# Patient Record
Sex: Female | Born: 1960
Health system: Southern US, Community
[De-identification: ages and names within clinical notes are randomized; demographics above are authoritative.]

## PROBLEM LIST (undated history)

## (undated) DIAGNOSIS — J9383 Other pneumothorax: Secondary | ICD-10-CM

## (undated) DIAGNOSIS — I10 Essential (primary) hypertension: Secondary | ICD-10-CM

## (undated) DIAGNOSIS — N390 Urinary tract infection, site not specified: Secondary | ICD-10-CM

## (undated) DIAGNOSIS — K219 Gastro-esophageal reflux disease without esophagitis: Secondary | ICD-10-CM

## (undated) DIAGNOSIS — M1990 Unspecified osteoarthritis, unspecified site: Secondary | ICD-10-CM

## (undated) DIAGNOSIS — F419 Anxiety disorder, unspecified: Secondary | ICD-10-CM

## (undated) HISTORY — PX: ABDOMINAL HYSTERECTOMY: SHX81

## (undated) HISTORY — PX: TUBAL LIGATION: SHX77

## (undated) HISTORY — DX: Essential (primary) hypertension: I10

## (undated) HISTORY — DX: Urinary tract infection, site not specified: N39.0

## (undated) HISTORY — DX: Anxiety disorder, unspecified: F41.9

## (undated) HISTORY — PX: BREAST SURGERY: SHX581

## (undated) HISTORY — PX: REDUCTION MAMMAPLASTY: SUR839

## (undated) HISTORY — DX: Other pneumothorax: J93.83

---

## 1995-06-06 HISTORY — PX: LUNG SURGERY: SHX703

## 2013-04-21 ENCOUNTER — Ambulatory Visit (INDEPENDENT_AMBULATORY_CARE_PROVIDER_SITE_OTHER): Payer: Self-pay | Admitting: Internal Medicine

## 2013-04-21 ENCOUNTER — Encounter: Payer: Self-pay | Admitting: Internal Medicine

## 2013-04-21 VITALS — BP 140/90 | HR 80 | Temp 97.9°F | Resp 16 | Ht 66.0 in | Wt 180.0 lb

## 2013-04-21 DIAGNOSIS — J9383 Other pneumothorax: Secondary | ICD-10-CM | POA: Insufficient documentation

## 2013-04-21 DIAGNOSIS — I10 Essential (primary) hypertension: Secondary | ICD-10-CM | POA: Insufficient documentation

## 2013-04-21 MED ORDER — TRIAMTERENE-HCTZ 37.5-25 MG PO CAPS: 1.0000 | ORAL_CAPSULE | Freq: Every day | ORAL | Status: DC

## 2013-04-21 MED ORDER — NORTRIPTYLINE HCL 10 MG PO CAPS: 10.0000 mg | ORAL_CAPSULE | Freq: Every day | ORAL | Status: DC

## 2013-04-21 MED ORDER — VITAMIN D 1000 UNITS PO TABS: 1000.0000 [IU] | ORAL_TABLET | Freq: Every day | ORAL | Status: AC

## 2013-04-21 NOTE — Assessment & Plan Note (Signed)
Chronic  Change Maxzide to 1 po qd

## 2013-04-21 NOTE — Progress Notes (Signed)
  Subjective:     HPI   The patient needs to address  chronic hypertension that has been well controlled with medicines Had a colonoscopy in 1999  Review of Systems  Constitutional: Negative for fever, chills, diaphoresis, activity change, appetite change, fatigue and unexpected weight change.  HENT: Negative for congestion, dental problem, ear pain, hearing loss, mouth sores, postnasal drip, sinus pressure, sneezing, sore throat and voice change.   Eyes: Negative for pain and visual disturbance.  Respiratory: Positive for cough. Negative for chest tightness, wheezing and stridor.   Cardiovascular: Negative for chest pain, palpitations and leg swelling.  Gastrointestinal: Negative for nausea, vomiting, abdominal pain, blood in stool, abdominal distention and rectal pain.  Genitourinary: Negative for dysuria, hematuria, decreased urine volume, vaginal bleeding, vaginal discharge, difficulty urinating, vaginal pain and menstrual problem.  Musculoskeletal: Negative for back pain, gait problem, joint swelling and neck pain.  Skin: Negative for color change, rash and wound.  Neurological: Negative for dizziness, tremors, syncope, speech difficulty and light-headedness.  Hematological: Negative for adenopathy.  Psychiatric/Behavioral: Positive for sleep disturbance. Negative for suicidal ideas, hallucinations, behavioral problems, confusion, dysphoric mood and decreased concentration. The patient is not nervous/anxious and is not hyperactive.        Objective:   Physical Exam  Constitutional: She appears well-developed. No distress.  HENT:  Head: Normocephalic.  Right Ear: External ear normal.  Left Ear: External ear normal.  Nose: Nose normal.  Mouth/Throat: Oropharynx is clear and moist.  Eyes: Conjunctivae are normal. Pupils are equal, round, and reactive to light. Right eye exhibits no discharge. Left eye exhibits no discharge.  Neck: Normal range of motion. Neck supple. No JVD  present. No tracheal deviation present. No thyromegaly present.  Cardiovascular: Normal rate, regular rhythm and normal heart sounds.   Pulmonary/Chest: No stridor. No respiratory distress. She has no wheezes.  Abdominal: Soft. Bowel sounds are normal. She exhibits no distension and no mass. There is no tenderness. There is no rebound and no guarding.  Musculoskeletal: She exhibits no edema and no tenderness.  Lymphadenopathy:    She has no cervical adenopathy.  Neurological: She displays normal reflexes. No cranial nerve deficit. She exhibits normal muscle tone. Coordination normal.  Skin: No rash noted. No erythema.  Psychiatric: She has a normal mood and affect. Her behavior is normal. Judgment and thought content normal.          Assessment & Plan:   Well exam/labs next year when Timira obtains health isurance

## 2013-04-21 NOTE — Assessment & Plan Note (Signed)
Juleen needs to stop smoking

## 2013-04-21 NOTE — Progress Notes (Signed)
Pre visit review using our clinic review tool, if applicable. No additional management support is needed unless otherwise documented below in the visit note. 

## 2013-08-18 ENCOUNTER — Ambulatory Visit: Payer: Self-pay | Admitting: Internal Medicine

## 2013-08-18 DIAGNOSIS — Z0289 Encounter for other administrative examinations: Secondary | ICD-10-CM

## 2013-10-23 ENCOUNTER — Other Ambulatory Visit: Payer: Self-pay | Admitting: Internal Medicine

## 2013-12-25 ENCOUNTER — Ambulatory Visit (INDEPENDENT_AMBULATORY_CARE_PROVIDER_SITE_OTHER): Payer: BC Managed Care – PPO | Admitting: Family Medicine

## 2013-12-25 ENCOUNTER — Encounter: Payer: Self-pay | Admitting: Family Medicine

## 2013-12-25 VITALS — BP 112/68 | HR 69 | Temp 98.9°F | Ht 66.0 in | Wt 178.0 lb

## 2013-12-25 DIAGNOSIS — J0100 Acute maxillary sinusitis, unspecified: Secondary | ICD-10-CM

## 2013-12-25 MED ORDER — FLUCONAZOLE 150 MG PO TABS: 150.0000 mg | ORAL_TABLET | Freq: Once | ORAL | Status: DC

## 2013-12-25 MED ORDER — AZITHROMYCIN 250 MG PO TABS: ORAL_TABLET | ORAL | Status: DC

## 2013-12-25 NOTE — Progress Notes (Signed)
   Subjective:    Patient ID: Tamara Charles, female    DOB: Nov 11, 1960, 53 y.o.   MRN: [id number]  HPI Here for 10 days of sinus pressure, PND, ST, and coughing up yellow sputum.    Review of Systems  Constitutional: Negative.   HENT: Positive for congestion, postnasal drip and sinus pressure.   Eyes: Negative.   Respiratory: Positive for cough.        Objective:   Physical Exam  Constitutional: She appears well-developed and well-nourished.  HENT:  Right Ear: External ear normal.  Left Ear: External ear normal.  Nose: Nose normal.  Mouth/Throat: Oropharynx is clear and moist.  Eyes: Conjunctivae are normal.  Pulmonary/Chest: Effort normal and breath sounds normal.  Lymphadenopathy:    She has no cervical adenopathy.          Assessment & Plan:  Add Mucinex

## 2013-12-25 NOTE — Progress Notes (Signed)
Pre visit review using our clinic review tool, if applicable. No additional management support is needed unless otherwise documented below in the visit note. 

## 2014-02-23 ENCOUNTER — Ambulatory Visit (INDEPENDENT_AMBULATORY_CARE_PROVIDER_SITE_OTHER)
Admission: RE | Admit: 2014-02-23 | Discharge: 2014-02-23 | Disposition: A | Discharge status: Home / Self Care | Payer: BC Managed Care – PPO | Source: Ambulatory Visit | Attending: Internal Medicine | Admitting: Internal Medicine

## 2014-02-23 ENCOUNTER — Ambulatory Visit (INDEPENDENT_AMBULATORY_CARE_PROVIDER_SITE_OTHER): Payer: BC Managed Care – PPO | Admitting: Internal Medicine

## 2014-02-23 ENCOUNTER — Encounter: Payer: Self-pay | Admitting: Internal Medicine

## 2014-02-23 VITALS — BP 130/78 | HR 77 | Temp 97.8°F | Ht 67.0 in | Wt 177.0 lb

## 2014-02-23 DIAGNOSIS — R011 Cardiac murmur, unspecified: Secondary | ICD-10-CM

## 2014-02-23 DIAGNOSIS — Z0000 Encounter for general adult medical examination without abnormal findings: Secondary | ICD-10-CM | POA: Insufficient documentation

## 2014-02-23 DIAGNOSIS — Z1239 Encounter for other screening for malignant neoplasm of breast: Secondary | ICD-10-CM

## 2014-02-23 DIAGNOSIS — J439 Emphysema, unspecified: Secondary | ICD-10-CM

## 2014-02-23 DIAGNOSIS — F17200 Nicotine dependence, unspecified, uncomplicated: Secondary | ICD-10-CM | POA: Insufficient documentation

## 2014-02-23 DIAGNOSIS — I10 Essential (primary) hypertension: Secondary | ICD-10-CM

## 2014-02-23 DIAGNOSIS — Z8249 Family history of ischemic heart disease and other diseases of the circulatory system: Secondary | ICD-10-CM

## 2014-02-23 MED ORDER — ASPIRIN EC 81 MG PO TBEC: 81.0000 mg | DELAYED_RELEASE_TABLET | Freq: Every day | ORAL | Status: DC

## 2014-02-23 MED ORDER — TRIAMTERENE-HCTZ 37.5-25 MG PO CAPS: 1.0000 | ORAL_CAPSULE | Freq: Every day | ORAL | Status: DC

## 2014-02-23 MED ORDER — NORTRIPTYLINE HCL 10 MG PO CAPS: ORAL_CAPSULE | ORAL | Status: DC

## 2014-02-23 NOTE — Progress Notes (Signed)
Pre visit review using our clinic review tool, if applicable. No additional management support is needed unless otherwise documented below in the visit note. 

## 2014-02-23 NOTE — Progress Notes (Signed)
   Subjective:    HPI    The patient is here for a wellness exam. The patient has been doing well overall without major physical or psychological issues going on lately.  .Review of Systems  Constitutional: Negative for chills, diaphoresis, activity change, appetite change, fatigue and unexpected weight change.  HENT: Negative for congestion, mouth sores and sinus pressure.   Eyes: Negative for visual disturbance.  Respiratory: Negative for cough, chest tightness, shortness of breath and wheezing.   Cardiovascular: Negative for chest pain and palpitations.  Gastrointestinal: Negative for nausea, vomiting, abdominal pain and diarrhea.  Genitourinary: Negative for dysuria, frequency, difficulty urinating and vaginal pain.  Musculoskeletal: Negative for back pain, gait problem and myalgias.  Skin: Negative for pallor and rash.  Neurological: Negative for dizziness, tremors, weakness, numbness and headaches.  Psychiatric/Behavioral: Negative for suicidal ideas, confusion, sleep disturbance and dysphoric mood.       Objective:   Physical Exam  Constitutional: She appears well-developed. No distress.  HENT:  Head: Normocephalic.  Right Ear: External ear normal.  Left Ear: External ear normal.  Nose: Nose normal.  Mouth/Throat: Oropharynx is clear and moist.  Eyes: Conjunctivae are normal. Pupils are equal, round, and reactive to light. Right eye exhibits no discharge. Left eye exhibits no discharge.  Neck: Normal range of motion. Neck supple. No JVD present. No tracheal deviation present. No thyromegaly present.  Cardiovascular: Normal rate, regular rhythm and normal heart sounds.   Pulmonary/Chest: No stridor. No respiratory distress. She has no wheezes.  Abdominal: Soft. Bowel sounds are normal. She exhibits no distension and no mass. There is no tenderness. There is no rebound and no guarding.  Musculoskeletal: She exhibits no edema and no tenderness.  Lymphadenopathy:    She  has no cervical adenopathy.  Neurological: She displays normal reflexes. No cranial nerve deficit. She exhibits normal muscle tone. Coordination normal.  Skin: No rash noted. No erythema.  Psychiatric: She has a normal mood and affect. Her behavior is normal. Judgment and thought content normal.   1/6 heart murmur    Assessment & Plan:

## 2014-02-23 NOTE — Assessment & Plan Note (Signed)
Discussed Chantix info

## 2014-02-23 NOTE — Assessment & Plan Note (Signed)
Continue with current prescription therapy as reflected on the Med list.  

## 2014-02-23 NOTE — Assessment & Plan Note (Signed)
Brother died of MI Smoking discussed ASA 81 mg Labs

## 2014-02-23 NOTE — Assessment & Plan Note (Addendum)
We discussed age appropriate health related issues, including available/recomended screening tests and vaccinations. We discussed a need for adhering to healthy diet and exercise. Labs/EKG were reviewed/ordered. All questions were answered. EKG Colonoscopy vs Cologuard discussed Mammogram Pt declined flu shot

## 2014-02-23 NOTE — Assessment & Plan Note (Addendum)
Remote H/o surgery CXR

## 2014-03-02 ENCOUNTER — Ambulatory Visit (HOSPITAL_COMMUNITY): Payer: BC Managed Care – PPO | Attending: Internal Medicine | Admitting: Radiology

## 2014-03-02 DIAGNOSIS — I10 Essential (primary) hypertension: Secondary | ICD-10-CM | POA: Diagnosis not present

## 2014-03-02 DIAGNOSIS — Z8249 Family history of ischemic heart disease and other diseases of the circulatory system: Secondary | ICD-10-CM

## 2014-03-02 DIAGNOSIS — R011 Cardiac murmur, unspecified: Secondary | ICD-10-CM | POA: Diagnosis not present

## 2014-03-02 DIAGNOSIS — F17200 Nicotine dependence, unspecified, uncomplicated: Secondary | ICD-10-CM | POA: Diagnosis not present

## 2014-03-02 NOTE — Progress Notes (Signed)
Echocardiogram performed.  

## 2014-03-03 ENCOUNTER — Other Ambulatory Visit: Payer: Self-pay | Admitting: Internal Medicine

## 2014-03-03 ENCOUNTER — Ambulatory Visit: Admission: RE | Admit: 2014-03-03 | Payer: BC Managed Care – PPO | Source: Ambulatory Visit

## 2014-03-03 ENCOUNTER — Ambulatory Visit
Admission: RE | Admit: 2014-03-03 | Discharge: 2014-03-03 | Disposition: A | Discharge status: Home / Self Care | Payer: BC Managed Care – PPO | Source: Ambulatory Visit | Attending: Internal Medicine | Admitting: Internal Medicine

## 2014-03-03 DIAGNOSIS — Z9889 Other specified postprocedural states: Secondary | ICD-10-CM

## 2014-03-03 DIAGNOSIS — Z1231 Encounter for screening mammogram for malignant neoplasm of breast: Secondary | ICD-10-CM

## 2014-03-05 ENCOUNTER — Other Ambulatory Visit: Payer: Self-pay | Admitting: Internal Medicine

## 2014-03-05 DIAGNOSIS — R928 Other abnormal and inconclusive findings on diagnostic imaging of breast: Secondary | ICD-10-CM

## 2014-03-09 ENCOUNTER — Telehealth: Payer: Self-pay | Admitting: Internal Medicine

## 2014-03-09 ENCOUNTER — Ambulatory Visit (INDEPENDENT_AMBULATORY_CARE_PROVIDER_SITE_OTHER): Payer: BC Managed Care – PPO | Admitting: Internal Medicine

## 2014-03-09 ENCOUNTER — Ambulatory Visit: Payer: BC Managed Care – PPO

## 2014-03-09 ENCOUNTER — Encounter: Payer: Self-pay | Admitting: Internal Medicine

## 2014-03-09 VITALS — BP 118/82 | HR 66 | Temp 98.2°F | Resp 14 | Wt 177.0 lb

## 2014-03-09 DIAGNOSIS — B029 Zoster without complications: Secondary | ICD-10-CM

## 2014-03-09 MED ORDER — FAMCICLOVIR 500 MG PO TABS: 500.0000 mg | ORAL_TABLET | Freq: Three times a day (TID) | ORAL | Status: DC

## 2014-03-09 MED ORDER — FAMCICLOVIR 500 MG PO TABS: 1000.0000 mg | ORAL_TABLET | Freq: Three times a day (TID) | ORAL | Status: DC

## 2014-03-09 NOTE — Progress Notes (Signed)
Pre visit review using our clinic review tool, if applicable. No additional management support is needed unless otherwise documented below in the visit note. 

## 2014-03-09 NOTE — Telephone Encounter (Signed)
[facility] called and said that pt is coming in at 3:15 today and needs referral signed by 12 today in order to do the exam for pt.   Best number is [phone]

## 2014-03-09 NOTE — Telephone Encounter (Signed)
Faxed order back to breast center...Tamara Charles

## 2014-03-09 NOTE — Telephone Encounter (Signed)
Done. Thx.

## 2014-03-09 NOTE — Progress Notes (Signed)
   Subjective:    Patient ID: Tamara Charles, female    DOB: 31-May-1961, 53 y.o.   MRN: [id number]  HPI   Symptoms began 03/07/14 while [place]. She noted a single vesicle the left forearm. Subsequently additional vesicles appeared on  the upper arm & the left lateral chest in the area of the breast.  She's also noted some itching in the left eye without associated discharge or vision change.  She does have a history of severe chickenpox as a child. She has not had not had the shingles vaccine.    Review of Systems   She specifically denies fever, chills, or sweats.  There's been no purulent drainage from the eye or blurred vision, double vision, loss of vision.      Objective:   Physical Exam   Pertinent positive findings include: She has several small vesicles over the left ventral arm There are also several small papules over the left lateral breast area.  Pupils equally round reactive to light. Extraocular motion is intact.  She has no lymphadenopathy about the neck , axilla, or epitrochlear areas Regular rhythm without murmurs or gallops Chest is clear with no increased work of breathing.        Assessment & Plan:  #1 thoracic herpes zoster #2 allergic conjunctivitis See orders and after visit summary  The pathophysiology of herpes zoster infection was discussed. Restrictions were also delineated. Medications prescribed and indications discussed.

## 2014-03-09 NOTE — Patient Instructions (Signed)
Please avoid direct exposure to individuals who are immunosuppressed such as cancer patient on chemotherapy or HIV patients.  After having herpes zoster or shingles;  a natural immunity against recurrent shingles should be present for approximately 24 months. After that time shingles immunization would be indicated. I

## 2014-03-09 NOTE — Telephone Encounter (Signed)
Dr. Alain Marion you have to go in and sign the order for ultrasound to be done...Tamara Charles

## 2014-03-09 NOTE — Telephone Encounter (Signed)
Ok ref [employer]

## 2014-03-09 NOTE — Telephone Encounter (Signed)
[place] or a US biopsy? Thx

## 2014-03-09 NOTE — Telephone Encounter (Signed)
Called the breast center to clarify order that md need to sign. Spoke with Noell she stated they fax over md a order to have diagnostic mammogram & ultrasound. Will check with stacey to check fax's from last week...Johny Chess

## 2014-03-10 ENCOUNTER — Ambulatory Visit
Admission: RE | Admit: 2014-03-10 | Discharge: 2014-03-10 | Disposition: A | Discharge status: Home / Self Care | Payer: BC Managed Care – PPO | Source: Ambulatory Visit | Attending: Internal Medicine | Admitting: Internal Medicine

## 2014-03-10 ENCOUNTER — Telehealth: Payer: Self-pay | Admitting: Internal Medicine

## 2014-03-10 DIAGNOSIS — R928 Other abnormal and inconclusive findings on diagnostic imaging of breast: Secondary | ICD-10-CM

## 2014-03-10 NOTE — Telephone Encounter (Signed)
emmi mailed  °

## 2014-04-21 ENCOUNTER — Other Ambulatory Visit: Payer: Self-pay | Admitting: Internal Medicine

## 2014-08-25 ENCOUNTER — Encounter: Payer: Self-pay | Admitting: Internal Medicine

## 2014-08-25 ENCOUNTER — Other Ambulatory Visit (INDEPENDENT_AMBULATORY_CARE_PROVIDER_SITE_OTHER): Payer: BLUE CROSS/BLUE SHIELD

## 2014-08-25 ENCOUNTER — Ambulatory Visit (INDEPENDENT_AMBULATORY_CARE_PROVIDER_SITE_OTHER): Payer: BLUE CROSS/BLUE SHIELD | Admitting: Internal Medicine

## 2014-08-25 VITALS — BP 150/98 | HR 79 | Wt 180.0 lb

## 2014-08-25 DIAGNOSIS — I10 Essential (primary) hypertension: Secondary | ICD-10-CM

## 2014-08-25 DIAGNOSIS — R635 Abnormal weight gain: Secondary | ICD-10-CM | POA: Diagnosis not present

## 2014-08-25 DIAGNOSIS — N951 Menopausal and female climacteric states: Secondary | ICD-10-CM | POA: Diagnosis not present

## 2014-08-25 LAB — CBC WITH DIFFERENTIAL/PLATELET
BASOS PCT: 0.4 % (ref 0.0–3.0)
Basophils Absolute: 0 10*3/uL (ref 0.0–0.1)
EOS PCT: 2.6 % (ref 0.0–5.0)
Eosinophils Absolute: 0.2 10*3/uL (ref 0.0–0.7)
HCT: 40.8 % (ref 36.0–46.0)
Hemoglobin: 14.1 g/dL (ref 12.0–15.0)
Lymphocytes Relative: 29.8 % (ref 12.0–46.0)
Lymphs Abs: 2 10*3/uL (ref 0.7–4.0)
MCHC: 34.6 g/dL (ref 30.0–36.0)
MCV: 93.3 fl (ref 78.0–100.0)
Monocytes Absolute: 0.5 10*3/uL (ref 0.1–1.0)
Monocytes Relative: 7.2 % (ref 3.0–12.0)
Neutro Abs: 3.9 10*3/uL (ref 1.4–7.7)
Neutrophils Relative %: 60 % (ref 43.0–77.0)
PLATELETS: 357 10*3/uL (ref 150.0–400.0)
RBC: 4.38 Mil/uL (ref 3.87–5.11)
RDW: 12.6 % (ref 11.5–15.5)
WBC: 6.6 10*3/uL (ref 4.0–10.5)

## 2014-08-25 LAB — BASIC METABOLIC PANEL
BUN: 17 mg/dL (ref 6–23)
CALCIUM: 9.8 mg/dL (ref 8.4–10.5)
CO2: 32 mEq/L (ref 19–32)
Chloride: 100 mEq/L (ref 96–112)
Creatinine, Ser: 0.74 mg/dL (ref 0.40–1.20)
GFR: 87.53 mL/min (ref >60.00)
Glucose, Bld: 103 mg/dL — ABNORMAL HIGH (ref 70–99)
Potassium: 3.7 mEq/L (ref 3.5–5.1)
SODIUM: 136 meq/L (ref 135–145)

## 2014-08-25 LAB — URINALYSIS, ROUTINE W REFLEX MICROSCOPIC
Bilirubin Urine: NEGATIVE
HGB URINE DIPSTICK: NEGATIVE
Ketones, ur: NEGATIVE
LEUKOCYTES UA: NEGATIVE
NITRITE: NEGATIVE
PH: 6 (ref 5.0–8.0)
RBC / HPF: NONE SEEN (ref 0–?)
SPECIFIC GRAVITY, URINE: 1.025 (ref 1.000–1.030)
TOTAL PROTEIN, URINE-UPE24: NEGATIVE
URINE GLUCOSE: NEGATIVE
Urobilinogen, UA: 0.2 (ref 0.0–1.0)

## 2014-08-25 LAB — FOLLICLE STIMULATING HORMONE: FSH: 60.5 m[IU]/mL

## 2014-08-25 LAB — HEPATIC FUNCTION PANEL
ALBUMIN: 4.5 g/dL (ref 3.5–5.2)
ALT: 13 U/L (ref 0–35)
AST: 15 U/L (ref 0–37)
Alkaline Phosphatase: 51 U/L (ref 39–117)
BILIRUBIN TOTAL: 0.4 mg/dL (ref 0.2–1.2)
Bilirubin, Direct: 0 mg/dL (ref 0.0–0.3)
TOTAL PROTEIN: 7.1 g/dL (ref 6.0–8.3)

## 2014-08-25 LAB — LIPID PANEL
CHOLESTEROL: 234 mg/dL — AB (ref 0–200)
HDL: 47.2 mg/dL (ref >39.00)
NONHDL: 186.8
Total CHOL/HDL Ratio: 5
Triglycerides: 333 mg/dL — ABNORMAL HIGH (ref 0.0–149.0)
VLDL: 66.6 mg/dL — ABNORMAL HIGH (ref 0.0–40.0)

## 2014-08-25 LAB — TSH: TSH: 0.79 u[IU]/mL (ref 0.35–4.50)

## 2014-08-25 LAB — LDL CHOLESTEROL, DIRECT: Direct LDL: 146 mg/dL

## 2014-08-25 LAB — VITAMIN B12: Vitamin B-12: 717 pg/mL (ref 211–911)

## 2014-08-25 MED ORDER — VENLAFAXINE HCL ER 37.5 MG PO CP24: 37.5000 mg | ORAL_CAPSULE | Freq: Every day | ORAL | Status: DC

## 2014-08-25 NOTE — Assessment & Plan Note (Signed)
Chronic  On Dyazide Monitor BP

## 2014-08-25 NOTE — Progress Notes (Signed)
   Subjective:    Patient ID: Tamara Charles, female    DOB: September 30, 1960, 54 y.o.   MRN: [id number]  HPI  C/o bad hot flashes x 1 year - worse C/o wt gain  LMP 2010 - partial hysterectomy  BP Readings from Last 3 Encounters:  08/25/14 150/98  03/09/14 118/82  02/23/14 130/78   Wt Readings from Last 3 Encounters:  08/25/14 180 lb (81.647 kg)  03/09/14 177 lb (80.287 kg)  02/23/14 177 lb (80.287 kg)      Review of Systems  Constitutional: Positive for diaphoresis. Negative for chills, activity change, appetite change, fatigue and unexpected weight change.  HENT: Negative for congestion, mouth sores and sinus pressure.   Eyes: Negative for visual disturbance.  Respiratory: Negative for cough and chest tightness.   Cardiovascular: Negative for chest pain, palpitations and leg swelling.  Gastrointestinal: Negative for nausea, abdominal pain and blood in stool.  Genitourinary: Negative for frequency, difficulty urinating and vaginal pain.  Musculoskeletal: Negative for back pain and gait problem.  Skin: Negative for pallor and rash.  Neurological: Negative for dizziness, tremors, weakness, numbness and headaches.  Psychiatric/Behavioral: Negative for confusion and sleep disturbance. The patient is not nervous/anxious.        Objective:   Physical Exam  Constitutional: She appears well-developed. No distress.  HENT:  Head: Normocephalic.  Right Ear: External ear normal.  Left Ear: External ear normal.  Nose: Nose normal.  Mouth/Throat: Oropharynx is clear and moist.  Eyes: Conjunctivae are normal. Pupils are equal, round, and reactive to light. Right eye exhibits no discharge. Left eye exhibits no discharge.  Neck: Normal range of motion. Neck supple. No JVD present. No tracheal deviation present. No thyromegaly present.  Cardiovascular: Normal rate, regular rhythm and normal heart sounds.   Pulmonary/Chest: No stridor. No respiratory distress. She has no wheezes.    Abdominal: Soft. Bowel sounds are normal. She exhibits no distension and no mass. There is no tenderness. There is no rebound and no guarding.  Musculoskeletal: She exhibits no edema or tenderness.  Lymphadenopathy:    She has no cervical adenopathy.  Neurological: She displays normal reflexes. No cranial nerve deficit. She exhibits normal muscle tone. Coordination normal.  Skin: No rash noted. No erythema.  Psychiatric: She has a normal mood and affect. Her behavior is normal. Judgment and thought content normal.   Lab Results  Component Value Date   WBC 6.6 08/25/2014   HGB 14.1 08/25/2014   HCT 40.8 08/25/2014   PLT 357.0 08/25/2014   GLUCOSE 103* 08/25/2014   CHOL 234* 08/25/2014   TRIG 333.0* 08/25/2014   HDL 47.20 08/25/2014   LDLDIRECT 146.0 08/25/2014   ALT 13 08/25/2014   AST 15 08/25/2014   NA 136 08/25/2014   K 3.7 08/25/2014   CL 100 08/25/2014   CREATININE 0.74 08/25/2014   BUN 17 08/25/2014   CO2 32 08/25/2014   TSH 0.79 08/25/2014          Assessment & Plan:

## 2014-08-25 NOTE — Assessment & Plan Note (Signed)
Discussed diet Labs 

## 2014-08-25 NOTE — Assessment & Plan Note (Addendum)
Options discussed - HRT vs Effexor vs Clonidine Will start Effexor

## 2014-08-25 NOTE — Progress Notes (Deleted)
   Subjective:    HPI  C/o wt gain C/o hot flashes x 12 mo  LMP 2010 - partial hysterectomy   BP Readings from Last 3 Encounters:  08/25/14 150/98  03/09/14 118/82  02/23/14 130/78   Wt Readings from Last 3 Encounters:  08/25/14 180 lb (81.647 kg)  03/09/14 177 lb (80.287 kg)  02/23/14 177 lb (80.287 kg)      .Review of Systems  Constitutional: Negative for chills, diaphoresis, activity change, appetite change, fatigue and unexpected weight change.  HENT: Negative for congestion, mouth sores and sinus pressure.   Eyes: Negative for visual disturbance.  Respiratory: Negative for cough, chest tightness, shortness of breath and wheezing.   Cardiovascular: Negative for chest pain and palpitations.  Gastrointestinal: Negative for nausea, vomiting, abdominal pain and diarrhea.  Genitourinary: Negative for dysuria, frequency, difficulty urinating and vaginal pain.  Musculoskeletal: Negative for myalgias, back pain and gait problem.  Skin: Negative for pallor and rash.  Neurological: Negative for dizziness, tremors, weakness, numbness and headaches.  Psychiatric/Behavioral: Negative for suicidal ideas, confusion, sleep disturbance and dysphoric mood.       Objective:   Physical Exam  Constitutional: She appears well-developed. No distress.  HENT:  Head: Normocephalic.  Right Ear: External ear normal.  Left Ear: External ear normal.  Nose: Nose normal.  Mouth/Throat: Oropharynx is clear and moist.  Eyes: Conjunctivae are normal. Pupils are equal, round, and reactive to light. Right eye exhibits no discharge. Left eye exhibits no discharge.  Neck: Normal range of motion. Neck supple. No JVD present. No tracheal deviation present. No thyromegaly present.  Cardiovascular: Normal rate, regular rhythm and normal heart sounds.   Pulmonary/Chest: No stridor. No respiratory distress. She has no wheezes.  Abdominal: Soft. Bowel sounds are normal. She exhibits no distension and no  mass. There is no tenderness. There is no rebound and no guarding.  Musculoskeletal: She exhibits no edema or tenderness.  Lymphadenopathy:    She has no cervical adenopathy.  Neurological: She displays normal reflexes. No cranial nerve deficit. She exhibits normal muscle tone. Coordination normal.  Skin: No rash noted. No erythema.  Psychiatric: She has a normal mood and affect. Her behavior is normal. Judgment and thought content normal.    Subjective:   No results found for: WBC, HGB, HCT, PLT, GLUCOSE, CHOL, TRIG, HDL, LDLDIRECT, LDLCALC, ALT, AST, NA, K, CL, CREATININE, BUN, CO2, TSH, PSA, INR, GLUF, HGBA1C, MICROALBUR      Objective:     1/6 heart murmur    Assessment & Plan:       Assessment & Plan:

## 2014-10-05 ENCOUNTER — Ambulatory Visit: Payer: BLUE CROSS/BLUE SHIELD | Admitting: Internal Medicine

## 2014-10-18 ENCOUNTER — Other Ambulatory Visit: Payer: Self-pay | Admitting: Internal Medicine

## 2015-01-22 ENCOUNTER — Telehealth: Payer: Self-pay | Admitting: *Deleted

## 2015-01-22 MED ORDER — NORTRIPTYLINE HCL 10 MG PO CAPS: 10.0000 mg | ORAL_CAPSULE | Freq: Every day | ORAL | Status: DC

## 2015-01-22 MED ORDER — VENLAFAXINE HCL ER 37.5 MG PO CP24
37.5000 mg | ORAL_CAPSULE | Freq: Every day | ORAL | Status: DC
Start: 2015-01-22 — End: 2015-02-22

## 2015-01-22 NOTE — Telephone Encounter (Signed)
Rx sent. See meds.  

## 2015-01-22 NOTE — Telephone Encounter (Signed)
[place] with 2 ref Thx

## 2015-01-22 NOTE — Telephone Encounter (Signed)
Rf req for Nortriptyline 10 mg 1 po qhs. 90 day supply. Ok to Rf?

## 2015-02-11 ENCOUNTER — Other Ambulatory Visit: Payer: Self-pay | Admitting: Internal Medicine

## 2015-02-22 ENCOUNTER — Ambulatory Visit (INDEPENDENT_AMBULATORY_CARE_PROVIDER_SITE_OTHER): Payer: BLUE CROSS/BLUE SHIELD | Admitting: Internal Medicine

## 2015-02-22 ENCOUNTER — Encounter: Payer: Self-pay | Admitting: Internal Medicine

## 2015-02-22 VITALS — BP 110/70 | HR 79 | Wt 172.0 lb

## 2015-02-22 DIAGNOSIS — N951 Menopausal and female climacteric states: Secondary | ICD-10-CM

## 2015-02-22 DIAGNOSIS — J449 Chronic obstructive pulmonary disease, unspecified: Secondary | ICD-10-CM

## 2015-02-22 DIAGNOSIS — I10 Essential (primary) hypertension: Secondary | ICD-10-CM

## 2015-02-22 DIAGNOSIS — J439 Emphysema, unspecified: Secondary | ICD-10-CM

## 2015-02-22 DIAGNOSIS — Z720 Tobacco use: Secondary | ICD-10-CM | POA: Diagnosis not present

## 2015-02-22 DIAGNOSIS — F17200 Nicotine dependence, unspecified, uncomplicated: Secondary | ICD-10-CM

## 2015-02-22 MED ORDER — VENLAFAXINE HCL ER 75 MG PO CP24: 75.0000 mg | ORAL_CAPSULE | Freq: Every day | ORAL | Status: DC

## 2015-02-22 MED ORDER — NORTRIPTYLINE HCL 10 MG PO CAPS: 10.0000 mg | ORAL_CAPSULE | Freq: Every day | ORAL | Status: DC

## 2015-02-22 NOTE — Assessment & Plan Note (Signed)
Needs to stop smoking.

## 2015-02-22 NOTE — Progress Notes (Signed)
Pre visit review using our clinic review tool, if applicable. No additional management support is needed unless otherwise documented below in the visit note. 

## 2015-02-22 NOTE — Progress Notes (Signed)
Subjective:  Patient ID: Tamara Charles, female    DOB: 1960/10/06  Age: 54 y.o. MRN: [id number]  CC: No chief complaint on file.   HPI Tamara Charles presents for hot flashes, insomnia, HTN f/u.  Outpatient Prescriptions Prior to Visit  Medication Sig Dispense Refill  . aspirin EC 81 MG tablet Take 1 tablet (81 mg total) by mouth daily. 100 tablet 3  . famciclovir (FAMVIR) 500 MG tablet Take 1 tablet (500 mg total) by mouth 3 (three) times daily. 21 tablet 0  . naproxen sodium (ANAPROX) 220 MG tablet Take 220 mg by mouth 2 (two) times daily with a meal.    . triamterene-hydrochlorothiazide (DYAZIDE) 37.5-25 MG per capsule TAKE ONE CAPSULE BY MOUTH EVERY DAY 90 capsule 0  . nortriptyline (PAMELOR) 10 MG capsule Take 1 capsule (10 mg total) by mouth at bedtime. 90 capsule 2  . venlafaxine XR (EFFEXOR XR) 37.5 MG 24 hr capsule Take 1 capsule (37.5 mg total) by mouth daily with breakfast. 90 capsule 2   No facility-administered medications prior to visit.    ROS Review of Systems  Constitutional: Negative for chills, activity change, appetite change, fatigue and unexpected weight change.  HENT: Negative for congestion, mouth sores and sinus pressure.   Eyes: Negative for visual disturbance.  Respiratory: Negative for cough and chest tightness.   Gastrointestinal: Negative for nausea and abdominal pain.  Genitourinary: Negative for frequency, difficulty urinating and vaginal pain.  Musculoskeletal: Negative for back pain and gait problem.  Skin: Negative for pallor and rash.  Neurological: Negative for dizziness, tremors, weakness, numbness and headaches.  Psychiatric/Behavioral: Negative for suicidal ideas, confusion and sleep disturbance.    Objective:  BP 110/70 mmHg  Pulse 79  Wt 172 lb (78.019 kg)  SpO2 94%  LMP 06/05/2008  BP Readings from Last 3 Encounters:  02/22/15 110/70  08/25/14 150/98  03/09/14 118/82    Wt Readings from Last 3 Encounters:  02/22/15 172  lb (78.019 kg)  08/25/14 180 lb (81.647 kg)  03/09/14 177 lb (80.287 kg)    Physical Exam  Constitutional: She appears well-developed. No distress.  HENT:  Head: Normocephalic.  Right Ear: External ear normal.  Left Ear: External ear normal.  Nose: Nose normal.  Mouth/Throat: Oropharynx is clear and moist.  Eyes: Conjunctivae are normal. Pupils are equal, round, and reactive to light. Right eye exhibits no discharge. Left eye exhibits no discharge.  Neck: Normal range of motion. Neck supple. No JVD present. No tracheal deviation present. No thyromegaly present.  Cardiovascular: Normal rate, regular rhythm and normal heart sounds.   Pulmonary/Chest: No stridor. No respiratory distress. She has no wheezes.  Abdominal: Soft. Bowel sounds are normal. She exhibits no distension and no mass. There is no tenderness. There is no rebound and no guarding.  Musculoskeletal: She exhibits no edema or tenderness.  Lymphadenopathy:    She has no cervical adenopathy.  Neurological: She displays normal reflexes. No cranial nerve deficit. She exhibits normal muscle tone. Coordination normal.  Skin: No rash noted. No erythema.  Psychiatric: She has a normal mood and affect. Her behavior is normal. Judgment and thought content normal.    Lab Results  Component Value Date   WBC 6.6 08/25/2014   HGB 14.1 08/25/2014   HCT 40.8 08/25/2014   PLT 357.0 08/25/2014   GLUCOSE 103* 08/25/2014   CHOL 234* 08/25/2014   TRIG 333.0* 08/25/2014   HDL 47.20 08/25/2014   LDLDIRECT 146.0 08/25/2014   ALT 13 08/25/2014  AST 15 08/25/2014   NA 136 08/25/2014   K 3.7 08/25/2014   CL 100 08/25/2014   CREATININE 0.74 08/25/2014   BUN 17 08/25/2014   CO2 32 08/25/2014   TSH 0.79 08/25/2014    Mm Digital Diagnostic Unilat R  03/10/2014   CLINICAL DATA:  Screening recall for possible right breast mass.  EXAM: DIGITAL DIAGNOSTIC  RIGHT MAMMOGRAM  ULTRASOUND RIGHT BREAST  COMPARISON:  Screening mammogram dated  03/03/2014  ACR Breast Density Category c: The breast tissue is heterogeneously dense, which may obscure small masses.  FINDINGS: Spot compression CC and MLO views were performed over the upper-outer right breast demonstrating a persistent asymmetry/possible mass measuring approximately 8 mm.  Physical examination of the upper-outer right breast does not reveal any palpable masses. The patient does have faint scar seen in her right breast from a prior reduction at age 81.  Targeted ultrasound of the right breast was performed demonstrating an oval hypoechoic circumscribed mass in the right breast at 8 o'clock 5 cm from the nipple measuring 0.6 x 0.3 x 0.6 cm. This may correspond with mammography findings. The entire outer right breast was scanned with no suspicious findings seen.  IMPRESSION: Probably benign right breast mass.  RECOMMENDATION: Diagnostic mammography of the right breast in 6 months with possible right breast ultrasound to demonstrate stability of the probably benign right breast mass.  I have discussed the findings and recommendations with the patient. Results were also provided in writing at the conclusion of the visit. If applicable, a reminder letter will be sent to the patient regarding the next appointment.  BI-RADS CATEGORY  3: Probably benign.   Electronically Signed   By: Everlean Alstrom M.D.   On: 03/10/2014 14:29   US Breast Ltd Uni Right Inc Axilla  03/10/2014   CLINICAL DATA:  Screening recall for possible right breast mass.  EXAM: DIGITAL DIAGNOSTIC  RIGHT MAMMOGRAM  ULTRASOUND RIGHT BREAST  COMPARISON:  Screening mammogram dated 03/03/2014  ACR Breast Density Category c: The breast tissue is heterogeneously dense, which may obscure small masses.  FINDINGS: Spot compression CC and MLO views were performed over the upper-outer right breast demonstrating a persistent asymmetry/possible mass measuring approximately 8 mm.  Physical examination of the upper-outer right breast does not  reveal any palpable masses. The patient does have faint scar seen in her right breast from a prior reduction at age 31.  Targeted ultrasound of the right breast was performed demonstrating an oval hypoechoic circumscribed mass in the right breast at 8 o'clock 5 cm from the nipple measuring 0.6 x 0.3 x 0.6 cm. This may correspond with mammography findings. The entire outer right breast was scanned with no suspicious findings seen.  IMPRESSION: Probably benign right breast mass.  RECOMMENDATION: Diagnostic mammography of the right breast in 6 months with possible right breast ultrasound to demonstrate stability of the probably benign right breast mass.  I have discussed the findings and recommendations with the patient. Results were also provided in writing at the conclusion of the visit. If applicable, a reminder letter will be sent to the patient regarding the next appointment.  BI-RADS CATEGORY  3: Probably benign.   Electronically Signed   By: Everlean Alstrom M.D.   On: 03/10/2014 14:29    Assessment & Plan:   Diagnoses and all orders for this visit:  Essential hypertension  Bleb, lung  Tobacco use disorder  Hot flash, menopausal  Other orders -     nortriptyline (PAMELOR) 10 MG  capsule; Take 1 capsule (10 mg total) by mouth at bedtime. -     Cancel: venlafaxine XR (EFFEXOR XR) 37.5 MG 24 hr capsule; Take 1 capsule (37.5 mg total) by mouth daily with breakfast. -     venlafaxine XR (EFFEXOR XR) 75 MG 24 hr capsule; Take 1 capsule (75 mg total) by mouth daily with breakfast.  I have discontinued Ms. Sobalvarro's venlafaxine XR. I am also having her start on venlafaxine XR. Additionally, I am having her maintain her naproxen sodium, aspirin EC, famciclovir, triamterene-hydrochlorothiazide, and nortriptyline.  Meds ordered this encounter  Medications  . nortriptyline (PAMELOR) 10 MG capsule    Sig: Take 1 capsule (10 mg total) by mouth at bedtime.    Dispense:  90 capsule    Refill:  2  .  venlafaxine XR (EFFEXOR XR) 75 MG 24 hr capsule    Sig: Take 1 capsule (75 mg total) by mouth daily with breakfast.    Dispense:  90 capsule    Refill:  2     Follow-up: Return in about 6 months (around 08/22/2015) for Wellness Exam.  Walker Kehr, MD

## 2015-02-22 NOTE — Assessment & Plan Note (Signed)
Increase Effexor dose

## 2015-02-22 NOTE — Assessment & Plan Note (Signed)
On Dyazide

## 2015-04-20 ENCOUNTER — Ambulatory Visit (INDEPENDENT_AMBULATORY_CARE_PROVIDER_SITE_OTHER): Payer: BLUE CROSS/BLUE SHIELD | Admitting: Family

## 2015-04-20 ENCOUNTER — Encounter: Payer: Self-pay | Admitting: Family

## 2015-04-20 VITALS — BP 158/100 | HR 79 | Temp 97.7°F | Resp 18 | Ht 67.0 in | Wt 178.0 lb

## 2015-04-20 DIAGNOSIS — B009 Herpesviral infection, unspecified: Secondary | ICD-10-CM

## 2015-04-20 MED ORDER — VALACYCLOVIR HCL 500 MG PO TABS: 500.0000 mg | ORAL_TABLET | Freq: Every day | ORAL | Status: DC

## 2015-04-20 NOTE — Assessment & Plan Note (Signed)
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 Discussed and educated regard modes of transmission and that there is no specific cure. Would like to pursue suppression therapy. Start daily valcyclovir. Follow up in 1 month for hepatic function test. Follow up if symptoms are no longer controlled with medication.

## 2015-04-20 NOTE — Progress Notes (Signed)
   Subjective:    Patient ID: Tamara Charles, female    DOB: 1960-09-22, 54 y.o.   MRN: [id number]  Chief Complaint  Patient presents with  . Personal matter    did not want to talk to me about it would rather talk to the Provider    HPI:  Tamara Charles is a 54 y.o. female who  has a past medical history of Spontaneous pneumothorax and Hypertension. and presents today for an office follow up.  1.) Herpes - Previously diagnosed with genital herpes and has not had any breakouts since her initial diagnosis. She has most recently had an outbreak. Modifying factors include a previous prescription for acyclovir which helped to resolve her outbreak. Presents today with questions regarding transmission, treatment and suppression of outbreaks.  No Known Allergies   Current Outpatient Prescriptions on File Prior to Visit  Medication Sig Dispense Refill  . aspirin EC 81 MG tablet Take 1 tablet (81 mg total) by mouth daily. 100 tablet 3  . naproxen sodium (ANAPROX) 220 MG tablet Take 220 mg by mouth 2 (two) times daily with a meal.    . nortriptyline (PAMELOR) 10 MG capsule Take 1 capsule (10 mg total) by mouth at bedtime. 90 capsule 2  . triamterene-hydrochlorothiazide (DYAZIDE) 37.5-25 MG per capsule TAKE ONE CAPSULE BY MOUTH EVERY DAY 90 capsule 0  . venlafaxine XR (EFFEXOR XR) 75 MG 24 hr capsule Take 1 capsule (75 mg total) by mouth daily with breakfast. 90 capsule 2   No current facility-administered medications on file prior to visit.    Review of Systems  Constitutional: Negative for fever and chills.  Gastrointestinal: Negative for abdominal pain.  Genitourinary: Negative for dysuria, urgency, frequency, hematuria, vaginal bleeding, vaginal discharge, genital sores, vaginal pain, pelvic pain and dyspareunia.  Skin: Negative for rash.      Objective:    BP 158/100 mmHg  Pulse 79  Temp(Src) 97.7 F (36.5 C) (Oral)  Resp 18  Ht 5\' 7"  (1.702 m)  Wt 178 lb (80.74 kg)  BMI  27.87 kg/m2  SpO2 96%  LMP 06/05/2008 Nursing note and vital signs reviewed.  Physical Exam  Constitutional: She is oriented to person, place, and time. She appears well-developed and well-nourished. No distress.  Cardiovascular: Normal rate, regular rhythm, normal heart sounds and intact distal pulses.   Pulmonary/Chest: Effort normal and breath sounds normal.  Neurological: She is alert and oriented to person, place, and time.  Skin: Skin is warm and dry.  Psychiatric: She has a normal mood and affect. Her behavior is normal. Judgment and thought content normal.       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 Discussed and educated regard modes of transmission and that there is no specific cure. Would like to pursue suppression therapy. Start daily valcyclovir. Follow up in 1 month for hepatic function test. Follow up if symptoms are no longer controlled with medication.       Relevant Medications   valACYclovir (VALTREX) 500 MG tablet

## 2015-04-20 NOTE — Patient Instructions (Signed)
Thank you for choosing [facility].  Summary/Instructions:  Your prescription(s) have been submitted to your pharmacy or been printed and provided for you. Please take as directed and contact our office if you believe you are having problem(s) with the medication(s) or have any questions.  If your symptoms worsen or fail to improve, please contact our office for further instruction, or in case of emergency go directly to the emergency room at the closest medical facility.     

## 2015-04-20 NOTE — Progress Notes (Signed)
Pre visit review using our clinic review tool, if applicable. No additional management support is needed unless otherwise documented below in the visit note. 

## 2015-05-12 ENCOUNTER — Other Ambulatory Visit: Payer: Self-pay | Admitting: Internal Medicine

## 2015-11-19 ENCOUNTER — Other Ambulatory Visit: Payer: Self-pay | Admitting: Internal Medicine

## 2016-02-02 ENCOUNTER — Other Ambulatory Visit: Payer: Self-pay | Admitting: Family

## 2016-02-02 DIAGNOSIS — B009 Herpesviral infection, unspecified: Secondary | ICD-10-CM

## 2016-04-18 ENCOUNTER — Other Ambulatory Visit: Payer: Self-pay | Admitting: *Deleted

## 2016-04-18 DIAGNOSIS — B009 Herpesviral infection, unspecified: Secondary | ICD-10-CM

## 2016-04-18 MED ORDER — VALACYCLOVIR HCL 500 MG PO TABS: 500.0000 mg | ORAL_TABLET | Freq: Every day | ORAL | 0 refills | Status: DC

## 2016-05-06 ENCOUNTER — Other Ambulatory Visit: Payer: Self-pay | Admitting: Internal Medicine

## 2016-05-14 ENCOUNTER — Other Ambulatory Visit: Payer: Self-pay | Admitting: Internal Medicine

## 2016-06-13 ENCOUNTER — Other Ambulatory Visit: Payer: Self-pay | Admitting: Internal Medicine

## 2016-06-13 NOTE — Telephone Encounter (Signed)
Ok to Rf? OV is scheduled 06/26/16.

## 2016-06-26 ENCOUNTER — Encounter: Payer: Self-pay | Admitting: Internal Medicine

## 2016-06-26 ENCOUNTER — Ambulatory Visit (INDEPENDENT_AMBULATORY_CARE_PROVIDER_SITE_OTHER): Payer: BLUE CROSS/BLUE SHIELD | Admitting: Internal Medicine

## 2016-06-26 VITALS — BP 140/78 | HR 91 | Ht 67.0 in | Wt 160.0 lb

## 2016-06-26 DIAGNOSIS — R232 Flushing: Secondary | ICD-10-CM | POA: Diagnosis not present

## 2016-06-26 DIAGNOSIS — Z8249 Family history of ischemic heart disease and other diseases of the circulatory system: Secondary | ICD-10-CM

## 2016-06-26 DIAGNOSIS — B009 Herpesviral infection, unspecified: Secondary | ICD-10-CM

## 2016-06-26 DIAGNOSIS — Z0000 Encounter for general adult medical examination without abnormal findings: Secondary | ICD-10-CM

## 2016-06-26 MED ORDER — NORTRIPTYLINE HCL 10 MG PO CAPS: 10.0000 mg | ORAL_CAPSULE | Freq: Every day | ORAL | 2 refills | Status: DC

## 2016-06-26 MED ORDER — VENLAFAXINE HCL ER 150 MG PO CP24: 150.0000 mg | ORAL_CAPSULE | Freq: Every day | ORAL | 1 refills | Status: DC

## 2016-06-26 MED ORDER — VALACYCLOVIR HCL 500 MG PO TABS: 500.0000 mg | ORAL_TABLET | Freq: Two times a day (BID) | ORAL | 3 refills | Status: DC

## 2016-06-26 NOTE — Progress Notes (Signed)
Pre visit review using our clinic review tool, if applicable. No additional management support is needed unless otherwise documented below in the visit note. 

## 2016-06-26 NOTE — Assessment & Plan Note (Signed)
Off Adderall now 

## 2016-06-26 NOTE — Progress Notes (Signed)
Subjective:  Patient ID: Tamara Charles, female    DOB: [birth year]  Age: 56 y.o. MRN: [id number]  CC: Annual Exam   HPI Makenlee Ascheman presents for a well exam. Off adderall since 6/18. C/o hot flashes  Outpatient Medications Prior to Visit  Medication Sig Dispense Refill  . aspirin EC 81 MG tablet Take 1 tablet (81 mg total) by mouth daily. 100 tablet 3  . naproxen sodium (ANAPROX) 220 MG tablet Take 220 mg by mouth 2 (two) times daily with a meal.    . nortriptyline (PAMELOR) 10 MG capsule TAKE 1 CAPSULE (10 MG TOTAL) BY MOUTH AT BEDTIME. 90 capsule 1  . triamterene-hydrochlorothiazide (MAXZIDE-25) 37.5-25 MG tablet TAKE 1 TABLET BY MOUTH EVERY DAY 90 tablet 0  . valACYclovir (VALTREX) 500 MG tablet Take 1 tablet (500 mg total) by mouth daily. Overdue for yearly physical muat see MD for future refills 30 tablet 0  . venlafaxine XR (EFFEXOR-XR) 75 MG 24 hr capsule TAKE 1 CAPSULE (75 MG TOTAL) BY MOUTH DAILY WITH BREAKFAST. 90 capsule 1  . triamterene-hydrochlorothiazide (DYAZIDE) 37.5-25 MG capsule TAKE ONE CAPSULE BY MOUTH EVERY DAY (APPT DUE) 90 capsule 3   No facility-administered medications prior to visit.     ROS Review of Systems  Constitutional: Negative for activity change, appetite change, chills, fatigue and unexpected weight change.  HENT: Negative for congestion, mouth sores, sinus pressure and voice change.   Eyes: Negative for visual disturbance.  Respiratory: Negative for cough and chest tightness.   Gastrointestinal: Negative for abdominal pain and nausea.  Genitourinary: Negative for difficulty urinating, frequency and vaginal pain.  Musculoskeletal: Negative for back pain and gait problem.  Skin: Negative for pallor and rash.  Neurological: Negative for dizziness, tremors, weakness, numbness and headaches.  Psychiatric/Behavioral: Negative for confusion, dysphoric mood, sleep disturbance and suicidal ideas. The patient is not nervous/anxious.      Objective:  BP 140/78   Pulse 91   Ht 5\' 7"  (1.702 m)   Wt 160 lb (72.6 kg)   LMP 06/05/2008   SpO2 95%   BMI 25.06 kg/m   BP Readings from Last 3 Encounters:  06/26/16 140/78  04/20/15 (!) 158/100  02/22/15 110/70    Wt Readings from Last 3 Encounters:  06/26/16 160 lb (72.6 kg)  04/20/15 178 lb (80.7 kg)  02/22/15 172 lb (78 kg)    Physical Exam  Constitutional: She appears well-developed. No distress.  HENT:  Head: Normocephalic.  Right Ear: External ear normal.  Left Ear: External ear normal.  Nose: Nose normal.  Mouth/Throat: Oropharynx is clear and moist.  Eyes: Conjunctivae are normal. Pupils are equal, round, and reactive to light. Right eye exhibits no discharge. Left eye exhibits no discharge.  Neck: Normal range of motion. Neck supple. No JVD present. No tracheal deviation present. No thyromegaly present.  Cardiovascular: Normal rate, regular rhythm and normal heart sounds.   Pulmonary/Chest: No stridor. No respiratory distress. She has no wheezes.  Abdominal: Soft. Bowel sounds are normal. She exhibits no distension and no mass. There is no tenderness. There is no rebound and no guarding.  Musculoskeletal: She exhibits no edema or tenderness.  Lymphadenopathy:    She has no cervical adenopathy.  Neurological: She displays normal reflexes. No cranial nerve deficit. She exhibits normal muscle tone. Coordination normal.  Skin: No rash noted. No erythema.  Psychiatric: She has a normal mood and affect. Her behavior is normal. Judgment and thought content normal.    Lab Results  Component  Value Date   WBC 6.6 08/25/2014   HGB 14.1 08/25/2014   HCT 40.8 08/25/2014   PLT 357.0 08/25/2014   GLUCOSE 103 (H) 08/25/2014   CHOL 234 (H) 08/25/2014   TRIG 333.0 (H) 08/25/2014   HDL 47.20 08/25/2014   LDLDIRECT 146.0 08/25/2014   ALT 13 08/25/2014   AST 15 08/25/2014   NA 136 08/25/2014   K 3.7 08/25/2014   CL 100 08/25/2014   CREATININE 0.74 08/25/2014    BUN 17 08/25/2014   CO2 32 08/25/2014   TSH 0.79 08/25/2014    Mm Digital Diagnostic Unilat R  Result Date: 03/10/2014 CLINICAL DATA:  Screening recall for possible right breast mass. EXAM: DIGITAL DIAGNOSTIC  RIGHT MAMMOGRAM ULTRASOUND RIGHT BREAST COMPARISON:  Screening mammogram dated 03/03/2014 ACR Breast Density Category c: The breast tissue is heterogeneously dense, which may obscure small masses. FINDINGS: Spot compression CC and MLO views were performed over the upper-outer right breast demonstrating a persistent asymmetry/possible mass measuring approximately 8 mm. Physical examination of the upper-outer right breast does not reveal any palpable masses. The patient does have faint scar seen in her right breast from a prior reduction at age 56. Targeted ultrasound of the right breast was performed demonstrating an oval hypoechoic circumscribed mass in the right breast at 8 o'clock 5 cm from the nipple measuring 0.6 x 0.3 x 0.6 cm. This may correspond with mammography findings. The entire outer right breast was scanned with no suspicious findings seen. IMPRESSION: Probably benign right breast mass. RECOMMENDATION: Diagnostic mammography of the right breast in 6 months with possible right breast ultrasound to demonstrate stability of the probably benign right breast mass. I have discussed the findings and recommendations with the patient. Results were also provided in writing at the conclusion of the visit. If applicable, a reminder letter will be sent to the patient regarding the next appointment. BI-RADS CATEGORY  3: Probably benign. Electronically Signed   By: Everlean Alstrom M.D.   On: 03/10/2014 14:29   US Breast Ltd Uni Right Inc Axilla  Result Date: 03/10/2014 CLINICAL DATA:  Screening recall for possible right breast mass. EXAM: DIGITAL DIAGNOSTIC  RIGHT MAMMOGRAM ULTRASOUND RIGHT BREAST COMPARISON:  Screening mammogram dated 03/03/2014 ACR Breast Density Category c: The breast tissue is  heterogeneously dense, which may obscure small masses. FINDINGS: Spot compression CC and MLO views were performed over the upper-outer right breast demonstrating a persistent asymmetry/possible mass measuring approximately 8 mm. Physical examination of the upper-outer right breast does not reveal any palpable masses. The patient does have faint scar seen in her right breast from a prior reduction at age 78. Targeted ultrasound of the right breast was performed demonstrating an oval hypoechoic circumscribed mass in the right breast at 8 o'clock 5 cm from the nipple measuring 0.6 x 0.3 x 0.6 cm. This may correspond with mammography findings. The entire outer right breast was scanned with no suspicious findings seen. IMPRESSION: Probably benign right breast mass. RECOMMENDATION: Diagnostic mammography of the right breast in 6 months with possible right breast ultrasound to demonstrate stability of the probably benign right breast mass. I have discussed the findings and recommendations with the patient. Results were also provided in writing at the conclusion of the visit. If applicable, a reminder letter will be sent to the patient regarding the next appointment. BI-RADS CATEGORY  3: Probably benign. Electronically Signed   By: Everlean Alstrom M.D.   On: 03/10/2014 14:29    Assessment & Plan:   Jackee was  seen today for annual exam.  Diagnoses and all orders for this visit:  Herpes simplex -     valACYclovir (VALTREX) 500 MG tablet; Take 1 tablet (500 mg total) by mouth daily.  Other orders -     nortriptyline (PAMELOR) 10 MG capsule; Take 1 capsule (10 mg total) by mouth at bedtime.   I am having Ms. Perrier maintain her naproxen sodium, aspirin EC, venlafaxine XR, valACYclovir, triamterene-hydrochlorothiazide, and nortriptyline.  No orders of the defined types were placed in this encounter.    Follow-up: No Follow-up on file.  Walker Kehr, MD

## 2016-06-26 NOTE — Assessment & Plan Note (Signed)
Declined HRT Increase Effexor to 150 mg/d

## 2016-06-27 ENCOUNTER — Other Ambulatory Visit (INDEPENDENT_AMBULATORY_CARE_PROVIDER_SITE_OTHER): Payer: BLUE CROSS/BLUE SHIELD

## 2016-06-27 DIAGNOSIS — Z0000 Encounter for general adult medical examination without abnormal findings: Secondary | ICD-10-CM

## 2016-06-27 LAB — URINALYSIS, ROUTINE W REFLEX MICROSCOPIC
BILIRUBIN URINE: NEGATIVE
Ketones, ur: NEGATIVE
NITRITE: NEGATIVE
Specific Gravity, Urine: 1.015 (ref 1.000–1.030)
Total Protein, Urine: NEGATIVE
Urine Glucose: NEGATIVE
Urobilinogen, UA: 0.2 (ref 0.0–1.0)
pH: 7.5 (ref 5.0–8.0)

## 2016-06-27 LAB — CBC WITH DIFFERENTIAL/PLATELET
Basophils Absolute: 0 10*3/uL (ref 0.0–0.1)
Basophils Relative: 0.5 % (ref 0.0–3.0)
EOS PCT: 0.1 % (ref 0.0–5.0)
Eosinophils Absolute: 0 10*3/uL (ref 0.0–0.7)
HCT: 42.6 % (ref 36.0–46.0)
Hemoglobin: 14.8 g/dL (ref 12.0–15.0)
LYMPHS ABS: 1.8 10*3/uL (ref 0.7–4.0)
Lymphocytes Relative: 35.4 % (ref 12.0–46.0)
MCHC: 34.8 g/dL (ref 30.0–36.0)
MCV: 95.6 fl (ref 78.0–100.0)
MONOS PCT: 8.1 % (ref 3.0–12.0)
Monocytes Absolute: 0.4 10*3/uL (ref 0.1–1.0)
NEUTROS ABS: 2.8 10*3/uL (ref 1.4–7.7)
NEUTROS PCT: 55.9 % (ref 43.0–77.0)
Platelets: 300 10*3/uL (ref 150.0–400.0)
RBC: 4.46 Mil/uL (ref 3.87–5.11)
RDW: 12.4 % (ref 11.5–15.5)
WBC: 5 10*3/uL (ref 4.0–10.5)

## 2016-06-27 LAB — HEPATIC FUNCTION PANEL
ALBUMIN: 4.6 g/dL (ref 3.5–5.2)
ALT: 17 U/L (ref 0–35)
AST: 20 U/L (ref 0–37)
Alkaline Phosphatase: 48 U/L (ref 39–117)
BILIRUBIN DIRECT: 0.1 mg/dL (ref 0.0–0.3)
TOTAL PROTEIN: 7.1 g/dL (ref 6.0–8.3)
Total Bilirubin: 0.6 mg/dL (ref 0.2–1.2)

## 2016-06-27 LAB — LIPID PANEL
CHOL/HDL RATIO: 4
Cholesterol: 229 mg/dL — ABNORMAL HIGH (ref 0–200)
HDL: 57.3 mg/dL (ref >39.00)
LDL Cholesterol: 144 mg/dL — ABNORMAL HIGH (ref 0–99)
NONHDL: 172.17
Triglycerides: 143 mg/dL (ref 0.0–149.0)
VLDL: 28.6 mg/dL (ref 0.0–40.0)

## 2016-06-27 LAB — BASIC METABOLIC PANEL
BUN: 16 mg/dL (ref 6–23)
CHLORIDE: 103 meq/L (ref 96–112)
CO2: 28 meq/L (ref 19–32)
Calcium: 9.6 mg/dL (ref 8.4–10.5)
Creatinine, Ser: 0.68 mg/dL (ref 0.40–1.20)
GFR: 95.83 mL/min (ref >60.00)
GLUCOSE: 95 mg/dL (ref 70–99)
POTASSIUM: 4.3 meq/L (ref 3.5–5.1)
SODIUM: 137 meq/L (ref 135–145)

## 2016-06-27 LAB — HEPATITIS C ANTIBODY: HCV Ab: NEGATIVE

## 2016-06-27 LAB — TSH: TSH: 0.5 u[IU]/mL (ref 0.35–4.50)

## 2016-06-29 ENCOUNTER — Other Ambulatory Visit: Payer: Self-pay | Admitting: Internal Medicine

## 2016-06-29 MED ORDER — CIPROFLOXACIN HCL 250 MG PO TABS: 250.0000 mg | ORAL_TABLET | Freq: Two times a day (BID) | ORAL | 0 refills | Status: DC

## 2016-07-10 ENCOUNTER — Telehealth: Payer: Self-pay | Admitting: Internal Medicine

## 2016-07-10 NOTE — Telephone Encounter (Signed)
Ok w/me Thx 

## 2016-07-10 NOTE — Telephone Encounter (Signed)
Pt request to transfer from Dr. Camila Li to Dr. Sharlet Salina. Please advise.

## 2016-07-11 NOTE — Telephone Encounter (Signed)
Called patient and informed her that Dr Sharlet Salina isnt excepting new patients at this time. Thanks.

## 2016-07-11 NOTE — Telephone Encounter (Signed)
Please help call pt.  

## 2016-07-11 NOTE — Telephone Encounter (Signed)
Unfortunately I am not taking new patients right now.  

## 2016-08-06 ENCOUNTER — Other Ambulatory Visit: Payer: Self-pay | Admitting: Internal Medicine

## 2016-08-11 ENCOUNTER — Telehealth: Payer: Self-pay

## 2016-08-11 DIAGNOSIS — Z1239 Encounter for other screening for malignant neoplasm of breast: Secondary | ICD-10-CM

## 2016-08-11 DIAGNOSIS — Z9889 Other specified postprocedural states: Secondary | ICD-10-CM

## 2016-08-11 DIAGNOSIS — R928 Other abnormal and inconclusive findings on diagnostic imaging of breast: Secondary | ICD-10-CM

## 2016-08-11 NOTE — Telephone Encounter (Signed)
-----   Message from Octavio Manns sent at 08/10/2016  3:54 PM EST ----- [phone] ----- Message ----- From: Aviva Signs, CMA Sent: 08/10/2016   3:11 PM To: Octavio Manns  What exactly do I need to order?   Diagnostic bilateral or .... ----- Message ----- From: Octavio Manns Sent: 08/10/2016   8:27 AM To: Aviva Signs, CMA  Can you please put these orders in? Thank you ----- Message ----- From: Cassandria Anger, MD Sent: 08/09/2016  10:34 PM To: Octavio Manns  Ok diagnostic mammo to order Thx ----- Message ----- From: Octavio Manns Sent: 08/09/2016   9:49 AM To: Cassandria Anger, MD  Sorry about that. Since she had a right breast mass back in 2015 they are needing a diagnostic mammo and right [place] instead of regular mammo. IMG [id number] and IMG [id number] thanks ----- Message ----- From: Cassandria Anger, MD Sent: 08/08/2016  11:40 PM To: Scharlene Corn, I do not understand: what do I need o do? Thx ----- Message ----- From: Octavio Manns Sent: 07/27/2016  12:13 PM To: Cassandria Anger, MD  Per [facility]: "pt needs bil diagnostic mammo and rt u/s per report from 03/2014 (rt breast mass)" Please advise

## 2016-08-18 ENCOUNTER — Other Ambulatory Visit: Payer: Self-pay | Admitting: Internal Medicine

## 2016-08-18 DIAGNOSIS — N6310 Unspecified lump in the right breast, unspecified quadrant: Secondary | ICD-10-CM

## 2016-08-24 ENCOUNTER — Other Ambulatory Visit: Payer: BLUE CROSS/BLUE SHIELD

## 2016-09-04 ENCOUNTER — Other Ambulatory Visit: Payer: Self-pay | Admitting: Internal Medicine

## 2016-09-04 ENCOUNTER — Ambulatory Visit
Admission: RE | Admit: 2016-09-04 | Discharge: 2016-09-04 | Disposition: A | Discharge status: Home / Self Care | Payer: BLUE CROSS/BLUE SHIELD | Source: Ambulatory Visit | Attending: Internal Medicine | Admitting: Internal Medicine

## 2016-09-04 ENCOUNTER — Ambulatory Visit: Payer: BLUE CROSS/BLUE SHIELD

## 2016-09-04 ENCOUNTER — Other Ambulatory Visit: Payer: Self-pay

## 2016-09-04 DIAGNOSIS — R928 Other abnormal and inconclusive findings on diagnostic imaging of breast: Secondary | ICD-10-CM

## 2016-09-04 DIAGNOSIS — N6310 Unspecified lump in the right breast, unspecified quadrant: Secondary | ICD-10-CM

## 2016-09-04 DIAGNOSIS — N6320 Unspecified lump in the left breast, unspecified quadrant: Secondary | ICD-10-CM

## 2016-12-30 ENCOUNTER — Other Ambulatory Visit: Payer: Self-pay | Admitting: Internal Medicine

## 2017-01-16 ENCOUNTER — Other Ambulatory Visit: Payer: Self-pay

## 2017-01-16 ENCOUNTER — Other Ambulatory Visit: Payer: Self-pay | Admitting: Internal Medicine

## 2017-01-16 DIAGNOSIS — N63 Unspecified lump in unspecified breast: Secondary | ICD-10-CM

## 2017-03-12 ENCOUNTER — Other Ambulatory Visit: Payer: BLUE CROSS/BLUE SHIELD

## 2017-03-26 ENCOUNTER — Ambulatory Visit
Admission: RE | Admit: 2017-03-26 | Discharge: 2017-03-26 | Disposition: A | Discharge status: Home / Self Care | Payer: BLUE CROSS/BLUE SHIELD | Source: Ambulatory Visit | Attending: Internal Medicine | Admitting: Internal Medicine

## 2017-03-26 ENCOUNTER — Ambulatory Visit: Payer: BLUE CROSS/BLUE SHIELD

## 2017-03-26 DIAGNOSIS — N63 Unspecified lump in unspecified breast: Secondary | ICD-10-CM

## 2017-03-31 ENCOUNTER — Other Ambulatory Visit: Payer: Self-pay | Admitting: Internal Medicine

## 2017-04-28 ENCOUNTER — Other Ambulatory Visit: Payer: Self-pay | Admitting: Internal Medicine

## 2017-07-04 ENCOUNTER — Other Ambulatory Visit: Payer: Self-pay

## 2017-07-04 MED ORDER — VENLAFAXINE HCL ER 150 MG PO CP24: ORAL_CAPSULE | ORAL | 0 refills | Status: DC

## 2017-09-14 ENCOUNTER — Other Ambulatory Visit: Payer: Self-pay | Admitting: Internal Medicine

## 2017-10-09 ENCOUNTER — Ambulatory Visit (INDEPENDENT_AMBULATORY_CARE_PROVIDER_SITE_OTHER): Payer: Self-pay | Admitting: Family Medicine

## 2017-10-09 ENCOUNTER — Encounter: Payer: Self-pay | Admitting: Family Medicine

## 2017-10-09 VITALS — BP 138/80 | HR 79 | Temp 98.2°F | Wt 168.5 lb

## 2017-10-09 DIAGNOSIS — R448 Other symptoms and signs involving general sensations and perceptions: Secondary | ICD-10-CM

## 2017-10-09 DIAGNOSIS — R21 Rash and other nonspecific skin eruption: Secondary | ICD-10-CM

## 2017-10-09 DIAGNOSIS — R6889 Other general symptoms and signs: Secondary | ICD-10-CM

## 2017-10-09 MED ORDER — TRIAMCINOLONE ACETONIDE 0.1 % EX CREA: 1.0000 "application " | TOPICAL_CREAM | Freq: Two times a day (BID) | CUTANEOUS | 0 refills | Status: DC

## 2017-10-09 NOTE — Progress Notes (Signed)
  Subjective:     Patient ID: Tamara Charles, female   DOB: 12-14-1960, 57 y.o.   MRN: [id number]  HPI Patient seen as a work in with the following concerns  She's had some scattered rash on her right wrist as well as lower legs. This is scaly and slightly pruritic. She tried over-the-counter hydrocortisone without much improvement. Present for several weeks/months. She recalled a cat scratch to her right wrist recently but some of her leg lesions preceded that. She has pending follow-up with dermatology -already scheduled. Denies any involvement of the trunk.  Patient also states that she's had somewhat of a "pulling" sensation around her right periorbital region. She states she has had sinus infections in the past and she does not have those types of symptoms. She has not had any bloody nasal discharge. No purulent secretions. No headache. No visual difficulties. No nystagmus. No extraocular movement difficulties. Denies any real facial pain. She states she frequently wakes up in the morning and noticed some puffiness around her right lower lid region. No eye discharge.  Past Medical History:  Diagnosis Date  . Hypertension   . Spontaneous pneumothorax    at 57 yo   Past Surgical History:  Procedure Laterality Date  . ABDOMINAL HYSTERECTOMY     partial  . BREAST SURGERY Bilateral    breast reduction  . LUNG SURGERY Bilateral 1997  . REDUCTION MAMMAPLASTY      reports that she has been smoking cigarettes.  She has been smoking about 0.50 packs per day. She has never used smokeless tobacco. She reports that she drinks alcohol. She reports that she does not use drugs. family history includes Breast cancer in her maternal aunt; Heart disease in her brother and mother; Hypertension in her mother. No Known Allergies   Review of Systems  Constitutional: Negative for chills and fever.  HENT: Negative for hearing loss and sinus pain.   Eyes: Negative for visual disturbance.  Respiratory:  Negative for shortness of breath.   Skin: Positive for rash.  Neurological: Negative for headaches.       Objective:   Physical Exam  Constitutional: She appears well-developed and well-nourished.  HENT:  Right Ear: External ear normal.  Left Ear: External ear normal.  Eyes: Pupils are equal, round, and reactive to light. EOM are normal.  Neck: No thyromegaly present.  Cardiovascular: Normal rate and regular rhythm.  Pulmonary/Chest: Effort normal and breath sounds normal. She has no wheezes. She has no rales.  Lymphadenopathy:    She has no cervical adenopathy.  Skin: Rash noted.  Patient has macular to slightly raised somewhat violaceous colored skin rash which is slightly scaly with some patches scattered on her lower extremities as well as right wrist. No vesicles. No pustules. Nontender.       Assessment:     #1 scaly somewhat violaceous appearing skin rash lower extremities and right wrist-question lichen planus  #2 patient describes pressure-like sensation around her right eye. She is not have any typical sinusitis symptoms. Nonfocal exam. Denies headache. Nonfocal exam neurologically.  Etiology unclear.    Plan:     -Patient has dermatology appointment pending already. She was asking for topical to try on her rash and we agreed to triamcinolone 0.1% cream twice daily. May need biopsy for further clarification -Follow-up immediately for any progressive headache, visual difficulties, weakness  Eulas Post MD [facility]

## 2017-10-09 NOTE — Patient Instructions (Signed)
Follow up for any visual changes- blurred vision or any double vision  Also watch out for any progressive headache, focal weakness, confusion or other neurologic concerns.

## 2017-10-12 ENCOUNTER — Other Ambulatory Visit: Payer: Self-pay

## 2017-10-12 ENCOUNTER — Emergency Department (HOSPITAL_BASED_OUTPATIENT_CLINIC_OR_DEPARTMENT_OTHER): Payer: Self-pay

## 2017-10-12 ENCOUNTER — Emergency Department (HOSPITAL_BASED_OUTPATIENT_CLINIC_OR_DEPARTMENT_OTHER)
Admission: EM | Admit: 2017-10-12 | Discharge: 2017-10-12 | Disposition: A | Discharge status: Home / Self Care | Payer: Self-pay | Attending: Physician Assistant | Admitting: Physician Assistant

## 2017-10-12 ENCOUNTER — Encounter (HOSPITAL_BASED_OUTPATIENT_CLINIC_OR_DEPARTMENT_OTHER): Payer: Self-pay | Admitting: Emergency Medicine

## 2017-10-12 DIAGNOSIS — I10 Essential (primary) hypertension: Secondary | ICD-10-CM | POA: Insufficient documentation

## 2017-10-12 DIAGNOSIS — R0789 Other chest pain: Secondary | ICD-10-CM | POA: Insufficient documentation

## 2017-10-12 DIAGNOSIS — R079 Chest pain, unspecified: Secondary | ICD-10-CM

## 2017-10-12 DIAGNOSIS — Z79899 Other long term (current) drug therapy: Secondary | ICD-10-CM | POA: Insufficient documentation

## 2017-10-12 DIAGNOSIS — Z7982 Long term (current) use of aspirin: Secondary | ICD-10-CM | POA: Insufficient documentation

## 2017-10-12 DIAGNOSIS — F419 Anxiety disorder, unspecified: Secondary | ICD-10-CM | POA: Insufficient documentation

## 2017-10-12 DIAGNOSIS — F17210 Nicotine dependence, cigarettes, uncomplicated: Secondary | ICD-10-CM | POA: Insufficient documentation

## 2017-10-12 LAB — CBC
HEMATOCRIT: 41.7 % (ref 36.0–46.0)
HEMOGLOBIN: 15 g/dL (ref 12.0–15.0)
MCH: 34.2 pg — AB (ref 26.0–34.0)
MCHC: 36 g/dL (ref 30.0–36.0)
MCV: 95 fL (ref 78.0–100.0)
PLATELETS: 295 10*3/uL (ref 150–400)
RBC: 4.39 MIL/uL (ref 3.87–5.11)
RDW: 12.3 % (ref 11.5–15.5)
WBC: 5.8 10*3/uL (ref 4.0–10.5)

## 2017-10-12 LAB — BASIC METABOLIC PANEL
ANION GAP: 10 (ref 5–15)
BUN: 15 mg/dL (ref 6–20)
CHLORIDE: 101 mmol/L (ref 101–111)
CO2: 25 mmol/L (ref 22–32)
CREATININE: 0.65 mg/dL (ref 0.44–1.00)
Calcium: 9.7 mg/dL (ref 8.9–10.3)
GFR calc Af Amer: >60 mL/min (ref >60)
GFR calc non Af Amer: >60 mL/min (ref >60)
Glucose, Bld: 109 mg/dL — ABNORMAL HIGH (ref 65–99)
POTASSIUM: 3.6 mmol/L (ref 3.5–5.1)
SODIUM: 136 mmol/L (ref 135–145)

## 2017-10-12 LAB — TROPONIN I
Troponin I: <0.03 ng/mL (ref <0.03)
Troponin I: <0.03 ng/mL (ref <0.03)

## 2017-10-12 NOTE — ED Notes (Signed)
ED Provider at bedside. 

## 2017-10-12 NOTE — ED Notes (Signed)
Patient transported to X-ray 

## 2017-10-12 NOTE — ED Notes (Signed)
Upon pts husband leaving patient admits she is having anxiety related to possibly leaving her husband.  Requests that staff keeps this confidential as husband has a temper and she does not want to argue with him.  Reports she is safe at home but that she wants to leave because she is tired of fighting with him constantly.  States she believes she is having a panic attack. MD made aware.

## 2017-10-12 NOTE — Discharge Instructions (Addendum)
We are unsure what caused her chest pain today.  We are glad you are feeling better.  It is very possible that is your heart and you may need to follow-up with cardiologist.  We want you to return promptly to your primary care physician and get further work-up.  If you have any increasing chest pain, shortness of breath, or other concerns please report return immediately to the emergency department.  As we recommend all people smoke we recommend that you quit smoking.

## 2017-10-12 NOTE — ED Triage Notes (Signed)
Patient reports chest pain with nausea and dizziness which has been going on for a week.  Reports worsening today.  Reports pain to left chest.

## 2017-10-12 NOTE — ED Provider Notes (Signed)
[facility] EMERGENCY DEPARTMENT Provider Note   CSN: [id number] Arrival date & time: 10/12/17  1044     History   Chief Complaint Chief Complaint  Patient presents with  . Chest Pain    HPI Tamara Charles is a 57 y.o. female.  HPI   Patient is a 57 year old female presenting with chest pain earlier today.  She reports that for last week she had occasional twinges of chest pain in her left chest wall.  Patient has history of hypertension, and is a current smoker.  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  Patient does denies any increased stress.  Pain radiates a little bit to the left shoulder.  Not to the jaw.  Not associated with shortness of breath or exertion.  Patient has noted no long travel, calf swelling, or shortness of breath.  Past Medical History:  Diagnosis Date  . Hypertension   . Spontaneous pneumothorax    at 57 yo    Patient Active Problem List   Diagnosis Date Noted  . Hot flash not due to menopause 06/26/2016  . Herpes simplex 04/20/2015  . Weight gain 08/25/2014  . Well adult exam 02/23/2014  . Bleb, lung ([place]) 02/23/2014  . Family history of early CAD 02/23/2014  . Tobacco use disorder 02/23/2014  . Hypertension 04/21/2013  . Spontaneous pneumothorax 04/21/2013    Past Surgical History:  Procedure Laterality Date  . ABDOMINAL HYSTERECTOMY     partial  . BREAST SURGERY Bilateral    breast reduction  . LUNG SURGERY Bilateral 1997  . REDUCTION MAMMAPLASTY       OB History   None      Home Medications    Prior to Admission medications   Medication Sig Start Date End Date Taking? Authorizing Provider  aspirin EC 81 MG tablet Take 1 tablet (81 mg total) by mouth daily. 02/23/14   Plotnikov, Evie Lacks, MD  naproxen sodium (ANAPROX) 220 MG tablet Take 220 mg by mouth 2 (two) times daily with a  meal.    [provider]  nortriptyline (PAMELOR) 10 MG capsule Take 1 capsule (10 mg total) by mouth at bedtime. 06/26/16   Plotnikov, Evie Lacks, MD  triamcinolone cream (KENALOG) 0.1 % Apply 1 application topically 2 (two) times daily. 10/09/17   Burchette, Alinda Sierras, MD  triamterene-hydrochlorothiazide (MAXZIDE-25) 37.5-25 MG tablet TAKE 1 TABLET BY MOUTH EVERY DAY 04/29/17   Plotnikov, Evie Lacks, MD  valACYclovir (VALTREX) 500 MG tablet Take 1 tablet (500 mg total) by mouth 2 (two) times daily. 06/26/16   Plotnikov, Evie Lacks, MD  venlafaxine XR (EFFEXOR-XR) 150 MG 24 hr capsule TAKE 1 CAPSULE BY MOUTH DAILY WITH BREAKFAST. **NEED OV FOR FURTHER REFILLS** Patient not taking: Reported on 10/09/2017 07/04/17   Plotnikov, Evie Lacks, MD    Family History Family History  Problem Relation Age of Onset  . Heart disease Mother   . Hypertension Mother   . Heart disease Brother   . Breast cancer Maternal Aunt     Social History Social History   Tobacco Use  . Smoking status: Current Every Day Smoker    Packs/day: 0.50    Types: Cigarettes  . Smokeless tobacco: Never Used  Substance Use Topics  . Alcohol use: Yes    Comment: occ  . Drug use: Yes  Frequency: 7.0 times per week    Types: Marijuana     Allergies   Patient has no known allergies.   Review of Systems Review of Systems  Constitutional: Negative for activity change, fatigue and fever.  Respiratory: Positive for chest tightness. Negative for shortness of breath.   Cardiovascular: Positive for chest pain.  Gastrointestinal: Negative for abdominal pain.  All other systems reviewed and are negative.    Physical Exam Updated Vital Signs BP (!) 157/73   Pulse 60   Temp 98 F (36.7 C)   Resp 15   LMP 06/05/2008   SpO2 100%   Physical Exam  Constitutional: She is oriented to person, place, and time. She appears well-developed and well-nourished.  HENT:  Head: Normocephalic and atraumatic.  Eyes: Right eye  exhibits no discharge.  Cardiovascular: Normal rate, regular rhythm, normal heart sounds and normal pulses.  No murmur heard. Pulmonary/Chest: Effort normal and breath sounds normal. She has no wheezes. She has no rales.  Abdominal: Soft. She exhibits no distension. There is no tenderness.  Neurological: She is oriented to person, place, and time.  Skin: Skin is warm and dry. She is not diaphoretic.  Psychiatric: She has a normal mood and affect.  Nursing note and vitals reviewed.    ED Treatments / Results  Labs (all labs ordered are listed, but only abnormal results are displayed) Labs Reviewed  BASIC METABOLIC PANEL - Abnormal; Notable for the following components:      Result Value   Glucose, Bld 109 (*)    All other components within normal limits  CBC - Abnormal; Notable for the following components:   MCH 34.2 (*)    All other components within normal limits  TROPONIN I    EKG EKG Interpretation  Date/Time:  Friday Oct 12 2017 10:53:57 EDT Ventricular Rate:  66 PR Interval:    QRS Duration: 106 QT Interval:  447 QTC Calculation: 469 R Axis:   82 Text Interpretation:  Sinus rhythm Low voltage, precordial leads Borderline repolarization abnormality Normal sinus rhythm Confirmed by Thomasene Lot, Bishop ([id number]) on 10/12/2017 11:01:24 AM   Radiology No results found.  Procedures Procedures (including critical care time)  Medications Ordered in ED Medications - No data to display   Initial Impression / Assessment and Plan / ED Course  I have reviewed the triage vital signs and the nursing notes.  Pertinent labs & imaging results that were available during my care of the patient were reviewed by me and considered in my medical decision making (see chart for details).     Patient is a 57 year old female presenting with chest pain earlier today.  She reports that for last week she had occasional twinges of chest pain in her left chest wall.  Patient has history of  hypertension, and is a current smoker.  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  Patient does denies any increased stress.  Pain radiates a little bit to the left shoulder.  Not to the jaw.  Not associated with shortness of breath or exertion.  Patient has noted no long travel, calf swelling, or shortness of breath.  11:30 AM Heart score of 3.  Hypertension smoking and age.  Will get delta troponin.  Delta troponin is negative.  Patient does not have any risk factors PE  Will discharge home.  She  has normal vital signs and appears well.   Final Clinical Impressions(s) / ED Diagnoses   Final diagnoses:  None    ED Discharge Orders    None       Seon Gaertner, Fredia Sorrow, MD 10/12/17 1517

## 2017-10-23 ENCOUNTER — Ambulatory Visit (INDEPENDENT_AMBULATORY_CARE_PROVIDER_SITE_OTHER): Payer: Self-pay | Admitting: Internal Medicine

## 2017-10-23 ENCOUNTER — Encounter: Payer: Self-pay | Admitting: Internal Medicine

## 2017-10-23 DIAGNOSIS — R21 Rash and other nonspecific skin eruption: Secondary | ICD-10-CM

## 2017-10-23 DIAGNOSIS — Z0000 Encounter for general adult medical examination without abnormal findings: Secondary | ICD-10-CM

## 2017-10-23 MED ORDER — AZITHROMYCIN 250 MG PO TABS
ORAL_TABLET | ORAL | 0 refills | Status: DC
Start: 2017-10-23 — End: 2018-01-28

## 2017-10-23 MED ORDER — FLUCONAZOLE 150 MG PO TABS: 150.0000 mg | ORAL_TABLET | Freq: Once | ORAL | 1 refills | Status: AC

## 2017-10-23 NOTE — Patient Instructions (Signed)
Health Maintenance for Postmenopausal Women Menopause is a normal process in which your reproductive ability comes to an end. This process happens gradually over a span of months to years, usually between the ages of 22 and 9. Menopause is complete when you have missed 12 consecutive menstrual periods. It is important to talk with your health care provider about some of the most common conditions that affect postmenopausal women, such as heart disease, cancer, and bone loss (osteoporosis). Adopting a healthy lifestyle and getting preventive care can help to promote your health and wellness. Those actions can also lower your chances of developing some of these common conditions. What should I know about menopause? During menopause, you may experience a number of symptoms, such as:  Moderate-to-severe hot flashes.  Night sweats.  Decrease in sex drive.  Mood swings.  Headaches.  Tiredness.  Irritability.  Memory problems.  Insomnia.  Choosing to treat or not to treat menopausal changes is an individual decision that you make with your health care provider. What should I know about hormone replacement therapy and supplements? Hormone therapy products are effective for treating symptoms that are associated with menopause, such as hot flashes and night sweats. Hormone replacement carries certain risks, especially as you become older. If you are thinking about using estrogen or estrogen with progestin treatments, discuss the benefits and risks with your health care provider. What should I know about heart disease and stroke? Heart disease, heart attack, and stroke become more likely as you age. This may be due, in part, to the hormonal changes that your body experiences during menopause. These can affect how your body processes dietary fats, triglycerides, and cholesterol. Heart attack and stroke are both medical emergencies. There are many things that you can do to help prevent heart disease  and stroke:  Have your blood pressure checked at least every 1-2 years. High blood pressure causes heart disease and increases the risk of stroke.  If you are 53-22 years old, ask your health care provider if you should take aspirin to prevent a heart attack or a stroke.  Do not use any tobacco products, including cigarettes, chewing tobacco, or electronic cigarettes. If you need help quitting, ask your health care provider.  It is important to eat a healthy diet and maintain a healthy weight. ? Be sure to include plenty of vegetables, fruits, low-fat dairy products, and lean protein. ? Avoid eating foods that are high in solid fats, added sugars, or salt (sodium).  Get regular exercise. This is one of the most important things that you can do for your health. ? Try to exercise for at least 150 minutes each week. The type of exercise that you do should increase your heart rate and make you sweat. This is known as moderate-intensity exercise. ? Try to do strengthening exercises at least twice each week. Do these in addition to the moderate-intensity exercise.  Know your numbers.Ask your health care provider to check your cholesterol and your blood glucose. Continue to have your blood tested as directed by your health care provider.  What should I know about cancer screening? There are several types of cancer. Take the following steps to reduce your risk and to catch any cancer development as early as possible. Breast Cancer  Practice breast self-awareness. ? This means understanding how your breasts normally appear and feel. ? It also means doing regular breast self-exams. Let your health care provider know about any changes, no matter how small.  If you are 40  or older, have a clinician do a breast exam (clinical breast exam or CBE) every year. Depending on your age, family history, and medical history, it may be recommended that you also have a yearly breast X-ray (mammogram).  If you  have a family history of breast cancer, talk with your health care provider about genetic screening.  If you are at high risk for breast cancer, talk with your health care provider about having an MRI and a mammogram every year.  Breast cancer (BRCA) gene test is recommended for women who have family members with BRCA-related cancers. Results of the assessment will determine the need for genetic counseling and BRCA1 and for BRCA2 testing. BRCA-related cancers include these types: ? Breast. This occurs in males or females. ? Ovarian. ? Tubal. This may also be called fallopian tube cancer. ? Cancer of the abdominal or pelvic lining (peritoneal cancer). ? Prostate. ? Pancreatic.  Cervical, Uterine, and Ovarian Cancer Your health care provider may recommend that you be screened regularly for cancer of the pelvic organs. These include your ovaries, uterus, and vagina. This screening involves a pelvic exam, which includes checking for microscopic changes to the surface of your cervix (Pap test).  For women ages 21-65, health care providers may recommend a pelvic exam and a Pap test every three years. For women ages 79-65, they may recommend the Pap test and pelvic exam, combined with testing for human papilloma virus (HPV), every five years. Some types of HPV increase your risk of cervical cancer. Testing for HPV may also be done on women of any age who have unclear Pap test results.  Other health care providers may not recommend any screening for nonpregnant women who are considered low risk for pelvic cancer and have no symptoms. Ask your health care provider if a screening pelvic exam is right for you.  If you have had past treatment for cervical cancer or a condition that could lead to cancer, you need Pap tests and screening for cancer for at least 20 years after your treatment. If Pap tests have been discontinued for you, your risk factors (such as having a new sexual partner) need to be  reassessed to determine if you should start having screenings again. Some women have medical problems that increase the chance of getting cervical cancer. In these cases, your health care provider may recommend that you have screening and Pap tests more often.  If you have a family history of uterine cancer or ovarian cancer, talk with your health care provider about genetic screening.  If you have vaginal bleeding after reaching menopause, tell your health care provider.  There are currently no reliable tests available to screen for ovarian cancer.  Lung Cancer Lung cancer screening is recommended for adults 69-62 years old who are at high risk for lung cancer because of a history of smoking. A yearly low-dose CT scan of the lungs is recommended if you:  Currently smoke.  Have a history of at least 30 pack-years of smoking and you currently smoke or have quit within the past 15 years. A pack-year is smoking an average of one pack of cigarettes per day for one year.  Yearly screening should:  Continue until it has been 15 years since you quit.  Stop if you develop a health problem that would prevent you from having lung cancer treatment.  Colorectal Cancer  This type of cancer can be detected and can often be prevented.  Routine colorectal cancer screening usually begins at  age 11 and continues through age 64.  If you have risk factors for colon cancer, your health care provider may recommend that you be screened at an earlier age.  If you have a family history of colorectal cancer, talk with your health care provider about genetic screening.  Your health care provider may also recommend using home test kits to check for hidden blood in your stool.  A small camera at the end of a tube can be used to examine your colon directly (sigmoidoscopy or colonoscopy). This is done to check for the earliest forms of colorectal cancer.  Direct examination of the colon should be repeated every  5-10 years until age 68. However, if early forms of precancerous polyps or small growths are found or if you have a family history or genetic risk for colorectal cancer, you may need to be screened more often.  Skin Cancer  Check your skin from head to toe regularly.  Monitor any moles. Be sure to tell your health care provider: ? About any new moles or changes in moles, especially if there is a change in a mole's shape or color. ? If you have a mole that is larger than the size of a pencil eraser.  If any of your family members has a history of skin cancer, especially at a young age, talk with your health care provider about genetic screening.  Always use sunscreen. Apply sunscreen liberally and repeatedly throughout the day.  Whenever you are outside, protect yourself by wearing long sleeves, pants, a wide-brimmed hat, and sunglasses.  What should I know about osteoporosis? Osteoporosis is a condition in which bone destruction happens more quickly than new bone creation. After menopause, you may be at an increased risk for osteoporosis. To help prevent osteoporosis or the bone fractures that can happen because of osteoporosis, the following is recommended:  If you are 43-31 years old, get at least 1,000 mg of calcium and at least 600 mg of vitamin D per day.  If you are older than age 6 but younger than age 74, get at least 1,200 mg of calcium and at least 600 mg of vitamin D per day.  If you are older than age 74, get at least 1,200 mg of calcium and at least 800 mg of vitamin D per day.  Smoking and excessive alcohol intake increase the risk of osteoporosis. Eat foods that are rich in calcium and vitamin D, and do weight-bearing exercises several times each week as directed by your health care provider. What should I know about how menopause affects my mental health? Depression may occur at any age, but it is more common as you become older. Common symptoms of depression  include:  Low or sad mood.  Changes in sleep patterns.  Changes in appetite or eating patterns.  Feeling an overall lack of motivation or enjoyment of activities that you previously enjoyed.  Frequent crying spells.  Talk with your health care provider if you think that you are experiencing depression. What should I know about immunizations? It is important that you get and maintain your immunizations. These include:  Tetanus, diphtheria, and pertussis (Tdap) booster vaccine.  Influenza every year before the flu season begins.  Pneumonia vaccine.  Shingles vaccine.  Your health care provider may also recommend other immunizations. This information is not intended to replace advice given to you by your health care provider. Make sure you discuss any questions you have with your health care provider. Document Released: 07/14/2005  Document Revised: 12/10/2015 Document Reviewed: 02/23/2015 Elsevier Interactive Patient Education  [employer].

## 2017-10-23 NOTE — Assessment & Plan Note (Signed)
Pt wants some labs done

## 2017-10-23 NOTE — Progress Notes (Signed)
Subjective:  Patient ID: Tamara Charles, female    DOB: 11/02/1960  Age: 57 y.o. MRN: [id number]  CC: No chief complaint on file.   HPI Tamara Charles presents for cat scratches on the R wrist in Dec 2019 (a new kitten) followed by more rash on B wrists and B legs - more on L lowe leg  Outpatient Medications Prior to Visit  Medication Sig Dispense Refill  . aspirin EC 81 MG tablet Take 1 tablet (81 mg total) by mouth daily. 100 tablet 3  . naproxen sodium (ANAPROX) 220 MG tablet Take 220 mg by mouth 2 (two) times daily with a meal.    . nortriptyline (PAMELOR) 10 MG capsule Take 1 capsule (10 mg total) by mouth at bedtime. 90 capsule 2  . triamcinolone cream (KENALOG) 0.1 % Apply 1 application topically 2 (two) times daily. 30 g 0  . triamterene-hydrochlorothiazide (MAXZIDE-25) 37.5-25 MG tablet TAKE 1 TABLET BY MOUTH EVERY DAY 90 tablet 3  . valACYclovir (VALTREX) 500 MG tablet Take 1 tablet (500 mg total) by mouth 2 (two) times daily. 20 tablet 3  . venlafaxine XR (EFFEXOR-XR) 150 MG 24 hr capsule TAKE 1 CAPSULE BY MOUTH DAILY WITH BREAKFAST. **NEED OV FOR FURTHER REFILLS** 90 capsule 0   No facility-administered medications prior to visit.     ROS Review of Systems  Constitutional: Positive for fatigue. Negative for activity change, appetite change, chills and unexpected weight change.  HENT: Negative for congestion, mouth sores and sinus pressure.   Eyes: Negative for visual disturbance.  Respiratory: Negative for cough and chest tightness.   Gastrointestinal: Negative for abdominal pain and nausea.  Genitourinary: Negative for difficulty urinating, frequency and vaginal pain.  Musculoskeletal: Negative for back pain and gait problem.  Skin: Positive for color change and rash. Negative for pallor.  Neurological: Negative for dizziness, tremors, weakness, numbness and headaches.  Psychiatric/Behavioral: Negative for confusion and sleep disturbance. The patient is  nervous/anxious.     Objective:  BP 130/82 (BP Location: Left Arm, Patient Position: Sitting, Cuff Size: Normal)   Pulse 69   Temp 98.5 F (36.9 C) (Oral)   Ht 5\' 7"  (1.702 m)   Wt 170 lb (77.1 kg)   LMP 06/05/2008   SpO2 98%   BMI 26.63 kg/m   BP Readings from Last 3 Encounters:  10/23/17 130/82  10/12/17 133/86  10/09/17 138/80    Wt Readings from Last 3 Encounters:  10/23/17 170 lb (77.1 kg)  10/09/17 168 lb 8 oz (76.4 kg)  06/26/16 160 lb (72.6 kg)    Physical Exam  Constitutional: She appears well-developed. No distress.  HENT:  Head: Normocephalic.  Right Ear: External ear normal.  Left Ear: External ear normal.  Nose: Nose normal.  Mouth/Throat: Oropharynx is clear and moist.  Eyes: Pupils are equal, round, and reactive to light. Conjunctivae are normal. Right eye exhibits no discharge. Left eye exhibits no discharge.  Neck: Normal range of motion. Neck supple. No JVD present. No tracheal deviation present. No thyromegaly present.  Cardiovascular: Normal rate, regular rhythm and normal heart sounds.  Pulmonary/Chest: No stridor. No respiratory distress. She has no wheezes.  Abdominal: Soft. Bowel sounds are normal. She exhibits no distension and no mass. There is no tenderness. There is no rebound and no guarding.  Musculoskeletal: She exhibits no edema or tenderness.  Lymphadenopathy:    She has no cervical adenopathy.  Neurological: She displays normal reflexes. No cranial nerve deficit. She exhibits normal muscle tone. Coordination normal.  Skin: Rash noted. No erythema.  Psychiatric: Her behavior is normal. Judgment and thought content normal.   hyperpigmented and purple papular rash on wrists and B LEs Lab Results  Component Value Date   WBC 5.8 10/12/2017   HGB 15.0 10/12/2017   HCT 41.7 10/12/2017   PLT 295 10/12/2017   GLUCOSE 109 (H) 10/12/2017   CHOL 229 (H) 06/27/2016   TRIG 143.0 06/27/2016   HDL 57.30 06/27/2016   LDLDIRECT 146.0  08/25/2014   LDLCALC 144 (H) 06/27/2016   ALT 17 06/27/2016   AST 20 06/27/2016   NA 136 10/12/2017   K 3.6 10/12/2017   CL 101 10/12/2017   CREATININE 0.65 10/12/2017   BUN 15 10/12/2017   CO2 25 10/12/2017   TSH 0.50 06/27/2016    Dg Chest 2 View  Result Date: 10/12/2017 CLINICAL DATA:  57 year old female with a history of chest pain and dizziness EXAM: CHEST - 2 VIEW COMPARISON:  02/23/2014 FINDINGS: Cardiomediastinal silhouette unchanged in size and contour. No evidence of central vascular congestion. No pneumothorax. Vague opacity in the lower right lung, more pronounced than the comparison. Unchanged appearance of pleuroparenchymal thickening at the apices. No pleural effusion. No displaced fracture. IMPRESSION: Vague linear opacity in the lower right lung, more pronounced than the comparison. This may reflect infection or simply atelectasis/scarring. Electronically Signed   By: Corrie Mckusick D.O.   On: 10/12/2017 11:38    Assessment & Plan:   There are no diagnoses linked to this encounter. I am having Tamara Charles maintain her naproxen sodium, aspirin EC, valACYclovir, nortriptyline, triamterene-hydrochlorothiazide, venlafaxine XR, and triamcinolone cream.  No orders of the defined types were placed in this encounter.    Follow-up: No follow-ups on file.  Walker Kehr, MD

## 2017-10-23 NOTE — Assessment & Plan Note (Signed)
?  cat scratch disease  Derm appt pending Zpac Triamc Diflucan po prn Pt declined tests

## 2018-01-20 ENCOUNTER — Emergency Department (HOSPITAL_BASED_OUTPATIENT_CLINIC_OR_DEPARTMENT_OTHER)
Admission: EM | Admit: 2018-01-20 | Discharge: 2018-01-20 | Disposition: A | Discharge status: Home / Self Care | Payer: Self-pay | Attending: Emergency Medicine | Admitting: Emergency Medicine

## 2018-01-20 ENCOUNTER — Emergency Department (HOSPITAL_BASED_OUTPATIENT_CLINIC_OR_DEPARTMENT_OTHER): Payer: Self-pay

## 2018-01-20 ENCOUNTER — Encounter (HOSPITAL_BASED_OUTPATIENT_CLINIC_OR_DEPARTMENT_OTHER): Payer: Self-pay | Admitting: Emergency Medicine

## 2018-01-20 ENCOUNTER — Other Ambulatory Visit: Payer: Self-pay

## 2018-01-20 DIAGNOSIS — F17210 Nicotine dependence, cigarettes, uncomplicated: Secondary | ICD-10-CM | POA: Insufficient documentation

## 2018-01-20 DIAGNOSIS — Z7982 Long term (current) use of aspirin: Secondary | ICD-10-CM | POA: Insufficient documentation

## 2018-01-20 DIAGNOSIS — K5792 Diverticulitis of intestine, part unspecified, without perforation or abscess without bleeding: Secondary | ICD-10-CM | POA: Insufficient documentation

## 2018-01-20 DIAGNOSIS — I10 Essential (primary) hypertension: Secondary | ICD-10-CM | POA: Insufficient documentation

## 2018-01-20 DIAGNOSIS — Z79899 Other long term (current) drug therapy: Secondary | ICD-10-CM | POA: Insufficient documentation

## 2018-01-20 LAB — COMPREHENSIVE METABOLIC PANEL
ALK PHOS: 52 U/L (ref 38–126)
ALT: 16 U/L (ref 0–44)
AST: 20 U/L (ref 15–41)
Albumin: 4.2 g/dL (ref 3.5–5.0)
Anion gap: 8 (ref 5–15)
BUN: 14 mg/dL (ref 6–20)
CO2: 27 mmol/L (ref 22–32)
CREATININE: 0.63 mg/dL (ref 0.44–1.00)
Calcium: 9.4 mg/dL (ref 8.9–10.3)
Chloride: 104 mmol/L (ref 98–111)
GFR calc non Af Amer: >60 mL/min (ref >60)
Glucose, Bld: 109 mg/dL — ABNORMAL HIGH (ref 70–99)
Potassium: 3.5 mmol/L (ref 3.5–5.1)
Sodium: 139 mmol/L (ref 135–145)
Total Bilirubin: 0.7 mg/dL (ref 0.3–1.2)
Total Protein: 7 g/dL (ref 6.5–8.1)

## 2018-01-20 LAB — URINALYSIS, ROUTINE W REFLEX MICROSCOPIC
Bilirubin Urine: NEGATIVE
Glucose, UA: NEGATIVE mg/dL
Hgb urine dipstick: NEGATIVE
Ketones, ur: NEGATIVE mg/dL
LEUKOCYTES UA: NEGATIVE
Nitrite: NEGATIVE
PROTEIN: NEGATIVE mg/dL
SPECIFIC GRAVITY, URINE: 1.01 (ref 1.005–1.030)
pH: 8 (ref 5.0–8.0)

## 2018-01-20 LAB — CBC WITH DIFFERENTIAL/PLATELET
Basophils Absolute: 0 10*3/uL (ref 0.0–0.1)
Basophils Relative: 0 %
Eosinophils Absolute: 0.1 10*3/uL (ref 0.0–0.7)
Eosinophils Relative: 2 %
HCT: 41.7 % (ref 36.0–46.0)
Hemoglobin: 14.5 g/dL (ref 12.0–15.0)
LYMPHS ABS: 1.6 10*3/uL (ref 0.7–4.0)
LYMPHS PCT: 19 %
MCH: 33.6 pg (ref 26.0–34.0)
MCHC: 34.8 g/dL (ref 30.0–36.0)
MCV: 96.5 fL (ref 78.0–100.0)
Monocytes Absolute: 0.6 10*3/uL (ref 0.1–1.0)
Monocytes Relative: 7 %
NEUTROS ABS: 6.2 10*3/uL (ref 1.7–7.7)
Neutrophils Relative %: 72 %
Platelets: 285 10*3/uL (ref 150–400)
RBC: 4.32 MIL/uL (ref 3.87–5.11)
RDW: 12.5 % (ref 11.5–15.5)
WBC: 8.5 10*3/uL (ref 4.0–10.5)

## 2018-01-20 LAB — LIPASE, BLOOD: Lipase: 42 U/L (ref 11–51)

## 2018-01-20 MED ORDER — MORPHINE SULFATE (PF) 2 MG/ML IV SOLN
2.0000 mg | Freq: Once | INTRAVENOUS | Status: DC
  Filled 2018-01-20: qty 1

## 2018-01-20 MED ORDER — HYDROCODONE-ACETAMINOPHEN 5-325 MG PO TABS: 1.0000 | ORAL_TABLET | Freq: Four times a day (QID) | ORAL | 0 refills | Status: DC | PRN

## 2018-01-20 MED ORDER — ONDANSETRON HCL 4 MG/2ML IJ SOLN
4.0000 mg | Freq: Once | INTRAMUSCULAR | Status: AC
  Administered 2018-01-20: 4 mg via INTRAVENOUS
  Filled 2018-01-20: qty 2

## 2018-01-20 MED ORDER — METRONIDAZOLE IN NACL 5-0.79 MG/ML-% IV SOLN
500.0000 mg | Freq: Once | INTRAVENOUS | Status: AC
  Administered 2018-01-20: 500 mg via INTRAVENOUS
  Filled 2018-01-20: qty 100

## 2018-01-20 MED ORDER — CIPROFLOXACIN IN D5W 400 MG/200ML IV SOLN
400.0000 mg | Freq: Once | INTRAVENOUS | Status: AC
  Administered 2018-01-20: 400 mg via INTRAVENOUS
  Filled 2018-01-20: qty 200

## 2018-01-20 MED ORDER — IOPAMIDOL (ISOVUE-300) INJECTION 61%
100.0000 mL | Freq: Once | INTRAVENOUS | Status: AC | PRN
  Administered 2018-01-20: 100 mL via INTRAVENOUS

## 2018-01-20 MED ORDER — METRONIDAZOLE 500 MG PO TABS: 500.0000 mg | ORAL_TABLET | Freq: Two times a day (BID) | ORAL | 0 refills | Status: DC

## 2018-01-20 MED ORDER — SODIUM CHLORIDE 0.9 % IV BOLUS
500.0000 mL | Freq: Once | INTRAVENOUS | Status: AC
  Administered 2018-01-20: 500 mL via INTRAVENOUS

## 2018-01-20 MED ORDER — ONDANSETRON HCL 4 MG PO TABS: 4.0000 mg | ORAL_TABLET | Freq: Four times a day (QID) | ORAL | 0 refills | Status: DC | PRN

## 2018-01-20 MED ORDER — CIPROFLOXACIN HCL 500 MG PO TABS: 500.0000 mg | ORAL_TABLET | Freq: Two times a day (BID) | ORAL | 0 refills | Status: DC

## 2018-01-20 MED ORDER — FLUCONAZOLE 150 MG PO TABS: 150.0000 mg | ORAL_TABLET | Freq: Once | ORAL | 0 refills | Status: AC

## 2018-01-20 NOTE — ED Provider Notes (Signed)
[facility] Provider Note   CSN: [id number] Arrival date & time: 01/20/18  1204     History   Chief Complaint Chief Complaint  Patient presents with  . Abdominal Pain    HPI Tamara Charles is a 57 y.o. female.  HPI Patient presents with gradual onset left-sided abdominal pain starting yesterday.  Gradually worsened throughout the night.  States she ate breakfast this morning it became nauseated.  Denies diarrhea or constipation.  No fever or chills.  Has had some urinary frequency and hesitancy without dysuria or hematuria.  No fever or chills.  The pain is worse with movement.  No new rashes. Past Medical History:  Diagnosis Date  . Hypertension   . Spontaneous pneumothorax    at 57 yo    Patient Active Problem List   Diagnosis Date Noted  . Rash and nonspecific skin eruption 10/23/2017  . Hot flash not due to menopause 06/26/2016  . Herpes simplex 04/20/2015  . Weight gain 08/25/2014  . Well adult exam 02/23/2014  . Bleb, lung ([place]) 02/23/2014  . Family history of early CAD 02/23/2014  . Tobacco use disorder 02/23/2014  . Hypertension 04/21/2013  . Spontaneous pneumothorax 04/21/2013    Past Surgical History:  Procedure Laterality Date  . ABDOMINAL HYSTERECTOMY     partial  . BREAST SURGERY Bilateral    breast reduction  . LUNG SURGERY Bilateral 1997  . REDUCTION MAMMAPLASTY       OB History   None      Home Medications    Prior to Admission medications   Medication Sig Start Date End Date Taking? Authorizing Provider  aspirin EC 81 MG tablet Take 1 tablet (81 mg total) by mouth daily. 02/23/14   Plotnikov, Evie Lacks, MD  azithromycin (ZITHROMAX Z-PAK) 250 MG tablet As directed 10/23/17   Plotnikov, Evie Lacks, MD  ciprofloxacin (CIPRO) 500 MG tablet Take 1 tablet (500 mg total) by mouth 2 (two) times daily. One po bid x 7 days 01/20/18   Julianne Rice, MD  HYDROcodone-acetaminophen [facility]) 5-325 MG tablet Take 1  tablet by mouth every 6 (six) hours as needed for severe pain. 01/20/18   Julianne Rice, MD  metroNIDAZOLE (FLAGYL) 500 MG tablet Take 1 tablet (500 mg total) by mouth 2 (two) times daily. One po bid x 7 days 01/20/18   Julianne Rice, MD  naproxen sodium (ANAPROX) 220 MG tablet Take 220 mg by mouth 2 (two) times daily with a meal.    [provider]  nortriptyline (PAMELOR) 10 MG capsule Take 1 capsule (10 mg total) by mouth at bedtime. 06/26/16   Plotnikov, Evie Lacks, MD  ondansetron (ZOFRAN) 4 MG tablet Take 1 tablet (4 mg total) by mouth every 6 (six) hours as needed for nausea or vomiting. 01/20/18   Julianne Rice, MD  triamcinolone cream (KENALOG) 0.1 % Apply 1 application topically 2 (two) times daily. 10/09/17   Burchette, Alinda Sierras, MD  triamterene-hydrochlorothiazide (MAXZIDE-25) 37.5-25 MG tablet TAKE 1 TABLET BY MOUTH EVERY DAY 04/29/17   Plotnikov, Evie Lacks, MD  valACYclovir (VALTREX) 500 MG tablet Take 1 tablet (500 mg total) by mouth 2 (two) times daily. 06/26/16   Plotnikov, Evie Lacks, MD  venlafaxine XR (EFFEXOR-XR) 150 MG 24 hr capsule TAKE 1 CAPSULE BY MOUTH DAILY WITH BREAKFAST. **NEED OV FOR FURTHER REFILLS** 07/04/17   Plotnikov, Evie Lacks, MD    Family History Family History  Problem Relation Age of Onset  . Heart disease Mother   .  Hypertension Mother   . Heart disease Brother   . Breast cancer Maternal Aunt     Social History Social History   Tobacco Use  . Smoking status: Current Every Day Smoker    Packs/day: 0.50    Types: Cigarettes  . Smokeless tobacco: Never Used  Substance Use Topics  . Alcohol use: Yes    Comment: occ  . Drug use: Yes    Frequency: 7.0 times per week    Types: Marijuana     Allergies   Patient has no known allergies.   Review of Systems Review of Systems  Constitutional: Negative for chills and fever.  Respiratory: Negative for cough and shortness of breath.   Cardiovascular: Negative for chest pain.    Gastrointestinal: Positive for abdominal pain and nausea. Negative for constipation, diarrhea and vomiting.  Genitourinary: Positive for difficulty urinating, frequency and urgency. Negative for dysuria and flank pain.  Musculoskeletal: Negative for back pain, myalgias, neck pain and neck stiffness.  Skin: Negative for rash and wound.  Neurological: Negative for dizziness, weakness, numbness and headaches.  All other systems reviewed and are negative.    Physical Exam Updated Vital Signs BP 116/68 (BP Location: Left Arm)   Pulse 60   Temp 98 F (36.7 C) (Oral)   Resp 18   Ht 5\' 4"  (1.626 m)   Wt 77.1 kg   LMP 06/05/2008   SpO2 98%   BMI 29.18 kg/m   Physical Exam  Constitutional: She is oriented to person, place, and time. She appears well-developed and well-nourished. No distress.  HENT:  Head: Normocephalic and atraumatic.  Mouth/Throat: Oropharynx is clear and moist.  Eyes: Pupils are equal, round, and reactive to light. EOM are normal.  Neck: Normal range of motion. Neck supple.  Cardiovascular: Normal rate and regular rhythm.  Pulmonary/Chest: Effort normal and breath sounds normal. No stridor. No respiratory distress. She has no wheezes. She has no rales. She exhibits no tenderness.  Abdominal: Soft. Bowel sounds are normal. There is tenderness. There is no rebound and no guarding.  Epigastric, left upper quadrant and left lower quadrant tenderness to palpation.  No rebound or guarding.  Musculoskeletal: Normal range of motion. She exhibits no edema or tenderness.  No CVA tenderness bilaterally.  Neurological: She is alert and oriented to person, place, and time.  Moves all extremities without focal deficit.  Sensation fully intact.  Skin: Skin is warm and dry. No rash noted. She is not diaphoretic. No erythema.  Psychiatric: She has a normal mood and affect. Her behavior is normal.  Nursing note and vitals reviewed.    ED Treatments / Results  Labs (all labs  ordered are listed, but only abnormal results are displayed) Labs Reviewed  URINALYSIS, ROUTINE W REFLEX MICROSCOPIC - Abnormal; Notable for the following components:      Result Value   APPearance CLOUDY (*)    All other components within normal limits  COMPREHENSIVE METABOLIC PANEL - Abnormal; Notable for the following components:   Glucose, Bld 109 (*)    All other components within normal limits  CBC WITH DIFFERENTIAL/PLATELET  LIPASE, BLOOD    EKG None  Radiology No results found.  Procedures Procedures (including critical care time)  Medications Ordered in ED Medications  sodium chloride 0.9 % bolus 500 mL (0 mLs Intravenous Stopped 01/20/18 1405)  ondansetron (ZOFRAN) injection 4 mg (4 mg Intravenous Given 01/20/18 1252)  iopamidol (ISOVUE-300) 61 % injection 100 mL (100 mLs Intravenous Contrast Given 01/20/18 1343)  ciprofloxacin (CIPRO) IVPB 400 mg (0 mg Intravenous Stopped 01/20/18 1703)  metroNIDAZOLE (FLAGYL) IVPB 500 mg (0 mg Intravenous Stopped 01/20/18 1541)  sodium chloride 0.9 % bolus 500 mL (0 mLs Intravenous Stopped 01/20/18 1443)     Initial Impression / Assessment and Plan / ED Course  I have reviewed the triage vital signs and the nursing notes.  Pertinent labs & imaging results that were available during my care of the patient were reviewed by me and considered in my medical decision making (see chart for details).    Patient is feeling better after medication.  CT with evidence of diverticulitis.  No perforation or abscess.  Will give IV antibiotics and discharge with oral antibiotic to follow-up with her primary physician.  Return precautions given.   Final Clinical Impressions(s) / ED Diagnoses   Final diagnoses:  Diverticulitis    ED Discharge Orders         Ordered    ciprofloxacin (CIPRO) 500 MG tablet  2 times daily     01/20/18 1451    metroNIDAZOLE (FLAGYL) 500 MG tablet  2 times daily     01/20/18 1451    HYDROcodone-acetaminophen  (NORCO) 5-325 MG tablet  Every 6 hours PRN     01/20/18 1451    ondansetron (ZOFRAN) 4 MG tablet  Every 6 hours PRN     01/20/18 1451    fluconazole (DIFLUCAN) 150 MG tablet   Once     01/20/18 1606           Julianne Rice, MD 01/22/18 [phone]

## 2018-01-20 NOTE — ED Notes (Signed)
Patient transported to CT 

## 2018-01-20 NOTE — ED Triage Notes (Signed)
L side abd pain since yesterday with nausea.

## 2018-01-20 NOTE — ED Notes (Signed)
Pt understood dc material. NAD noted. Script given at dc  

## 2018-01-28 ENCOUNTER — Other Ambulatory Visit (INDEPENDENT_AMBULATORY_CARE_PROVIDER_SITE_OTHER): Payer: Self-pay

## 2018-01-28 ENCOUNTER — Ambulatory Visit (INDEPENDENT_AMBULATORY_CARE_PROVIDER_SITE_OTHER): Payer: Self-pay | Admitting: Internal Medicine

## 2018-01-28 ENCOUNTER — Encounter: Payer: Self-pay | Admitting: Internal Medicine

## 2018-01-28 VITALS — BP 126/78 | HR 86 | Temp 97.6°F | Ht 64.0 in | Wt 167.0 lb

## 2018-01-28 DIAGNOSIS — Z0000 Encounter for general adult medical examination without abnormal findings: Secondary | ICD-10-CM

## 2018-01-28 DIAGNOSIS — K5792 Diverticulitis of intestine, part unspecified, without perforation or abscess without bleeding: Secondary | ICD-10-CM | POA: Insufficient documentation

## 2018-01-28 DIAGNOSIS — R197 Diarrhea, unspecified: Secondary | ICD-10-CM | POA: Insufficient documentation

## 2018-01-28 DIAGNOSIS — I10 Essential (primary) hypertension: Secondary | ICD-10-CM

## 2018-01-28 LAB — HEPATIC FUNCTION PANEL
ALT: 20 U/L (ref 0–35)
AST: 18 U/L (ref 0–37)
Albumin: 4.4 g/dL (ref 3.5–5.2)
Alkaline Phosphatase: 47 U/L (ref 39–117)
BILIRUBIN DIRECT: 0.1 mg/dL (ref 0.0–0.3)
BILIRUBIN TOTAL: 0.4 mg/dL (ref 0.2–1.2)
Total Protein: 7.1 g/dL (ref 6.0–8.3)

## 2018-01-28 LAB — LIPID PANEL
CHOL/HDL RATIO: 4
Cholesterol: 191 mg/dL (ref 0–200)
HDL: 43.7 mg/dL (ref >39.00)
LDL CALC: 125 mg/dL — AB (ref 0–99)
NonHDL: 147.03
Triglycerides: 111 mg/dL (ref 0.0–149.0)
VLDL: 22.2 mg/dL (ref 0.0–40.0)

## 2018-01-28 LAB — URINALYSIS
Bilirubin Urine: NEGATIVE
Hgb urine dipstick: NEGATIVE
Ketones, ur: NEGATIVE
Leukocytes, UA: NEGATIVE
Nitrite: NEGATIVE
PH: 7 (ref 5.0–8.0)
SPECIFIC GRAVITY, URINE: 1.01 (ref 1.000–1.030)
TOTAL PROTEIN, URINE-UPE24: NEGATIVE
URINE GLUCOSE: NEGATIVE
Urobilinogen, UA: 0.2 (ref 0.0–1.0)

## 2018-01-28 LAB — CBC WITH DIFFERENTIAL/PLATELET
BASOS ABS: 0.1 10*3/uL (ref 0.0–0.1)
Basophils Relative: 1.2 % (ref 0.0–3.0)
Eosinophils Absolute: 0.2 10*3/uL (ref 0.0–0.7)
Eosinophils Relative: 3.5 % (ref 0.0–5.0)
HCT: 42.4 % (ref 36.0–46.0)
Hemoglobin: 14.9 g/dL (ref 12.0–15.0)
LYMPHS ABS: 1.4 10*3/uL (ref 0.7–4.0)
Lymphocytes Relative: 29.5 % (ref 12.0–46.0)
MCHC: 35.2 g/dL (ref 30.0–36.0)
MCV: 94.4 fl (ref 78.0–100.0)
MONO ABS: 0.5 10*3/uL (ref 0.1–1.0)
Monocytes Relative: 9.9 % (ref 3.0–12.0)
NEUTROS PCT: 55.9 % (ref 43.0–77.0)
Neutro Abs: 2.6 10*3/uL (ref 1.4–7.7)
PLATELETS: 291 10*3/uL (ref 150.0–400.0)
RBC: 4.49 Mil/uL (ref 3.87–5.11)
RDW: 13 % (ref 11.5–15.5)
WBC: 4.6 10*3/uL (ref 4.0–10.5)

## 2018-01-28 LAB — BASIC METABOLIC PANEL
BUN: 13 mg/dL (ref 6–23)
CHLORIDE: 100 meq/L (ref 96–112)
CO2: 29 meq/L (ref 19–32)
Calcium: 10.2 mg/dL (ref 8.4–10.5)
Creatinine, Ser: 0.81 mg/dL (ref 0.40–1.20)
GFR: 77.85 mL/min (ref >60.00)
Glucose, Bld: 106 mg/dL — ABNORMAL HIGH (ref 70–99)
Potassium: 3.7 mEq/L (ref 3.5–5.1)
SODIUM: 138 meq/L (ref 135–145)

## 2018-01-28 LAB — TSH: TSH: 1.13 u[IU]/mL (ref 0.35–4.50)

## 2018-01-28 MED ORDER — DIPHENOXYLATE-ATROPINE 2.5-0.025 MG PO TABS: 1.0000 | ORAL_TABLET | Freq: Four times a day (QID) | ORAL | 0 refills | Status: DC | PRN

## 2018-01-28 MED ORDER — SACCHAROMYCES BOULARDII 250 MG PO CAPS: 250.0000 mg | ORAL_CAPSULE | Freq: Two times a day (BID) | ORAL | 0 refills | Status: DC

## 2018-01-28 NOTE — Assessment & Plan Note (Signed)
Meds renewed  Hold BP meds for 2 days

## 2018-01-28 NOTE — Assessment & Plan Note (Signed)
Post diverticulitis  - Lomotil C diff if reoccured

## 2018-01-28 NOTE — Patient Instructions (Addendum)
Hold BP meds for 2 days Chicken breast, rice, chicken soup, yogurt, toast and jelly until well   Diverticulosis  Diverticulosis is a condition that develops when small pouches (diverticula) form in the wall of the large intestine (colon). The colon is where water is absorbed and stool is formed. The pouches form when the inside layer of the colon pushes through weak spots in the outer layers of the colon. You may have a few pouches or many of them. What are the causes? The cause of this condition is not known. What increases the risk? The following factors may make you more likely to develop this condition:  Being older than age 40. Your risk for this condition increases with age. Diverticulosis is rare among people younger than age 66. By age 62, many people have it.  Eating a low-fiber diet.  Having frequent constipation.  Being overweight.  Not getting enough exercise.  Smoking.  Taking over-the-counter pain medicines, like aspirin and ibuprofen.  Having a family history of diverticulosis.  What are the signs or symptoms? In most people, there are no symptoms of this condition. If you do have symptoms, they may include:  Bloating.  Cramps in the abdomen.  Constipation or diarrhea.  Pain in the lower left side of the abdomen.  How is this diagnosed? This condition is most often diagnosed during an exam for other colon problems. Because diverticulosis usually has no symptoms, it often cannot be diagnosed independently. This condition may be diagnosed by:  Using a flexible scope to examine the colon (colonoscopy).  Taking an X-ray of the colon after dye has been put into the colon (barium enema).  Doing a CT scan.  How is this treated? You may not need treatment for this condition if you have never developed an infection related to diverticulosis. If you have had an infection before, treatment may include:  Eating a high-fiber diet. This may include eating more  fruits, vegetables, and grains.  Taking a fiber supplement.  Taking a live bacteria supplement (probiotic).  Taking medicine to relax your colon.  Taking antibiotic medicines.  Follow these instructions at home:  Drink 6-8 glasses of water or more each day to prevent constipation.  Try not to strain when you have a bowel movement.  If you have had an infection before: ? Eat more fiber as directed by your health care provider or your diet and nutrition specialist (dietitian). ? Take a fiber supplement or probiotic, if your health care provider approves.  Take over-the-counter and prescription medicines only as told by your health care provider.  If you were prescribed an antibiotic, take it as told by your health care provider. Do not stop taking the antibiotic even if you start to feel better.  Keep all follow-up visits as told by your health care provider. This is important. Contact a health care provider if:  You have pain in your abdomen.  You have bloating.  You have cramps.  You have not had a bowel movement in 3 days. Get help right away if:  Your pain gets worse.  Your bloating becomes very bad.  You have a fever or chills, and your symptoms suddenly get worse.  You vomit.  You have bowel movements that are bloody or black.  You have bleeding from your rectum. Summary  Diverticulosis is a condition that develops when small pouches (diverticula) form in the wall of the large intestine (colon).  You may have a few pouches or many of them.  This condition is most often diagnosed during an exam for other colon problems.  If you have had an infection related to diverticulosis, treatment may include increasing the fiber in your diet, taking supplements, or taking medicines. This information is not intended to replace advice given to you by your health care provider. Make sure you discuss any questions you have with your health care provider. Document  Released: 02/17/2004 Document Revised: 04/10/2016 Document Reviewed: 04/10/2016 Elsevier Interactive Patient Education  2017 [employer].

## 2018-01-28 NOTE — Progress Notes (Signed)
Subjective:  Patient ID: Tamara Charles, female    DOB: 31-Jul-1960  Age: 57 y.o. MRN: [id number]  CC: No chief complaint on file.   HPI Tamara Charles presents for post ER visit for diverticulitis Finished abx C/o diarrhea x3 d - night time is worse, gas  Outpatient Medications Prior to Visit  Medication Sig Dispense Refill  . aspirin EC 81 MG tablet Take 1 tablet (81 mg total) by mouth daily. 100 tablet 3  . HYDROcodone-acetaminophen (NORCO) 5-325 MG tablet Take 1 tablet by mouth every 6 (six) hours as needed for severe pain. 15 tablet 0  . naproxen sodium (ANAPROX) 220 MG tablet Take 220 mg by mouth 2 (two) times daily with a meal.    . nortriptyline (PAMELOR) 10 MG capsule Take 1 capsule (10 mg total) by mouth at bedtime. 90 capsule 2  . ondansetron (ZOFRAN) 4 MG tablet Take 1 tablet (4 mg total) by mouth every 6 (six) hours as needed for nausea or vomiting. 12 tablet 0  . triamcinolone cream (KENALOG) 0.1 % Apply 1 application topically 2 (two) times daily. 30 g 0  . triamterene-hydrochlorothiazide (MAXZIDE-25) 37.5-25 MG tablet TAKE 1 TABLET BY MOUTH EVERY DAY 90 tablet 3  . valACYclovir (VALTREX) 500 MG tablet Take 1 tablet (500 mg total) by mouth 2 (two) times daily. 20 tablet 3  . venlafaxine XR (EFFEXOR-XR) 150 MG 24 hr capsule TAKE 1 CAPSULE BY MOUTH DAILY WITH BREAKFAST. **NEED OV FOR FURTHER REFILLS** 90 capsule 0  . azithromycin (ZITHROMAX Z-PAK) 250 MG tablet As directed 6 each 0  . ciprofloxacin (CIPRO) 500 MG tablet Take 1 tablet (500 mg total) by mouth 2 (two) times daily. One po bid x 7 days 14 tablet 0  . metroNIDAZOLE (FLAGYL) 500 MG tablet Take 1 tablet (500 mg total) by mouth 2 (two) times daily. One po bid x 7 days 14 tablet 0   No facility-administered medications prior to visit.     ROS: Review of Systems  Constitutional: Negative for activity change, appetite change, chills, fatigue and unexpected weight change.  HENT: Negative for congestion, mouth  sores and sinus pressure.   Eyes: Negative for visual disturbance.  Respiratory: Negative for cough and chest tightness.   Gastrointestinal: Positive for diarrhea. Negative for abdominal pain and nausea.  Genitourinary: Negative for difficulty urinating, frequency and vaginal pain.  Musculoskeletal: Negative for back pain and gait problem.  Skin: Negative for pallor and rash.  Neurological: Negative for dizziness, tremors, weakness, numbness and headaches.  Psychiatric/Behavioral: Negative for confusion and sleep disturbance.    Objective:  BP 126/78 (BP Location: Left Arm, Patient Position: Sitting, Cuff Size: Normal)   Pulse 86   Temp 97.6 F (36.4 C) (Oral)   Ht 5\' 4"  (1.626 m)   Wt 167 lb (75.8 kg)   LMP 06/05/2008   SpO2 96%   BMI 28.67 kg/m   BP Readings from Last 3 Encounters:  01/28/18 126/78  01/20/18 116/68  10/23/17 130/82    Wt Readings from Last 3 Encounters:  01/28/18 167 lb (75.8 kg)  01/20/18 170 lb (77.1 kg)  10/23/17 170 lb (77.1 kg)    Physical Exam  Constitutional: She appears well-developed. No distress.  HENT:  Head: Normocephalic.  Right Ear: External ear normal.  Left Ear: External ear normal.  Nose: Nose normal.  Mouth/Throat: Oropharynx is clear and moist.  Eyes: Pupils are equal, round, and reactive to light. Conjunctivae are normal. Right eye exhibits no discharge. Left eye exhibits no  discharge.  Neck: Normal range of motion. Neck supple. No JVD present. No tracheal deviation present. No thyromegaly present.  Cardiovascular: Normal rate, regular rhythm and normal heart sounds.  Pulmonary/Chest: No stridor. No respiratory distress. She has no wheezes.  Abdominal: Soft. Bowel sounds are normal. She exhibits no distension and no mass. There is no tenderness. There is no rebound and no guarding.  Musculoskeletal: She exhibits no edema or tenderness.  Lymphadenopathy:    She has no cervical adenopathy.  Neurological: She displays normal  reflexes. No cranial nerve deficit. She exhibits normal muscle tone. Coordination normal.  Skin: No rash noted. No erythema.  Psychiatric: She has a normal mood and affect. Her behavior is normal. Judgment and thought content normal.    Lab Results  Component Value Date   WBC 8.5 01/20/2018   HGB 14.5 01/20/2018   HCT 41.7 01/20/2018   PLT 285 01/20/2018   GLUCOSE 109 (H) 01/20/2018   CHOL 229 (H) 06/27/2016   TRIG 143.0 06/27/2016   HDL 57.30 06/27/2016   LDLDIRECT 146.0 08/25/2014   LDLCALC 144 (H) 06/27/2016   ALT 16 01/20/2018   AST 20 01/20/2018   NA 139 01/20/2018   K 3.5 01/20/2018   CL 104 01/20/2018   CREATININE 0.63 01/20/2018   BUN 14 01/20/2018   CO2 27 01/20/2018   TSH 0.50 06/27/2016    Ct Abdomen Pelvis W Contrast  Result Date: 01/20/2018 CLINICAL DATA:  Left lower quadrant abdominal pain with nausea, vomiting, and diarrhea. EXAM: CT ABDOMEN AND PELVIS WITH CONTRAST TECHNIQUE: Multidetector CT imaging of the abdomen and pelvis was performed using the standard protocol following bolus administration of intravenous contrast. CONTRAST:  117mL ISOVUE-300 IOPAMIDOL (ISOVUE-300) INJECTION 61% COMPARISON:  None. FINDINGS: Lower chest: No acute abnormality. Hepatobiliary: Small amount of focal fat along the falciform ligament. No other focal liver abnormality is seen. No gallstones, gallbladder wall thickening, or biliary dilatation. Pancreas: Unremarkable. No pancreatic ductal dilatation or surrounding inflammatory changes. Spleen: Normal in size without focal abnormality. Adrenals/Urinary Tract: Adrenal glands are unremarkable. Kidneys are normal, without renal calculi, focal lesion, or hydronephrosis. Bladder is unremarkable. Stomach/Bowel: The stomach is within normal limits. Mild left-sided colonic diverticulosis with mild short segment descending colonic wall thickening and inflammatory changes surrounding several inflamed diverticula. Normal appendix. The small bowel is  unremarkable. Vascular/Lymphatic: Retroaortic left renal vein. No enlarged abdominal or pelvic lymph nodes. Reproductive: Status post hysterectomy. No adnexal masses. Other: No abdominal wall hernia or abnormality. No abdominopelvic ascites. No pneumoperitoneum. No drainable fluid collection. Musculoskeletal: No acute or significant osseous findings. Degenerative changes of the thoracolumbar spine. IMPRESSION: 1. Acute diverticulitis of the descending colon. No abscess or perforation. Electronically Signed   By: Titus Dubin M.D.   On: 01/20/2018 14:22    Assessment & Plan:   There are no diagnoses linked to this encounter.   No orders of the defined types were placed in this encounter.    Follow-up: No follow-ups on file.  Walker Kehr, MD

## 2018-01-28 NOTE — Assessment & Plan Note (Signed)
CT revied Discussed

## 2018-02-04 ENCOUNTER — Encounter (HOSPITAL_BASED_OUTPATIENT_CLINIC_OR_DEPARTMENT_OTHER): Payer: Self-pay

## 2018-02-04 ENCOUNTER — Emergency Department (HOSPITAL_BASED_OUTPATIENT_CLINIC_OR_DEPARTMENT_OTHER)
Admission: EM | Admit: 2018-02-04 | Discharge: 2018-02-04 | Disposition: A | Discharge status: Home / Self Care | Payer: Self-pay | Attending: Emergency Medicine | Admitting: Emergency Medicine

## 2018-02-04 ENCOUNTER — Emergency Department (HOSPITAL_BASED_OUTPATIENT_CLINIC_OR_DEPARTMENT_OTHER): Payer: Self-pay

## 2018-02-04 ENCOUNTER — Other Ambulatory Visit: Payer: Self-pay

## 2018-02-04 DIAGNOSIS — I10 Essential (primary) hypertension: Secondary | ICD-10-CM | POA: Insufficient documentation

## 2018-02-04 DIAGNOSIS — Z7982 Long term (current) use of aspirin: Secondary | ICD-10-CM | POA: Insufficient documentation

## 2018-02-04 DIAGNOSIS — Z79899 Other long term (current) drug therapy: Secondary | ICD-10-CM | POA: Insufficient documentation

## 2018-02-04 DIAGNOSIS — R1032 Left lower quadrant pain: Secondary | ICD-10-CM | POA: Insufficient documentation

## 2018-02-04 DIAGNOSIS — R197 Diarrhea, unspecified: Secondary | ICD-10-CM | POA: Insufficient documentation

## 2018-02-04 DIAGNOSIS — R1012 Left upper quadrant pain: Secondary | ICD-10-CM | POA: Insufficient documentation

## 2018-02-04 DIAGNOSIS — F17210 Nicotine dependence, cigarettes, uncomplicated: Secondary | ICD-10-CM | POA: Insufficient documentation

## 2018-02-04 LAB — CBC WITH DIFFERENTIAL/PLATELET
Basophils Absolute: 0 10*3/uL (ref 0.0–0.1)
Basophils Relative: 0 %
EOS PCT: 2 %
Eosinophils Absolute: 0.1 10*3/uL (ref 0.0–0.7)
HCT: 45.2 % (ref 36.0–46.0)
Hemoglobin: 15.7 g/dL — ABNORMAL HIGH (ref 12.0–15.0)
LYMPHS ABS: 1 10*3/uL (ref 0.7–4.0)
LYMPHS PCT: 16 %
MCH: 33.1 pg (ref 26.0–34.0)
MCHC: 34.7 g/dL (ref 30.0–36.0)
MCV: 95.4 fL (ref 78.0–100.0)
MONO ABS: 0.4 10*3/uL (ref 0.1–1.0)
Monocytes Relative: 6 %
Neutro Abs: 4.6 10*3/uL (ref 1.7–7.7)
Neutrophils Relative %: 76 %
PLATELETS: 310 10*3/uL (ref 150–400)
RBC: 4.74 MIL/uL (ref 3.87–5.11)
RDW: 12.2 % (ref 11.5–15.5)
WBC: 6.2 10*3/uL (ref 4.0–10.5)

## 2018-02-04 LAB — COMPREHENSIVE METABOLIC PANEL
ALT: 15 U/L (ref 0–44)
ANION GAP: 12 (ref 5–15)
AST: 21 U/L (ref 15–41)
Albumin: 4.6 g/dL (ref 3.5–5.0)
Alkaline Phosphatase: 49 U/L (ref 38–126)
BUN: 11 mg/dL (ref 6–20)
CHLORIDE: 102 mmol/L (ref 98–111)
CO2: 26 mmol/L (ref 22–32)
CREATININE: 0.59 mg/dL (ref 0.44–1.00)
Calcium: 9.8 mg/dL (ref 8.9–10.3)
GFR calc Af Amer: >60 mL/min (ref >60)
Glucose, Bld: 115 mg/dL — ABNORMAL HIGH (ref 70–99)
Potassium: 3.6 mmol/L (ref 3.5–5.1)
Sodium: 140 mmol/L (ref 135–145)
Total Bilirubin: 0.5 mg/dL (ref 0.3–1.2)
Total Protein: 7.8 g/dL (ref 6.5–8.1)

## 2018-02-04 LAB — URINALYSIS, ROUTINE W REFLEX MICROSCOPIC
Bilirubin Urine: NEGATIVE
Glucose, UA: NEGATIVE mg/dL
HGB URINE DIPSTICK: NEGATIVE
Ketones, ur: NEGATIVE mg/dL
LEUKOCYTES UA: NEGATIVE
Nitrite: NEGATIVE
PROTEIN: NEGATIVE mg/dL
Specific Gravity, Urine: 1.01 (ref 1.005–1.030)
pH: 8 (ref 5.0–8.0)

## 2018-02-04 LAB — LIPASE, BLOOD: LIPASE: 35 U/L (ref 11–51)

## 2018-02-04 LAB — OCCULT BLOOD X 1 CARD TO LAB, STOOL: Fecal Occult Bld: NEGATIVE

## 2018-02-04 MED ORDER — ONDANSETRON HCL 4 MG/2ML IJ SOLN
4.0000 mg | Freq: Once | INTRAMUSCULAR | Status: AC
  Administered 2018-02-04: 4 mg via INTRAVENOUS
  Filled 2018-02-04: qty 2

## 2018-02-04 MED ORDER — DICYCLOMINE HCL 10 MG PO CAPS
20.0000 mg | ORAL_CAPSULE | Freq: Once | ORAL | Status: AC
  Administered 2018-02-04: 20 mg via ORAL
  Filled 2018-02-04: qty 2

## 2018-02-04 MED ORDER — MORPHINE SULFATE (PF) 4 MG/ML IV SOLN
4.0000 mg | Freq: Once | INTRAVENOUS | Status: AC
  Administered 2018-02-04: 4 mg via INTRAVENOUS
  Filled 2018-02-04: qty 1

## 2018-02-04 MED ORDER — SODIUM CHLORIDE 0.9 % IV BOLUS
1000.0000 mL | Freq: Once | INTRAVENOUS | Status: AC
  Administered 2018-02-04: 1000 mL via INTRAVENOUS

## 2018-02-04 MED ORDER — DICYCLOMINE HCL 20 MG PO TABS: 20.0000 mg | ORAL_TABLET | Freq: Two times a day (BID) | ORAL | 0 refills | Status: DC

## 2018-02-04 MED ORDER — IOPAMIDOL (ISOVUE-300) INJECTION 61%
100.0000 mL | Freq: Once | INTRAVENOUS | Status: AC | PRN
  Administered 2018-02-04: 100 mL via INTRAVENOUS

## 2018-02-04 NOTE — ED Notes (Signed)
ED Provider at bedside. 

## 2018-02-04 NOTE — ED Notes (Signed)
Pt transported to CT at this time.

## 2018-02-04 NOTE — ED Notes (Signed)
Pt made aware that urine and stool specimens are needed at this time.

## 2018-02-04 NOTE — ED Triage Notes (Signed)
C/o left side abd pain-recent dx with diverticulitis-states pain is worse-NAD-steady gait

## 2018-02-04 NOTE — Discharge Instructions (Signed)
Scan shows diverticulitis is improving and labs overall look good.  May have some spasming from diarrhea as well as some irritation of your stomach lining.  Use Bentyl as needed for pain, and begin taking Pepcid twice daily 30 minutes before breakfast and dinner.  If symptoms and pain persist please follow-up with your primary care doctor for reevaluation.  Actually if diarrhea persists as we are unable to get stool sample today.  Return to the emergency department for significantly worsening pain, fevers, persistent vomiting, blood in your emesis or stools or any other new or concerning symptoms.

## 2018-02-04 NOTE — ED Provider Notes (Signed)
[facility] EMERGENCY DEPARTMENT Provider Note   CSN: [id number] Arrival date & time: 02/04/18  1123     History   Chief Complaint Chief Complaint  Patient presents with  . Abdominal Pain    HPI Tamara Charles is a 57 y.o. female.  Tamara Charles is a 57 y.o. Female with history of hypertension, spontaneous pneumothorax and diverticulitis, who presents to the emergency department for evaluation of abdominal pain.  Patient was recently diagnosed and treated for diverticulitis on 8/18, reports pain was initially improving, but then she began having persistent diarrhea about a week ago, was prescribed an antidiarrheal by her primary care doctor that stopped bowel movements for 3 to 4 days then yesterday she was able to have a solid bowel movement followed by several episodes of diarrhea, denies hematochezia, but does report some dark stools intermittently.  Last night and today she has began having severe left-sided abdominal pain once again in both the left upper and lower quadrants, pain is constant and nonradiating.  No associated fevers, she is felt very nauseated but has been able to eat without vomiting.  Denies dysuria or urinary frequency.  History of hysterectomy, no vaginal bleeding or discharge.  No chest pain or shortness of breath.  No medications for symptoms prior to arrival.  CP had ordered C. difficile test last week but patient unable to provide stool sample.     Past Medical History:  Diagnosis Date  . Hypertension   . Spontaneous pneumothorax    at 57 yo    Patient Active Problem List   Diagnosis Date Noted  . Diverticulitis 01/28/2018  . Diarrhea 01/28/2018  . Rash and nonspecific skin eruption 10/23/2017  . Hot flash not due to menopause 06/26/2016  . Herpes simplex 04/20/2015  . Weight gain 08/25/2014  . Well adult exam 02/23/2014  . Bleb, lung ([place]) 02/23/2014  . Family history of early CAD 02/23/2014  . Tobacco use disorder 02/23/2014  .  Hypertension 04/21/2013  . Spontaneous pneumothorax 04/21/2013    Past Surgical History:  Procedure Laterality Date  . ABDOMINAL HYSTERECTOMY     partial  . BREAST SURGERY Bilateral    breast reduction  . LUNG SURGERY Bilateral 1997  . REDUCTION MAMMAPLASTY       OB History   None      Home Medications    Prior to Admission medications   Medication Sig Start Date End Date Taking? Authorizing Provider  aspirin EC 81 MG tablet Take 1 tablet (81 mg total) by mouth daily. 02/23/14   Plotnikov, Evie Lacks, MD  dicyclomine (BENTYL) 20 MG tablet Take 1 tablet (20 mg total) by mouth 2 (two) times daily. 02/04/18   Jacqlyn Larsen, PA-C  diphenoxylate-atropine (LOMOTIL) 2.5-0.025 MG tablet Take 1-2 tablets by mouth 4 (four) times daily as needed for diarrhea or loose stools. 01/28/18   Plotnikov, Evie Lacks, MD  naproxen sodium (ANAPROX) 220 MG tablet Take 220 mg by mouth 2 (two) times daily with a meal.    [provider]  nortriptyline (PAMELOR) 10 MG capsule Take 1 capsule (10 mg total) by mouth at bedtime. 06/26/16   Plotnikov, Evie Lacks, MD  ondansetron (ZOFRAN) 4 MG tablet Take 1 tablet (4 mg total) by mouth every 6 (six) hours as needed for nausea or vomiting. 01/20/18   Julianne Rice, MD  saccharomyces boulardii (FLORASTOR) 250 MG capsule Take 1 capsule (250 mg total) by mouth 2 (two) times daily. 01/28/18   Plotnikov, Evie Lacks, MD  triamcinolone cream (KENALOG) 0.1 % Apply 1 application topically 2 (two) times daily. 10/09/17   Burchette, Alinda Sierras, MD  triamterene-hydrochlorothiazide (MAXZIDE-25) 37.5-25 MG tablet TAKE 1 TABLET BY MOUTH EVERY DAY 04/29/17   Plotnikov, Evie Lacks, MD  valACYclovir (VALTREX) 500 MG tablet Take 1 tablet (500 mg total) by mouth 2 (two) times daily. 06/26/16   Plotnikov, Evie Lacks, MD  venlafaxine XR (EFFEXOR-XR) 150 MG 24 hr capsule TAKE 1 CAPSULE BY MOUTH DAILY WITH BREAKFAST. **NEED OV FOR FURTHER REFILLS** 07/04/17   Plotnikov, Evie Lacks, MD     Family History Family History  Problem Relation Age of Onset  . Heart disease Mother   . Hypertension Mother   . Heart disease Brother   . Breast cancer Maternal Aunt     Social History Social History   Tobacco Use  . Smoking status: Current Every Day Smoker    Packs/day: 0.50    Types: Cigarettes  . Smokeless tobacco: Never Used  Substance Use Topics  . Alcohol use: Yes    Comment: occ  . Drug use: Yes    Frequency: 7.0 times per week    Types: Marijuana     Allergies   Patient has no known allergies.   Review of Systems Review of Systems  Constitutional: Negative for chills and fever.  HENT: Negative.   Eyes: Negative for visual disturbance.  Respiratory: Negative for cough and shortness of breath.   Cardiovascular: Negative for chest pain.  Gastrointestinal: Positive for abdominal pain, diarrhea and nausea. Negative for blood in stool, constipation and vomiting.  Genitourinary: Negative for dysuria and frequency.  Musculoskeletal: Negative for arthralgias, back pain and myalgias.  Skin: Negative for color change and rash.  Neurological: Negative for dizziness, syncope and light-headedness.     Physical Exam Updated Vital Signs BP (!) 161/89 (BP Location: Left Arm)   Pulse 93   Temp 97.7 F (36.5 C) (Oral)   Resp 18   Ht 5\' 4"  (1.626 m)   Wt 75.9 kg   LMP 06/05/2008   SpO2 98%   BMI 28.72 kg/m   Physical Exam  Constitutional: She is oriented to person, place, and time. She appears well-developed and well-nourished. No distress.  HENT:  Head: Normocephalic and atraumatic.  Mouth/Throat: Oropharynx is clear and moist.  Eyes: Pupils are equal, round, and reactive to light. Right eye exhibits no discharge. Left eye exhibits no discharge.  Neck: Neck supple.  Cardiovascular: Normal rate, regular rhythm, normal heart sounds and intact distal pulses. Exam reveals no gallop and no friction rub.  No murmur heard. Pulmonary/Chest: Effort normal and  breath sounds normal. No respiratory distress.  Respirations equal and unlabored, patient able to speak in full sentences, lungs clear to auscultation bilaterally  Abdominal: Soft. Normal appearance and bowel sounds are normal. She exhibits no distension. There is tenderness in the left upper quadrant and left lower quadrant. There is guarding. There is no rigidity, no rebound and no CVA tenderness.  Abdomen soft and nondistended, bowel sounds present throughout, there is focal tenderness in the left upper and lower quadrants with mild guarding, no rebound tenderness, right side of the abdomen is unremarkable.  No CVA tenderness bilaterally.  Musculoskeletal: She exhibits no edema.  Neurological: She is alert and oriented to person, place, and time. Coordination normal.  Skin: Skin is warm and dry. She is not diaphoretic.  Psychiatric: She has a normal mood and affect. Her behavior is normal.  Nursing note and vitals reviewed.  ED Treatments / Results  Labs (all labs ordered are listed, but only abnormal results are displayed) Labs Reviewed  CBC WITH DIFFERENTIAL/PLATELET - Abnormal; Notable for the following components:      Result Value   Hemoglobin 15.7 (*)    All other components within normal limits  COMPREHENSIVE METABOLIC PANEL - Abnormal; Notable for the following components:   Glucose, Bld 115 (*)    All other components within normal limits  URINALYSIS, ROUTINE W REFLEX MICROSCOPIC - Abnormal; Notable for the following components:   Color, Urine STRAW (*)    All other components within normal limits  C DIFFICILE QUICK SCREEN W PCR REFLEX  GASTROINTESTINAL PANEL BY PCR, STOOL (REPLACES STOOL CULTURE)  LIPASE, BLOOD  OCCULT BLOOD X 1 CARD TO LAB, STOOL  POC OCCULT BLOOD, ED    EKG None  Radiology Ct Abdomen Pelvis W Contrast  Result Date: 02/04/2018 CLINICAL DATA:  Pt c/o continuing and worsening left side abd pain, pt states she had a recent diverticulitis infection  and has completed the antibiotic without relief, HTN, hysterectomy, lung surgery, breast reduction EXAM: CT ABDOMEN AND PELVIS WITH CONTRAST TECHNIQUE: Multidetector CT imaging of the abdomen and pelvis was performed using the standard protocol following bolus administration of intravenous contrast. CONTRAST:  175mL ISOVUE-300 IOPAMIDOL (ISOVUE-300) INJECTION 61% COMPARISON:  01/20/2018 FINDINGS: Lower chest: No acute abnormality. Hepatobiliary: No focal liver abnormality is seen. No radiopaque gallstones, biliary dilatation, or pericholecystic inflammatory changes. Pancreas: Unremarkable. No pancreatic ductal dilatation or surrounding inflammatory changes. Spleen: Normal in size without focal abnormality. Adrenals/Urinary Tract: Adrenal glands are unremarkable. Kidneys are normal, without renal calculi, focal lesion, or hydronephrosis. Bladder is unremarkable. Stomach/Bowel: Stomach and small bowel loops are normal in appearance. The appendix is well seen and has a normal appearance. The colon contains numerous diverticula. There has been improvement in the appearance of inflammatory change adjacent to the LOWER descending colon. No evidence for perforation or abscess. Vascular/Lymphatic: No significant vascular findings are present. Small mesenteric lymph nodes are likely reactive. Reproductive: Hysterectomy.  No adnexal mass. Other: No free pelvic fluid. Anterior abdominal wall is unremarkable. Musculoskeletal: Degenerative changes at L5-S1. IMPRESSION: 1. Improved appearance of acute diverticulitis of the LOWER descending colon. 2. Persistent significant colonic diverticular disease without new inflammatory change. 3. There is no abscess or evidence for perforation. 4. Normal appendix. Electronically Signed   By: Nolon Nations M.D.   On: 02/04/2018 12:34    Procedures Procedures (including critical care time)  Medications Ordered in ED Medications  dicyclomine (BENTYL) capsule 20 mg (has no  administration in time range)  sodium chloride 0.9 % bolus 1,000 mL (1,000 mLs Intravenous New Bag/Given 02/04/18 1157)  ondansetron (ZOFRAN) injection 4 mg (4 mg Intravenous Given 02/04/18 1158)  morphine 4 MG/ML injection 4 mg (4 mg Intravenous Given 02/04/18 1158)  iopamidol (ISOVUE-300) 61 % injection 100 mL (100 mLs Intravenous Contrast Given 02/04/18 1206)     Initial Impression / Assessment and Plan / ED Course  I have reviewed the triage vital signs and the nursing notes.  Pertinent labs & imaging results that were available during my care of the patient were reviewed by me and considered in my medical decision making (see chart for details).  Patient presents for evaluation of left lower quadrant abdominal pain, she is also been experiencing some diarrhea intermittently.  On 8/18 she was diagnosed with and treated for diverticulitis with improvement in symptoms, but reports she now has had returning and worsening left lower quadrant  pain.  No fevers, associated nausea without vomiting.  No melena or hematochezia.  Saw PCP regarding diarrhea last week, has been unable to provide stool sample for C. Difficile.  On exam patient mildly hypertensive but otherwise vitals are stable.  Abdomen with tenderness and guarding in the left upper and lower quadrants.  Rectal exam is unremarkable, Hemoccult negative.  Will get abdominal labs and CT abdomen pelvis to look for any worsening diverticulitis, abscess or perforation.  Labs are reassuring, no leukocytosis, normal hemoglobin, no acute electrolyte derangements, normal renal and liver function, normal lipase.  Urinalysis shows no hematuria no signs of infection.  Patient unable to provide stool sample for GI pathogen panel or C. difficile testing.  CT scan shows improved diverticulitis with no evidence of perforation or abscess and no other abnormalities noted.  On reevaluation after symptomatic treatment patient reports improvement in pain, she is  tolerating p.o., with no further bowel movements.  Will treat with Bentyl and have patient follow-up with primary care.  Feel patient is stable for discharge home at this time.  Return precautions discussed.  Patient expresses understanding and is in agreement with plan.  Case discussed with Dr. Tyrone Nine who is in agreement with plan.  Final Clinical Impressions(s) / ED Diagnoses   Final diagnoses:  Left lower quadrant pain    ED Discharge Orders         Ordered    dicyclomine (BENTYL) 20 MG tablet  2 times daily     02/04/18 1322           Jacqlyn Larsen, [place] 02/06/18 [place], [place], DO 02/06/18 1504

## 2018-02-06 ENCOUNTER — Other Ambulatory Visit: Payer: Self-pay | Admitting: Family Medicine

## 2018-02-13 ENCOUNTER — Other Ambulatory Visit: Payer: Self-pay | Admitting: Internal Medicine

## 2018-02-18 ENCOUNTER — Other Ambulatory Visit: Payer: Self-pay

## 2018-03-29 ENCOUNTER — Other Ambulatory Visit: Payer: Self-pay | Admitting: Internal Medicine

## 2018-03-29 DIAGNOSIS — B009 Herpesviral infection, unspecified: Secondary | ICD-10-CM

## 2018-03-29 MED ORDER — VALACYCLOVIR HCL 500 MG PO TABS: 500.0000 mg | ORAL_TABLET | Freq: Two times a day (BID) | ORAL | 3 refills | Status: DC

## 2018-03-29 NOTE — Telephone Encounter (Signed)
Copied from [place] [id number]. Topic: Quick Communication - Rx Refill/Question >> Mar 29, 2018 10:22 AM Scherrie Gerlach wrote: Medication: valACYclovir (VALTREX) 500 MG tablet Pt states she has an outbreak and needs please [id number] [address], [place] [phone] (Phone) [phone] (Fax)

## 2018-03-29 NOTE — Telephone Encounter (Signed)
Requested medication (s) are due for refill today: Yes  Requested medication (s) are on the active medication list: Yes  Last refill:  06/26/16  Future visit scheduled: Yes  Notes to clinic:  Expired Rx, unable to refill.     Requested Prescriptions  Pending Prescriptions Disp Refills   valACYclovir (VALTREX) 500 MG tablet 20 tablet 3    Sig: Take 1 tablet (500 mg total) by mouth 2 (two) times daily.     Antimicrobials:  Antiviral Agents - Anti-Herpetic Passed - 03/29/2018 11:01 AM      Passed - Valid encounter within last 12 months    Recent Outpatient Visits          2 months ago Diverticulitis   [place] Plotnikov, Evie Lacks, MD   5 months ago Rash and nonspecific skin eruption   [place], MD   5 months ago Skin rash   [facility]rap[facility] at [employer], Alinda Sierras, MD   1 year ago Well adult exam   [place] Plotnikov, Evie Lacks, MD   2 years ago Herpes simplex   [place], FNP

## 2018-03-29 NOTE — Telephone Encounter (Signed)
Patient called and asked about the outbreak requiring the refill of Valtrex. She says it's a tiny area on the lip of her vagina, just one spot, nothing bad, but a little uncomfortable. She says she has 4 pills left. I advised it could take up to 3 days to refill, she verbalized understanding.

## 2018-04-24 ENCOUNTER — Other Ambulatory Visit: Payer: Self-pay | Admitting: Internal Medicine

## 2018-06-25 NOTE — Progress Notes (Signed)
Subjective:    Patient ID: Tamara Charles, female    DOB: Jun 18, 1960, 58 y.o.   MRN: [id number]  HPI The patient is here for an acute visit.  She has had a bad cold for couple of weeks and still has some cold symptoms.  She thought the cold was getting better.  She does smoke.  She came in today because she is experiencing chills and pain on her left side that started yesterday.  She did see her grandkids last week and is unsure if she got something from them.  She states subjective fever, chills, decreased appetite, nasal congestion, sinus pain, cough that is productive of brown sputum at times but better than it has been, mild shortness of breath, lower abdominal pain, nausea, diarrhea, pain from her left mid back to her left side, lightheadedness/dizziness and headaches.  The pain in her left side is dull and cramping.  She denies any new activities and it is not affected with activity or change in position.  It is constant.  She denies any dysuria, increased urinary frequency or hematuria.   She does have a history of diverticulitis-she had her first attack a few months ago.   Medications and allergies reviewed with patient and updated if appropriate.  Patient Active Problem List   Diagnosis Date Noted  . Diverticulitis 01/28/2018  . Diarrhea 01/28/2018  . Rash and nonspecific skin eruption 10/23/2017  . Hot flash not due to menopause 06/26/2016  . Herpes simplex 04/20/2015  . Weight gain 08/25/2014  . Well adult exam 02/23/2014  . Bleb, lung ([place]) 02/23/2014  . Family history of early CAD 02/23/2014  . Tobacco use disorder 02/23/2014  . Hypertension 04/21/2013  . Spontaneous pneumothorax 04/21/2013    Current Outpatient Medications on File Prior to Visit  Medication Sig Dispense Refill  . aspirin EC 81 MG tablet Take 1 tablet (81 mg total) by mouth daily. 100 tablet 3  . diphenoxylate-atropine (LOMOTIL) 2.5-0.025 MG tablet Take 1-2 tablets by mouth 4 (four) times  daily as needed for diarrhea or loose stools. 60 tablet 0  . naproxen sodium (ANAPROX) 220 MG tablet Take 220 mg by mouth 2 (two) times daily with a meal.    . nortriptyline (PAMELOR) 10 MG capsule TAKE 1 CAPSULE (10 MG TOTAL) BY MOUTH AT BEDTIME. 90 capsule 2  . triamcinolone cream (KENALOG) 0.1 % Apply 1 application topically 2 (two) times daily. 30 g 0  . triamterene-hydrochlorothiazide (MAXZIDE-25) 37.5-25 MG tablet TAKE 1 TABLET BY MOUTH EVERY DAY 90 tablet 0  . valACYclovir (VALTREX) 500 MG tablet Take 1 tablet (500 mg total) by mouth 2 (two) times daily. 20 tablet 3  . venlafaxine XR (EFFEXOR-XR) 150 MG 24 hr capsule TAKE 1 CAPSULE BY MOUTH DAILY WITH BREAKFAST. **NEED OV FOR FURTHER REFILLS** 90 capsule 0   No current facility-administered medications on file prior to visit.     Past Medical History:  Diagnosis Date  . Hypertension   . Spontaneous pneumothorax    at 58 yo    Past Surgical History:  Procedure Laterality Date  . ABDOMINAL HYSTERECTOMY     partial  . BREAST SURGERY Bilateral    breast reduction  . LUNG SURGERY Bilateral 1997  . REDUCTION MAMMAPLASTY      Social History   Socioeconomic History  . Marital status: Married    Spouse name: Not on file  . Number of children: Not on file  . Years of education: Not on file  .  Highest education level: Not on file  Occupational History  . Not on file  Social Needs  . Financial resource strain: Not on file  . Food insecurity:    Worry: Not on file    Inability: Not on file  . Transportation needs:    Medical: Not on file    Non-medical: Not on file  Tobacco Use  . Smoking status: Current Every Day Smoker    Packs/day: 0.50    Types: Cigarettes  . Smokeless tobacco: Never Used  Substance and Sexual Activity  . Alcohol use: Yes    Comment: occ  . Drug use: Yes    Frequency: 7.0 times per week    Types: Marijuana  . Sexual activity: Yes  Lifestyle  . Physical activity:    Days per week: Not on file      Minutes per session: Not on file  . Stress: Not on file  Relationships  . Social connections:    Talks on phone: Not on file    Gets together: Not on file    Attends religious service: Not on file    Active member of club or organization: Not on file    Attends meetings of clubs or organizations: Not on file    Relationship status: Not on file  Other Topics Concern  . Not on file  Social History Narrative  . Not on file    Family History  Problem Relation Age of Onset  . Heart disease Mother   . Hypertension Mother   . Heart disease Brother   . Breast cancer Maternal Aunt     Review of Systems  Constitutional: Positive for appetite change (dec), chills and fever (subjective yesterday).  HENT: Positive for congestion and sinus pain. Negative for ear pain (clogged) and sore throat.   Respiratory: Positive for cough (productive - brown sputum - better) and shortness of breath (mild). Negative for wheezing.   Gastrointestinal: Positive for abdominal pain (lower abdomen), diarrhea and nausea. Negative for constipation and vomiting.  Genitourinary: Positive for flank pain (left ). Negative for dysuria, frequency and hematuria.  Neurological: Positive for dizziness, light-headedness and headaches. Negative for numbness.       Objective:   Vitals:   06/26/18 0928  BP: 138/82  Pulse: 80  Resp: 18  Temp: 98.3 F (36.8 C)  SpO2: 96%   BP Readings from Last 3 Encounters:  06/26/18 138/82  02/04/18 122/69  01/28/18 126/78   Wt Readings from Last 3 Encounters:  06/26/18 169 lb (76.7 kg)  02/04/18 167 lb 5.3 oz (75.9 kg)  01/28/18 167 lb (75.8 kg)   Body mass index is 29.01 kg/m.   Physical Exam    GENERAL APPEARANCE: Appears stated age, well appearing, NAD EYES: conjunctiva clear, no icterus HEENT: bilateral tympanic membranes and ear canals normal, oropharynx with no erythema, no thyromegaly, trachea midline, no cervical or supraclavicular lymphadenopathy LUNGS:  Clear to auscultation without wheeze or crackles, unlabored breathing, good air entry bilaterally CARDIOVASCULAR: Normal S1,S2 without murmurs, no edema Abdomen: Soft, tenderness left lower quadrant, suprapubic region that is mild without rebound or guarding Musculoskeletal: No CVA tenderness, no tenderness with palpation left-sided back or side SKIN: Warm, dry, no rash      Assessment & Plan:    See Problem List for Assessment and Plan of chronic medical problems.

## 2018-06-26 ENCOUNTER — Ambulatory Visit (INDEPENDENT_AMBULATORY_CARE_PROVIDER_SITE_OTHER): Payer: Self-pay | Admitting: Internal Medicine

## 2018-06-26 ENCOUNTER — Encounter: Payer: Self-pay | Admitting: Internal Medicine

## 2018-06-26 ENCOUNTER — Other Ambulatory Visit (INDEPENDENT_AMBULATORY_CARE_PROVIDER_SITE_OTHER): Payer: Self-pay

## 2018-06-26 VITALS — BP 138/82 | HR 80 | Temp 98.3°F | Resp 18 | Ht 64.0 in | Wt 169.0 lb

## 2018-06-26 DIAGNOSIS — R1030 Lower abdominal pain, unspecified: Secondary | ICD-10-CM

## 2018-06-26 DIAGNOSIS — R109 Unspecified abdominal pain: Secondary | ICD-10-CM

## 2018-06-26 DIAGNOSIS — R10A2 Flank pain, left side: Secondary | ICD-10-CM | POA: Insufficient documentation

## 2018-06-26 DIAGNOSIS — J209 Acute bronchitis, unspecified: Secondary | ICD-10-CM

## 2018-06-26 DIAGNOSIS — R197 Diarrhea, unspecified: Secondary | ICD-10-CM

## 2018-06-26 LAB — CBC WITH DIFFERENTIAL/PLATELET
Basophils Absolute: 0 10*3/uL (ref 0.0–0.1)
Basophils Relative: 0.5 % (ref 0.0–3.0)
EOS PCT: 1.3 % (ref 0.0–5.0)
Eosinophils Absolute: 0.1 10*3/uL (ref 0.0–0.7)
HEMATOCRIT: 40.9 % (ref 36.0–46.0)
HEMOGLOBIN: 14.4 g/dL (ref 12.0–15.0)
LYMPHS PCT: 19.3 % (ref 12.0–46.0)
Lymphs Abs: 1.7 10*3/uL (ref 0.7–4.0)
MCHC: 35.2 g/dL (ref 30.0–36.0)
MCV: 95.8 fl (ref 78.0–100.0)
MONOS PCT: 11.2 % (ref 3.0–12.0)
Monocytes Absolute: 1 10*3/uL (ref 0.1–1.0)
Neutro Abs: 5.9 10*3/uL (ref 1.4–7.7)
Neutrophils Relative %: 67.7 % (ref 43.0–77.0)
Platelets: 282 10*3/uL (ref 150.0–400.0)
RBC: 4.27 Mil/uL (ref 3.87–5.11)
RDW: 12.7 % (ref 11.5–15.5)
WBC: 8.7 10*3/uL (ref 4.0–10.5)

## 2018-06-26 LAB — COMPREHENSIVE METABOLIC PANEL
ALBUMIN: 4.6 g/dL (ref 3.5–5.2)
ALK PHOS: 53 U/L (ref 39–117)
ALT: 8 U/L (ref 0–35)
AST: 11 U/L (ref 0–37)
BUN: 14 mg/dL (ref 6–23)
CO2: 30 mEq/L (ref 19–32)
Calcium: 10.2 mg/dL (ref 8.4–10.5)
Chloride: 99 mEq/L (ref 96–112)
Creatinine, Ser: 0.75 mg/dL (ref 0.40–1.20)
GFR: 79.93 mL/min (ref >60.00)
GLUCOSE: 107 mg/dL — AB (ref 70–99)
Potassium: 3.9 mEq/L (ref 3.5–5.1)
Sodium: 137 mEq/L (ref 135–145)
TOTAL PROTEIN: 7.8 g/dL (ref 6.0–8.3)
Total Bilirubin: 0.7 mg/dL (ref 0.2–1.2)

## 2018-06-26 LAB — URINALYSIS, ROUTINE W REFLEX MICROSCOPIC
Bilirubin Urine: NEGATIVE
HGB URINE DIPSTICK: NEGATIVE
Ketones, ur: NEGATIVE
Nitrite: POSITIVE — AB
RBC / HPF: NONE SEEN (ref 0–?)
Specific Gravity, Urine: 1.01 (ref 1.000–1.030)
UROBILINOGEN UA: 1 (ref 0.0–1.0)
Urine Glucose: NEGATIVE
pH: 8 (ref 5.0–8.0)

## 2018-06-26 MED ORDER — AMOXICILLIN-POT CLAVULANATE 875-125 MG PO TABS: 1.0000 | ORAL_TABLET | Freq: Two times a day (BID) | ORAL | 0 refills | Status: DC

## 2018-06-26 NOTE — Assessment & Plan Note (Signed)
With her other symptoms concern for possible early diverticulitis, which she had for the first time in August of last year Discussed imaging-we deferred We will treat empirically with Augmentin given the other possible causes of her symptoms If there is no improvement I advised that she will need a CT scan Check CBC, CMP

## 2018-06-26 NOTE — Assessment & Plan Note (Signed)
No rash so unlikely shingles, but advised her to monitor because she could have early shingles Possible UTI versus renal stone, but no urinary symptoms Will check urinalysis, urine culture given lower abdominal pain Possibly musculoskeletal, but seems less likely Further evaluation depending on urine results

## 2018-06-26 NOTE — Patient Instructions (Addendum)
  Tests ordered today. Your results will be released to [facility] (or called to you) after review, usually within 72hours after test completion. If any changes need to be made, you will be notified at that same time.    Medications reviewed and updated.  Changes include :   Start augmentin for your symptoms.  Your prescription(s) have been submitted to your pharmacy. Please take as directed and contact our office if you believe you are having problem(s) with the medication(s).    Call if no improvement

## 2018-06-26 NOTE — Assessment & Plan Note (Signed)
She has been having cold symptoms for the past 2 weeks and her cold symptoms she thought was improving and that is not why she is actually here Cold symptoms could be contributing to some of the fevers and chills, but I think most likely the fevers and chills are related to her intra-abdominal process Given her differential diagnosis will start on Augmentin, which may help with her upper respiratory symptoms Over-the-counter cold medications as needed

## 2018-06-26 NOTE — Assessment & Plan Note (Signed)
Having lower abdominal pain and is tender on exam Has had some diarrhea and nausea, subjective fevers and chills with decreased appetite This may be another episode of diverticulitis She has no urinary tract symptoms, but is experiencing some left flank/mid back pain and could possibly have a UTI Will check CBC, CMP, urinalysis, urine culture Discussed imaging, but we decided to empirically start her on Augmentin, which would likely treat her upper respiratory infection if bacterial, early diverticulitis and hopefully UTI

## 2018-06-28 LAB — URINE CULTURE
MICRO NUMBER:: 88953
SPECIMEN QUALITY:: ADEQUATE

## 2018-07-17 ENCOUNTER — Other Ambulatory Visit: Payer: Self-pay | Admitting: Internal Medicine

## 2018-12-03 ENCOUNTER — Encounter (HOSPITAL_BASED_OUTPATIENT_CLINIC_OR_DEPARTMENT_OTHER): Payer: Self-pay

## 2018-12-03 ENCOUNTER — Other Ambulatory Visit: Payer: Self-pay

## 2018-12-03 ENCOUNTER — Emergency Department (HOSPITAL_BASED_OUTPATIENT_CLINIC_OR_DEPARTMENT_OTHER)
Admission: EM | Admit: 2018-12-03 | Discharge: 2018-12-03 | Disposition: A | Discharge status: Home / Self Care | Payer: Self-pay | Attending: Emergency Medicine | Admitting: Emergency Medicine

## 2018-12-03 DIAGNOSIS — R1032 Left lower quadrant pain: Secondary | ICD-10-CM

## 2018-12-03 DIAGNOSIS — Z79899 Other long term (current) drug therapy: Secondary | ICD-10-CM | POA: Insufficient documentation

## 2018-12-03 DIAGNOSIS — I10 Essential (primary) hypertension: Secondary | ICD-10-CM | POA: Insufficient documentation

## 2018-12-03 DIAGNOSIS — F17210 Nicotine dependence, cigarettes, uncomplicated: Secondary | ICD-10-CM | POA: Insufficient documentation

## 2018-12-03 DIAGNOSIS — K5792 Diverticulitis of intestine, part unspecified, without perforation or abscess without bleeding: Secondary | ICD-10-CM | POA: Insufficient documentation

## 2018-12-03 LAB — COMPREHENSIVE METABOLIC PANEL
ALT: 11 U/L (ref 0–44)
AST: 17 U/L (ref 15–41)
Albumin: 4.2 g/dL (ref 3.5–5.0)
Alkaline Phosphatase: 52 U/L (ref 38–126)
Anion gap: 11 (ref 5–15)
BUN: 15 mg/dL (ref 6–20)
CO2: 25 mmol/L (ref 22–32)
Calcium: 9.3 mg/dL (ref 8.9–10.3)
Chloride: 102 mmol/L (ref 98–111)
Creatinine, Ser: 0.63 mg/dL (ref 0.44–1.00)
GFR calc Af Amer: >60 mL/min (ref >60)
GFR calc non Af Amer: >60 mL/min (ref >60)
Glucose, Bld: 121 mg/dL — ABNORMAL HIGH (ref 70–99)
Potassium: 3.6 mmol/L (ref 3.5–5.1)
Sodium: 138 mmol/L (ref 135–145)
Total Bilirubin: 0.8 mg/dL (ref 0.3–1.2)
Total Protein: 6.8 g/dL (ref 6.5–8.1)

## 2018-12-03 LAB — CBC WITH DIFFERENTIAL/PLATELET
Abs Immature Granulocytes: 0.03 10*3/uL (ref 0.00–0.07)
Basophils Absolute: 0.1 10*3/uL (ref 0.0–0.1)
Basophils Relative: 1 %
Eosinophils Absolute: 0.2 10*3/uL (ref 0.0–0.5)
Eosinophils Relative: 2 %
HCT: 41.2 % (ref 36.0–46.0)
Hemoglobin: 13.8 g/dL (ref 12.0–15.0)
Immature Granulocytes: 0 %
Lymphocytes Relative: 20 %
Lymphs Abs: 1.7 10*3/uL (ref 0.7–4.0)
MCH: 32.8 pg (ref 26.0–34.0)
MCHC: 33.5 g/dL (ref 30.0–36.0)
MCV: 97.9 fL (ref 80.0–100.0)
Monocytes Absolute: 0.7 10*3/uL (ref 0.1–1.0)
Monocytes Relative: 7 %
Neutro Abs: 6.1 10*3/uL (ref 1.7–7.7)
Neutrophils Relative %: 70 %
Platelets: 271 10*3/uL (ref 150–400)
RBC: 4.21 MIL/uL (ref 3.87–5.11)
RDW: 12.4 % (ref 11.5–15.5)
WBC: 8.7 10*3/uL (ref 4.0–10.5)
nRBC: 0 % (ref 0.0–0.2)

## 2018-12-03 LAB — URINALYSIS, ROUTINE W REFLEX MICROSCOPIC
Bilirubin Urine: NEGATIVE
Glucose, UA: NEGATIVE mg/dL
Hgb urine dipstick: NEGATIVE
Ketones, ur: NEGATIVE mg/dL
Nitrite: POSITIVE — AB
Protein, ur: NEGATIVE mg/dL
Specific Gravity, Urine: 1.01 (ref 1.005–1.030)
pH: 7 (ref 5.0–8.0)

## 2018-12-03 LAB — LIPASE, BLOOD: Lipase: 30 U/L (ref 11–51)

## 2018-12-03 LAB — URINALYSIS, MICROSCOPIC (REFLEX)

## 2018-12-03 MED ORDER — CIPROFLOXACIN HCL 500 MG PO TABS: 500.0000 mg | ORAL_TABLET | Freq: Two times a day (BID) | ORAL | 0 refills | Status: DC

## 2018-12-03 MED ORDER — HYDROCODONE-ACETAMINOPHEN 5-325 MG PO TABS: 1.0000 | ORAL_TABLET | Freq: Four times a day (QID) | ORAL | 0 refills | Status: DC | PRN

## 2018-12-03 MED ORDER — METRONIDAZOLE 500 MG PO TABS: 500.0000 mg | ORAL_TABLET | Freq: Two times a day (BID) | ORAL | 0 refills | Status: DC

## 2018-12-03 MED ORDER — MORPHINE SULFATE (PF) 4 MG/ML IV SOLN
4.0000 mg | Freq: Once | INTRAVENOUS | Status: AC
  Administered 2018-12-03: 4 mg via INTRAVENOUS
  Filled 2018-12-03: qty 1

## 2018-12-03 MED ORDER — FLUCONAZOLE 150 MG PO TABS: 150.0000 mg | ORAL_TABLET | Freq: Once | ORAL | 0 refills | Status: AC

## 2018-12-03 MED ORDER — ONDANSETRON 4 MG PO TBDP: 4.0000 mg | ORAL_TABLET | ORAL | 0 refills | Status: DC | PRN

## 2018-12-03 MED ORDER — AMOXICILLIN-POT CLAVULANATE 875-125 MG PO TABS: 1.0000 | ORAL_TABLET | Freq: Two times a day (BID) | ORAL | 0 refills | Status: DC

## 2018-12-03 MED ORDER — METRONIDAZOLE 500 MG PO TABS
500.0000 mg | ORAL_TABLET | Freq: Once | ORAL | Status: AC
  Administered 2018-12-03: 500 mg via ORAL
  Filled 2018-12-03: qty 1

## 2018-12-03 MED ORDER — CIPROFLOXACIN HCL 500 MG PO TABS
500.0000 mg | ORAL_TABLET | Freq: Once | ORAL | Status: AC
  Administered 2018-12-03: 500 mg via ORAL
  Filled 2018-12-03: qty 1

## 2018-12-03 NOTE — ED Provider Notes (Signed)
[facility] Provider Note   CSN: [id number] Arrival date & time: 12/03/18  0745    History   Chief Complaint Chief Complaint  Patient presents with  . Abdominal Pain    HPI Tamara Charles is a 58 y.o. female.     HPI Patient reports she has had left lower abdominal pain since Sunday, 3 days ago.  He reports she had been sitting outside with a friend and drinking some white claw beverages.  They had been sitting baby pool.  She reports when she got out she noticed some aching discomfort in her left lower abdomen and groin area.  She thought maybe it was due to the carbonation from the white claws.  Reports she did not strain her slip when she was getting out.  She reports since that time she has had increased aching and pain in the left lower abdomen.  She denies she has had any pain or burning with urination.  She has not seen any blood in the urine.  She reports it is painful with movements and made it hard for her to sleep last night.  No vomiting or diarrhea.  Has prior history of diverticulitis.  Was feeling well before symptoms onset.  She reports that last time she had diverticulitis, she had a lot of diarrhea as well which he has not had with this onset of symptoms.  She reports that she also ate a steak which she typically does not eat and she had somewhat blamed it on the steak  causing her GI discomfort. Past Medical History:  Diagnosis Date  . Hypertension   . Spontaneous pneumothorax    at 58 yo    Patient Active Problem List   Diagnosis Date Noted  . Left flank pain 06/26/2018  . Acute bronchitis 06/26/2018  . Lower abdominal pain 06/26/2018  . Diverticulitis 01/28/2018  . Diarrhea 01/28/2018  . Rash and nonspecific skin eruption 10/23/2017  . Hot flash not due to menopause 06/26/2016  . Herpes simplex 04/20/2015  . Weight gain 08/25/2014  . Well adult exam 02/23/2014  . Bleb, lung ([place]) 02/23/2014  . Family history of early CAD  02/23/2014  . Tobacco use disorder 02/23/2014  . Hypertension 04/21/2013  . Spontaneous pneumothorax 04/21/2013    Past Surgical History:  Procedure Laterality Date  . ABDOMINAL HYSTERECTOMY     partial  . BREAST SURGERY Bilateral    breast reduction  . LUNG SURGERY Bilateral 1997  . REDUCTION MAMMAPLASTY       OB History   No obstetric history on file.      Home Medications    Prior to Admission medications   Medication Sig Start Date End Date Taking? Authorizing Provider  amoxicillin-clavulanate (AUGMENTIN) 875-125 MG tablet Take 1 tablet by mouth 2 (two) times daily. 06/26/18   Binnie Rail, MD  aspirin EC 81 MG tablet Take 1 tablet (81 mg total) by mouth daily. 02/23/14   Plotnikov, Evie Lacks, MD  ciprofloxacin (CIPRO) 500 MG tablet Take 1 tablet (500 mg total) by mouth 2 (two) times daily. One po bid x 7 days 12/03/18   Charlesetta Shanks, MD  diphenoxylate-atropine (LOMOTIL) 2.5-0.025 MG tablet Take 1-2 tablets by mouth 4 (four) times daily as needed for diarrhea or loose stools. 01/28/18   Plotnikov, Evie Lacks, MD  HYDROcodone-acetaminophen (NORCO/VICODIN) 5-325 MG tablet Take 1-2 tablets by mouth every 6 (six) hours as needed for moderate pain or severe pain. 12/03/18   Charlesetta Shanks, MD  metroNIDAZOLE (FLAGYL) 500 MG tablet Take 1 tablet (500 mg total) by mouth 2 (two) times daily. One po bid x 7 days 12/03/18   Charlesetta Shanks, MD  naproxen sodium (ANAPROX) 220 MG tablet Take 220 mg by mouth 2 (two) times daily with a meal.    [provider]  nortriptyline (PAMELOR) 10 MG capsule TAKE 1 CAPSULE (10 MG TOTAL) BY MOUTH AT BEDTIME. 02/14/18   Plotnikov, Evie Lacks, MD  ondansetron (ZOFRAN ODT) 4 MG disintegrating tablet Take 1 tablet (4 mg total) by mouth every 4 (four) hours as needed for nausea or vomiting. 12/03/18   Charlesetta Shanks, MD  triamcinolone cream (KENALOG) 0.1 % Apply 1 application topically 2 (two) times daily. 10/09/17   Burchette, Alinda Sierras, MD   triamterene-hydrochlorothiazide (MAXZIDE-25) 37.5-25 MG tablet TAKE 1 TABLET BY MOUTH EVERY DAY 07/17/18   Plotnikov, Evie Lacks, MD  valACYclovir (VALTREX) 500 MG tablet Take 1 tablet (500 mg total) by mouth 2 (two) times daily. 03/29/18   Plotnikov, Evie Lacks, MD  venlafaxine XR (EFFEXOR-XR) 150 MG 24 hr capsule TAKE 1 CAPSULE BY MOUTH DAILY WITH BREAKFAST. **NEED OV FOR FURTHER REFILLS** 07/04/17   Plotnikov, Evie Lacks, MD    Family History Family History  Problem Relation Age of Onset  . Heart disease Mother   . Hypertension Mother   . Heart disease Brother   . Breast cancer Maternal Aunt     Social History Social History   Tobacco Use  . Smoking status: Current Every Day Smoker    Packs/day: 0.50    Types: Cigarettes  . Smokeless tobacco: Never Used  Substance Use Topics  . Alcohol use: Yes    Comment: occ  . Drug use: Yes    Frequency: 7.0 times per week    Types: Marijuana     Allergies   Patient has no known allergies.   Review of Systems Review of Systems 10 Systems reviewed and are negative for acute change except as noted in the HPI.   Physical Exam Updated Vital Signs BP (!) 116/94 (BP Location: Right Arm)   Pulse 80   Temp 98.1 F (36.7 C) (Oral)   Resp 16   Ht 5\' 7"  (1.702 m)   Wt 78.5 kg   LMP 06/05/2008   SpO2 99%   BMI 27.10 kg/m   Physical Exam Constitutional:      Comments: Patient is alert and clinically well in appearance.  Nontoxic.  Normal mental status normal respiratory status  HENT:     Head: Normocephalic and atraumatic.  Eyes:     Extraocular Movements: Extraocular movements intact.  Cardiovascular:     Rate and Rhythm: Normal rate and regular rhythm.  Pulmonary:     Effort: Pulmonary effort is normal.     Breath sounds: Normal breath sounds.  Abdominal:     Comments: Abdomen is soft.  Patient has very localizing pain that is easily reproducible in the left lower quadrant.  No discomfort to palpation suprapubic and left  lateral mid abdomen.  No guarding.  No CVA tenderness.  No mass, fullness or lymphadenopathy in the inguinal region.  Musculoskeletal: Normal range of motion.        General: No swelling.     Right lower leg: No edema.     Left lower leg: No edema.  Skin:    General: Skin is warm and dry.  Neurological:     General: No focal deficit present.     Mental Status: She is  oriented to person, place, and time.     Coordination: Coordination normal.  Psychiatric:        Mood and Affect: Mood normal.      ED Treatments / Results  Labs (all labs ordered are listed, but only abnormal results are displayed) Labs Reviewed  COMPREHENSIVE METABOLIC PANEL - Abnormal; Notable for the following components:      Result Value   Glucose, Bld 121 (*)    All other components within normal limits  URINALYSIS, ROUTINE W REFLEX MICROSCOPIC - Abnormal; Notable for the following components:   Nitrite POSITIVE (*)    Leukocytes,Ua TRACE (*)    All other components within normal limits  URINALYSIS, MICROSCOPIC (REFLEX) - Abnormal; Notable for the following components:   Bacteria, UA MANY (*)    All other components within normal limits  URINE CULTURE  LIPASE, BLOOD  CBC WITH DIFFERENTIAL/PLATELET    EKG None  Radiology No results found.  Procedures Procedures (including critical care time)  Medications Ordered in ED Medications  morphine 4 MG/ML injection 4 mg (4 mg Intravenous Given 12/03/18 0810)  ciprofloxacin (CIPRO) tablet 500 mg (500 mg Oral Given 12/03/18 0849)  metroNIDAZOLE (FLAGYL) tablet 500 mg (500 mg Oral Given 12/03/18 0849)     Initial Impression / Assessment and Plan / ED Course  I have reviewed the triage vital signs and the nursing notes.  Pertinent labs & imaging results that were available during my care of the patient were reviewed by me and considered in my medical decision making (see chart for details).       Patient has had lower abdominal pain that started with  a gradual aching quality and has worsened over 3 days.  Patient has history of diverticulitis.  Clinically she is well in appearance and does have focally reproducible pain in the left lower abdomen.  No leukocytosis or fever.  No guarding.  At this time will start empiric treatment for diverticulitis.  Patient is counseled on return precautions.  She is aware of the spectrum of disease with diverticulitis.  Patient is counseled to return should her pain not be improving should she develop fever vomiting or acutely worsening symptoms.  She is advised on follow-up with PCP within 2 to 3 days.  Final Clinical Impressions(s) / ED Diagnoses   Final diagnoses:  Left lower quadrant abdominal pain  Diverticulitis    ED Discharge Orders         Ordered    ciprofloxacin (CIPRO) 500 MG tablet  2 times daily     12/03/18 0853    metroNIDAZOLE (FLAGYL) 500 MG tablet  2 times daily     12/03/18 0853    HYDROcodone-acetaminophen (NORCO/VICODIN) 5-325 MG tablet  Every 6 hours PRN     12/03/18 0853    ondansetron (ZOFRAN ODT) 4 MG disintegrating tablet  Every 4 hours PRN     12/03/18 0853           Charlesetta Shanks, MD 12/05/18 [phone]

## 2018-12-03 NOTE — ED Triage Notes (Signed)
Pt reports lower left abdominal/groin pain since Sunday, worse today.  Denies burning with urination, does report difficulty starting urine stream.

## 2018-12-03 NOTE — Discharge Instructions (Addendum)
At this time it is very likely that you have diverticulitis.  Start ciprofloxacin and Flagyl as prescribed.  Take Vicodin 1 to 2 tablets every 6 hours if needed for pain control.  Return to the emergency department immediately if you develop a fever, pain is suddenly worsening, you develop vomiting, cannot take your medications or other concerning symptoms develop. If your symptoms are improving, schedule a follow-up appointment with your doctor for recheck within the next 2 to 3 days. Urine culture was done.  Results should be available in approximately 2 days to determine if you have a urinary tract infection.

## 2018-12-05 ENCOUNTER — Ambulatory Visit (INDEPENDENT_AMBULATORY_CARE_PROVIDER_SITE_OTHER): Payer: Self-pay | Admitting: Internal Medicine

## 2018-12-05 ENCOUNTER — Encounter: Payer: Self-pay | Admitting: Internal Medicine

## 2018-12-05 ENCOUNTER — Other Ambulatory Visit: Payer: Self-pay

## 2018-12-05 DIAGNOSIS — L568 Other specified acute skin changes due to ultraviolet radiation: Secondary | ICD-10-CM

## 2018-12-05 DIAGNOSIS — K5792 Diverticulitis of intestine, part unspecified, without perforation or abscess without bleeding: Secondary | ICD-10-CM

## 2018-12-05 DIAGNOSIS — R1030 Lower abdominal pain, unspecified: Secondary | ICD-10-CM

## 2018-12-05 LAB — URINE CULTURE: Culture: >=100000 — AB

## 2018-12-05 MED ORDER — TRIAMCINOLONE ACETONIDE 0.1 % EX CREA: 1.0000 "application " | TOPICAL_CREAM | Freq: Two times a day (BID) | CUTANEOUS | 3 refills | Status: DC

## 2018-12-05 MED ORDER — METHYLPREDNISOLONE ACETATE 80 MG/ML IJ SUSP
80.0000 mg | Freq: Once | INTRAMUSCULAR | Status: AC
  Administered 2018-12-05: 16:00:00 80 mg via INTRAMUSCULAR

## 2018-12-05 MED ORDER — FLUCONAZOLE 150 MG PO TABS: 150.0000 mg | ORAL_TABLET | Freq: Once | ORAL | 1 refills | Status: AC

## 2018-12-05 NOTE — Assessment & Plan Note (Signed)
D/c Cipro Depomedrol IM Triamc cream Claritin

## 2018-12-05 NOTE — Progress Notes (Signed)
Subjective:  Patient ID: Tamara Charles, female    DOB: 1961/03/26  Age: 58 y.o. MRN: [id number]  CC: No chief complaint on file.   HPI Tamara Charles presents for diverticulitis sx's x 4-5 days. Pt went to ER on 6/30 - started on PO abx - she started Cipro, Augmentin, Flagyl on Tue. Per note (Cipro and Flagyl were given) C/o nausea. Pt developed rash yesterday on sun exposed areas only. She was inside her home over past two days... LLQ abd pain is better  Outpatient Medications Prior to Visit  Medication Sig Dispense Refill  . amoxicillin-clavulanate (AUGMENTIN) 875-125 MG tablet Take 1 tablet by mouth 2 (two) times daily. 20 tablet 0  . amoxicillin-clavulanate (AUGMENTIN) 875-125 MG tablet Take 1 tablet by mouth 2 (two) times daily. One po bid x 7 days 14 tablet 0  . aspirin EC 81 MG tablet Take 1 tablet (81 mg total) by mouth daily. 100 tablet 3  . diphenoxylate-atropine (LOMOTIL) 2.5-0.025 MG tablet Take 1-2 tablets by mouth 4 (four) times daily as needed for diarrhea or loose stools. 60 tablet 0  . HYDROcodone-acetaminophen (NORCO/VICODIN) 5-325 MG tablet Take 1-2 tablets by mouth every 6 (six) hours as needed for moderate pain or severe pain. 20 tablet 0  . metroNIDAZOLE (FLAGYL) 500 MG tablet Take 1 tablet (500 mg total) by mouth 2 (two) times daily. One po bid x 7 days 14 tablet 0  . naproxen sodium (ANAPROX) 220 MG tablet Take 220 mg by mouth 2 (two) times daily with a meal.    . nortriptyline (PAMELOR) 10 MG capsule TAKE 1 CAPSULE (10 MG TOTAL) BY MOUTH AT BEDTIME. 90 capsule 2  . ondansetron (ZOFRAN ODT) 4 MG disintegrating tablet Take 1 tablet (4 mg total) by mouth every 4 (four) hours as needed for nausea or vomiting. 20 tablet 0  . triamcinolone cream (KENALOG) 0.1 % Apply 1 application topically 2 (two) times daily. 30 g 0  . triamterene-hydrochlorothiazide (MAXZIDE-25) 37.5-25 MG tablet TAKE 1 TABLET BY MOUTH EVERY DAY 90 tablet 3  . valACYclovir (VALTREX) 500 MG tablet  Take 1 tablet (500 mg total) by mouth 2 (two) times daily. 20 tablet 3  . venlafaxine XR (EFFEXOR-XR) 150 MG 24 hr capsule TAKE 1 CAPSULE BY MOUTH DAILY WITH BREAKFAST. **NEED OV FOR FURTHER REFILLS** 90 capsule 0   No facility-administered medications prior to visit.     ROS: Review of Systems  Constitutional: Negative for activity change, appetite change, chills, fatigue and unexpected weight change.  HENT: Negative for congestion, mouth sores and sinus pressure.   Eyes: Negative for visual disturbance.  Respiratory: Negative for cough and chest tightness.   Gastrointestinal: Positive for abdominal pain and constipation. Negative for diarrhea and nausea.  Genitourinary: Negative for difficulty urinating, frequency and vaginal pain.  Musculoskeletal: Negative for back pain and gait problem.  Skin: Positive for rash. Negative for pallor.  Neurological: Negative for dizziness, tremors, weakness, numbness and headaches.  Psychiatric/Behavioral: Negative for confusion and sleep disturbance.    Objective:  BP 130/82 (BP Location: Left Arm, Patient Position: Sitting, Cuff Size: Large)   Pulse 87   Temp 98.4 F (36.9 C) (Oral)   Ht 5\' 7"  (1.702 m)   Wt 172 lb (78 kg)   LMP 06/05/2008   SpO2 97%   BMI 26.94 kg/m   BP Readings from Last 3 Encounters:  12/05/18 130/82  12/03/18 124/70  06/26/18 138/82    Wt Readings from Last 3 Encounters:  12/05/18 172 lb (  78 kg)  12/03/18 173 lb (78.5 kg)  06/26/18 169 lb (76.7 kg)    Physical Exam Constitutional:      General: She is not in acute distress.    Appearance: She is well-developed.  HENT:     Head: Normocephalic.     Right Ear: External ear normal.     Left Ear: External ear normal.     Nose: Nose normal.  Eyes:     General:        Right eye: No discharge.        Left eye: No discharge.     Conjunctiva/sclera: Conjunctivae normal.     Pupils: Pupils are equal, round, and reactive to light.  Neck:     Musculoskeletal:  Normal range of motion and neck supple.     Thyroid: No thyromegaly.     Vascular: No JVD.     Trachea: No tracheal deviation.  Cardiovascular:     Rate and Rhythm: Normal rate and regular rhythm.     Heart sounds: Normal heart sounds.  Pulmonary:     Effort: No respiratory distress.     Breath sounds: No stridor. No wheezing.  Abdominal:     General: Bowel sounds are normal. There is no distension.     Palpations: Abdomen is soft. There is no mass.     Tenderness: There is abdominal tenderness. There is no guarding or rebound.  Musculoskeletal:        General: No tenderness.  Lymphadenopathy:     Cervical: No cervical adenopathy.  Skin:    Findings: Erythema and rash present.  Neurological:     Cranial Nerves: No cranial nerve deficit.     Motor: No abnormal muscle tone.     Coordination: Coordination normal.     Deep Tendon Reflexes: Reflexes normal.  Psychiatric:        Behavior: Behavior normal.        Thought Content: Thought content normal.        Judgment: Judgment normal.   eryth papular rash on sun exposed areas only - chest, extr x4 LLQ is sensitive  Lab Results  Component Value Date   WBC 8.7 12/03/2018   HGB 13.8 12/03/2018   HCT 41.2 12/03/2018   PLT 271 12/03/2018   GLUCOSE 121 (H) 12/03/2018   CHOL 191 01/28/2018   TRIG 111.0 01/28/2018   HDL 43.70 01/28/2018   LDLDIRECT 146.0 08/25/2014   LDLCALC 125 (H) 01/28/2018   ALT 11 12/03/2018   AST 17 12/03/2018   NA 138 12/03/2018   K 3.6 12/03/2018   CL 102 12/03/2018   CREATININE 0.63 12/03/2018   BUN 15 12/03/2018   CO2 25 12/03/2018   TSH 1.13 01/28/2018    No results found.  Assessment & Plan:   There are no diagnoses linked to this encounter.   No orders of the defined types were placed in this encounter.    Follow-up: No follow-ups on file.  Walker Kehr, MD

## 2018-12-05 NOTE — Assessment & Plan Note (Signed)
Cont Augmentin and Flagyl D/c Cipro

## 2018-12-05 NOTE — Assessment & Plan Note (Signed)
Better - finish abx (Flagyl and Augmentin)

## 2018-12-05 NOTE — Patient Instructions (Addendum)
Stop Cipro Continue Flagyl and Amoxicillin Clavulanate Triamc cream Claritin

## 2018-12-06 ENCOUNTER — Telehealth: Payer: Self-pay

## 2018-12-06 NOTE — Telephone Encounter (Signed)
Post ED Visit - Positive Culture Follow-up  Culture report reviewed by antimicrobial stewardship pharmacist: [place] [facility] []  Elenor Quinones, Pharm.D. []  Heide Guile, Pharm.D., BCPS AQ-ID []  Parks Neptune, Pharm.D., BCPS []  Alycia Rossetti, Pharm.D., BCPS []  [place], Pharm.D., BCPS, AAHIVP []  Legrand Como, Pharm.D., BCPS, AAHIVP []  Salome Arnt, PharmD, BCPS []  Johnnette Gourd, PharmD, BCPS []  Hughes Better, PharmD, BCPS []  Leeroy Cha, PharmD []  Laqueta Linden, PharmD, BCPS []  Albertina Parr, PharmD [place] Team []  Leodis Sias, PharmD []  Lindell Spar, PharmD []  Royetta Asal, PharmD []  Graylin Shiver, Rph []  Rema Fendt) Glennon Mac, PharmD []  Arlyn Dunning, PharmD []  Netta Cedars, PharmD []  Dia Sitter, PharmD []  Leone Haven, PharmD []  Gretta Arab, PharmD []  Theodis Shove, PharmD []  Peggyann Juba, PharmD []  Reuel Boom, PharmD   Positive urine culture Treated with Cipro, organism sensitive to the same and no further patient follow-up is required at this time.  Genia Del 12/06/2018, 8:15 AM

## 2018-12-31 MED ORDER — AMOXICILLIN 500 MG PO CAPS: 500.0000 mg | ORAL_CAPSULE | Freq: Three times a day (TID) | ORAL | 0 refills | Status: DC

## 2018-12-31 NOTE — Telephone Encounter (Signed)
C/o UTI sx's Will email Amox Rx

## 2019-01-02 ENCOUNTER — Ambulatory Visit (INDEPENDENT_AMBULATORY_CARE_PROVIDER_SITE_OTHER): Payer: Self-pay | Admitting: Internal Medicine

## 2019-01-02 ENCOUNTER — Encounter: Payer: Self-pay | Admitting: Internal Medicine

## 2019-01-02 ENCOUNTER — Other Ambulatory Visit: Payer: Self-pay

## 2019-01-02 DIAGNOSIS — R110 Nausea: Secondary | ICD-10-CM | POA: Insufficient documentation

## 2019-01-02 DIAGNOSIS — K5792 Diverticulitis of intestine, part unspecified, without perforation or abscess without bleeding: Secondary | ICD-10-CM

## 2019-01-02 MED ORDER — CLINDAMYCIN HCL 300 MG PO CAPS: 300.0000 mg | ORAL_CAPSULE | Freq: Three times a day (TID) | ORAL | 0 refills | Status: DC

## 2019-01-02 MED ORDER — HYDROCODONE-ACETAMINOPHEN 5-325 MG PO TABS: 0.5000 | ORAL_TABLET | Freq: Four times a day (QID) | ORAL | 0 refills | Status: DC | PRN

## 2019-01-02 MED ORDER — CEFUROXIME AXETIL 500 MG PO TABS: 500.0000 mg | ORAL_TABLET | Freq: Two times a day (BID) | ORAL | 0 refills | Status: DC

## 2019-01-02 MED ORDER — ONDANSETRON HCL 4 MG PO TABS: 4.0000 mg | ORAL_TABLET | Freq: Three times a day (TID) | ORAL | 0 refills | Status: DC | PRN

## 2019-01-02 MED ORDER — ALIGN 4 MG PO CAPS: 1.0000 | ORAL_CAPSULE | Freq: Every day | ORAL | 1 refills | Status: AC

## 2019-01-02 MED ORDER — POLYETHYLENE GLYCOL 3350 17 GM/SCOOP PO POWD: 17.0000 g | Freq: Two times a day (BID) | ORAL | 5 refills | Status: DC | PRN

## 2019-01-02 NOTE — Progress Notes (Signed)
Subjective:  Patient ID: Tamara Charles, female    DOB: 1961/02/09  Age: 58 y.o. MRN: [id number]  CC: Abdominal Pain (LLQ x 4 days)   HPI Tamara Charles presents for a diverticulitis sx's came back - pain came back in the LLQ - worse this am; nauseated.  The pain is severe at times.  Tamara Charles had skin rash due to antibiotics and sun exposure recent rash-see allergist  Outpatient Medications Prior to Visit  Medication Sig Dispense Refill  . amoxicillin (AMOXIL) 500 MG capsule Take 1 capsule (500 mg total) by mouth 3 (three) times daily. 15 capsule 0  . aspirin EC 81 MG tablet Take 1 tablet (81 mg total) by mouth daily. 100 tablet 3  . diphenoxylate-atropine (LOMOTIL) 2.5-0.025 MG tablet Take 1-2 tablets by mouth 4 (four) times daily as needed for diarrhea or loose stools. 60 tablet 0  . HYDROcodone-acetaminophen (NORCO/VICODIN) 5-325 MG tablet Take 1-2 tablets by mouth every 6 (six) hours as needed for moderate pain or severe pain. 20 tablet 0  . metroNIDAZOLE (FLAGYL) 500 MG tablet Take 1 tablet (500 mg total) by mouth 2 (two) times daily. One po bid x 7 days 14 tablet 0  . naproxen sodium (ANAPROX) 220 MG tablet Take 220 mg by mouth 2 (two) times daily with a meal.    . nortriptyline (PAMELOR) 10 MG capsule TAKE 1 CAPSULE (10 MG TOTAL) BY MOUTH AT BEDTIME. 90 capsule 2  . ondansetron (ZOFRAN ODT) 4 MG disintegrating tablet Take 1 tablet (4 mg total) by mouth every 4 (four) hours as needed for nausea or vomiting. 20 tablet 0  . triamcinolone cream (KENALOG) 0.1 % Apply 1 application topically 2 (two) times daily. 80 g 3  . triamterene-hydrochlorothiazide (MAXZIDE-25) 37.5-25 MG tablet TAKE 1 TABLET BY MOUTH EVERY DAY 90 tablet 3  . valACYclovir (VALTREX) 500 MG tablet Take 1 tablet (500 mg total) by mouth 2 (two) times daily. 20 tablet 3  . venlafaxine XR (EFFEXOR-XR) 150 MG 24 hr capsule TAKE 1 CAPSULE BY MOUTH DAILY WITH BREAKFAST. **NEED OV FOR FURTHER REFILLS** 90 capsule 0   No  facility-administered medications prior to visit.     ROS: Review of Systems  Constitutional: Positive for chills and fatigue. Negative for activity change, appetite change and unexpected weight change.  HENT: Negative for congestion, mouth sores and sinus pressure.   Eyes: Negative for visual disturbance.  Respiratory: Negative for cough and chest tightness.   Gastrointestinal: Positive for abdominal pain, constipation and nausea. Negative for vomiting.  Genitourinary: Negative for difficulty urinating, frequency and vaginal pain.  Musculoskeletal: Negative for back pain and gait problem.  Skin: Negative for pallor and rash.  Neurological: Negative for dizziness, tremors, weakness, numbness and headaches.  Psychiatric/Behavioral: Negative for confusion and sleep disturbance.    Objective:  BP 120/72 (BP Location: Left Arm, Patient Position: Sitting, Cuff Size: Normal)   Pulse 78   Temp 97.7 F (36.5 C) (Oral)   Ht 5\' 7"  (1.702 m)   Wt 171 lb (77.6 kg)   LMP 06/05/2008   SpO2 98%   BMI 26.78 kg/m   BP Readings from Last 3 Encounters:  01/02/19 120/72  12/05/18 130/82  12/03/18 124/70    Wt Readings from Last 3 Encounters:  01/02/19 171 lb (77.6 kg)  12/05/18 172 lb (78 kg)  12/03/18 173 lb (78.5 kg)    Physical Exam Constitutional:      General: She is not in acute distress.    Appearance: She is  well-developed.  HENT:     Head: Normocephalic.     Right Ear: External ear normal.     Left Ear: External ear normal.     Nose: Nose normal.  Eyes:     General:        Right eye: No discharge.        Left eye: No discharge.     Conjunctiva/sclera: Conjunctivae normal.     Pupils: Pupils are equal, round, and reactive to light.  Neck:     Musculoskeletal: Normal range of motion and neck supple.     Thyroid: No thyromegaly.     Vascular: No JVD.     Trachea: No tracheal deviation.  Cardiovascular:     Rate and Rhythm: Normal rate and regular rhythm.     Heart  sounds: Normal heart sounds.  Pulmonary:     Effort: No respiratory distress.     Breath sounds: No stridor. No wheezing.  Abdominal:     General: Bowel sounds are normal. There is no distension.     Palpations: Abdomen is soft. There is no mass.     Tenderness: There is abdominal tenderness in the left lower quadrant. There is no guarding or rebound.     Hernia: No hernia is present.  Musculoskeletal:        General: No tenderness.  Lymphadenopathy:     Cervical: No cervical adenopathy.  Skin:    Findings: No erythema or rash.  Neurological:     Cranial Nerves: No cranial nerve deficit.     Motor: No abnormal muscle tone.     Coordination: Coordination normal.     Deep Tendon Reflexes: Reflexes normal.  Psychiatric:        Behavior: Behavior normal.        Thought Content: Thought content normal.        Judgment: Judgment normal.     Lab Results  Component Value Date   WBC 8.7 12/03/2018   HGB 13.8 12/03/2018   HCT 41.2 12/03/2018   PLT 271 12/03/2018   GLUCOSE 121 (H) 12/03/2018   CHOL 191 01/28/2018   TRIG 111.0 01/28/2018   HDL 43.70 01/28/2018   LDLDIRECT 146.0 08/25/2014   LDLCALC 125 (H) 01/28/2018   ALT 11 12/03/2018   AST 17 12/03/2018   NA 138 12/03/2018   K 3.6 12/03/2018   CL 102 12/03/2018   CREATININE 0.63 12/03/2018   BUN 15 12/03/2018   CO2 25 12/03/2018   TSH 1.13 01/28/2018    No results found.  Assessment & Plan:   There are no diagnoses linked to this encounter.   No orders of the defined types were placed in this encounter.    Follow-up: No follow-ups on file.  Walker Kehr, MD

## 2019-01-02 NOTE — Assessment & Plan Note (Addendum)
IMPRESSION: 1. Improved appearance of acute diverticulitis of the LOWER descending colon. 2. Persistent significant colonic diverticular disease without new inflammatory change. 3. There is no abscess or evidence for perforation. 4. Normal appendix. Electronically Signed   By: Nolon Nations M.D.   On: 02/04/2018 12:34 Recurrent diverticulitis Probiotic Diet info -low residue diet for 2 weeks GI referral offered Ceftin, Clinda po Go to ER if worse RTC 2 wks

## 2019-01-02 NOTE — Assessment & Plan Note (Signed)
Zofran prn

## 2019-01-02 NOTE — Patient Instructions (Signed)
Diverticulitis  Diverticulitis is when small pockets in your large intestine (colon) get infected or swollen. This causes stomach pain and watery poop (diarrhea). These pouches are called diverticula. They form in people who have a condition called diverticulosis. Follow these instructions at home: Medicines  Take over-the-counter and prescription medicines only as told by your doctor. These include: ? Antibiotics. ? Pain medicines. ? Fiber pills. ? Probiotics. ? Stool softeners.  Do not drive or use heavy machinery while taking prescription pain medicine.  If you were prescribed an antibiotic, take it as told. Do not stop taking it even if you feel better. General instructions   Follow a diet as told by your doctor.  When you feel better, your doctor may tell you to change your diet. You may need to eat a lot of fiber. Fiber makes it easier to poop (have bowel movements). Healthy foods with fiber include: ? Berries. ? Beans. ? Lentils. ? Green vegetables.  Exercise 3 or more times a week. Aim for 30 minutes each time. Exercise enough to sweat and make your heart beat faster.  Keep all follow-up visits as told. This is important. You may need to have an exam of the large intestine. This is called a colonoscopy. Contact a doctor if:  Your pain does not get better.  You have a hard time eating or drinking.  You are not pooping like normal. Get help right away if:  Your pain gets worse.  Your problems do not get better.  Your problems get worse very fast.  You have a fever.  You throw up (vomit) more than one time.  You have poop that is: ? Bloody. ? Black. ? Tarry. Summary  Diverticulitis is when small pockets in your large intestine (colon) get infected or swollen.  Take medicines only as told by your doctor.  Follow a diet as told by your doctor. This information is not intended to replace advice given to you by your health care provider. Make sure you  discuss any questions you have with your health care provider. Document Released: 11/08/2007 Document Revised: 05/04/2017 Document Reviewed: 06/08/2016 Elsevier Patient Education  2020 [employer]. Diverticulosis  Diverticulosis is a condition that develops when small pouches (diverticula) form in the wall of the large intestine (colon). The colon is where water is absorbed and stool is formed. The pouches form when the inside layer of the colon pushes through weak spots in the outer layers of the colon. You may have a few pouches or many of them. What are the causes? The cause of this condition is not known. What increases the risk? The following factors may make you more likely to develop this condition:  Being older than age 62. Your risk for this condition increases with age. Diverticulosis is rare among people younger than age 41. By age 56, many people have it.  Eating a low-fiber diet.  Having frequent constipation.  Being overweight.  Not getting enough exercise.  Smoking.  Taking over-the-counter pain medicines, like aspirin and ibuprofen.  Having a family history of diverticulosis. What are the signs or symptoms? In most people, there are no symptoms of this condition. If you do have symptoms, they may include:  Bloating.  Cramps in the abdomen.  Constipation or diarrhea.  Pain in the lower left side of the abdomen. How is this diagnosed? This condition is most often diagnosed during an exam for other colon problems. Because diverticulosis usually has no symptoms, it often cannot be diagnosed  independently. This condition may be diagnosed by:  Using a flexible scope to examine the colon (colonoscopy).  Taking an X-ray of the colon after dye has been put into the colon (barium enema).  Doing a CT scan. How is this treated? You may not need treatment for this condition if you have never developed an infection related to diverticulosis. If you have had an  infection before, treatment may include:  Eating a high-fiber diet. This may include eating more fruits, vegetables, and grains.  Taking a fiber supplement.  Taking a live bacteria supplement (probiotic).  Taking medicine to relax your colon.  Taking antibiotic medicines. Follow these instructions at home:  Drink 6-8 glasses of water or more each day to prevent constipation.  Try not to strain when you have a bowel movement.  If you have had an infection before: ? Eat more fiber as directed by your health care provider or your diet and nutrition specialist (dietitian). ? Take a fiber supplement or probiotic, if your health care provider approves.  Take over-the-counter and prescription medicines only as told by your health care provider.  If you were prescribed an antibiotic, take it as told by your health care provider. Do not stop taking the antibiotic even if you start to feel better.  Keep all follow-up visits as told by your health care provider. This is important. Contact a health care provider if:  You have pain in your abdomen.  You have bloating.  You have cramps.  You have not had a bowel movement in 3 days. Get help right away if:  Your pain gets worse.  Your bloating becomes very bad.  You have a fever or chills, and your symptoms suddenly get worse.  You vomit.  You have bowel movements that are bloody or black.  You have bleeding from your rectum. Summary  Diverticulosis is a condition that develops when small pouches (diverticula) form in the wall of the large intestine (colon).  You may have a few pouches or many of them.  This condition is most often diagnosed during an exam for other colon problems.  If you have had an infection related to diverticulosis, treatment may include increasing the fiber in your diet, taking supplements, or taking medicines. This information is not intended to replace advice given to you by your health care  provider. Make sure you discuss any questions you have with your health care provider. Document Released: 02/17/2004 Document Revised: 05/04/2017 Document Reviewed: 04/10/2016 Elsevier Patient Education  2020 [employer].

## 2019-01-04 ENCOUNTER — Other Ambulatory Visit: Payer: Self-pay | Admitting: Internal Medicine

## 2019-01-22 ENCOUNTER — Telehealth: Payer: Self-pay

## 2019-01-22 DIAGNOSIS — K5792 Diverticulitis of intestine, part unspecified, without perforation or abscess without bleeding: Secondary | ICD-10-CM

## 2019-01-22 NOTE — Addendum Note (Signed)
Addended by: Cassandria Anger on: 01/22/2019 09:55 PM   Modules accepted: Orders

## 2019-01-22 NOTE — Telephone Encounter (Signed)
Will do. Thank you

## 2019-01-22 NOTE — Telephone Encounter (Signed)
Copied from [place] [id number]. Topic: Referral - Request for Referral >> Jan 22, 2019 11:51 AM Sheran Luz wrote: Has patient seen PCP for this complaint? Yes *If NO, is insurance requiring patient see PCP for this issue before PCP can refer them? Referral for which specialty: Gertie Fey Preferred provider/office: [facility]  Reason for referral: diverticulitis

## 2019-01-23 NOTE — Telephone Encounter (Signed)
Patient informed referral has been placed and number given to call

## 2019-01-28 ENCOUNTER — Encounter: Payer: Self-pay | Admitting: Gastroenterology

## 2019-02-26 ENCOUNTER — Ambulatory Visit (INDEPENDENT_AMBULATORY_CARE_PROVIDER_SITE_OTHER): Payer: Self-pay | Admitting: Gastroenterology

## 2019-02-26 ENCOUNTER — Encounter: Payer: Self-pay | Admitting: Gastroenterology

## 2019-02-26 VITALS — BP 126/76 | HR 74 | Temp 98.0°F | Ht 67.0 in | Wt 176.0 lb

## 2019-02-26 DIAGNOSIS — Z1211 Encounter for screening for malignant neoplasm of colon: Secondary | ICD-10-CM

## 2019-02-26 MED ORDER — PEG 3350-KCL-NA BICARB-NACL 420 G PO SOLR: 4000.0000 mL | ORAL | 0 refills | Status: DC

## 2019-02-26 NOTE — Progress Notes (Signed)
HPI: This is a very pleasant 58 year old woman who was referred to me by Plotnikov, Evie Lacks, MD  to evaluate recurrent diverticulitis.    Chief complaint is recurrent diverticulitis  In 2019 she had CT scan confirmed left colon diverticulitis.  This seemed to respond to typical Cipro and Flagyl antibiotics.  She had a repeat event clinically June 2020, presented to the emergency room for this.  Imaging was not performed.  She was given Cipro and Flagyl again and improved quickly.  Between these episodes she tends to have some discomfort in her left lower quadrant.  She moves her bowels well without constipation as long she takes fiber supplement Metamucil every other day.  She has no overt bleeding, her weight has been overall stable.  Colon cancer does not run in her family  Old Data Reviewed:  CT scan abdomen pelvis with IV and oral contrast August 2019 showed "acute diverticulitis in the descending colon.  No abscess or perforation." CT scan abdomen pelvis with IV and oral contrast September 2019 showed "improved appearance of acute diverticulitis in the lower descending colon.  Persistent significant colonic diverticular disease without new inflammatory change.  There is no abscess or evidence of perforation."     Review of systems: Pertinent positive and negative review of systems were noted in the above HPI section. All other review negative.   Past Medical History:  Diagnosis Date  . Anxiety   . Hypertension   . Spontaneous pneumothorax    at 58 yo  . UTI (urinary tract infection)     Past Surgical History:  Procedure Laterality Date  . ABDOMINAL HYSTERECTOMY     partial  . BREAST SURGERY Bilateral    breast reduction  . LUNG SURGERY Bilateral 1997  . REDUCTION MAMMAPLASTY      Current Outpatient Medications  Medication Sig Dispense Refill  . aspirin EC 81 MG tablet Take 1 tablet (81 mg total) by mouth daily. 100 tablet 3  . calcium carbonate (ANTACID) 500 MG  chewable tablet Chew 1 tablet by mouth 3 (three) times daily.    . naproxen sodium (ANAPROX) 220 MG tablet Take 220 mg by mouth 2 (two) times daily with a meal.    . nortriptyline (PAMELOR) 10 MG capsule TAKE 1 CAPSULE (10 MG TOTAL) BY MOUTH AT BEDTIME. 90 capsule 2  . Probiotic Product (ALIGN) 4 MG CAPS Take 1 capsule (4 mg total) by mouth daily. 30 capsule 1  . psyllium (METAMUCIL) 58.6 % packet Take 1 packet by mouth every other day.    . triamterene-hydrochlorothiazide (MAXZIDE-25) 37.5-25 MG tablet TAKE 1 TABLET BY MOUTH EVERY DAY 90 tablet 3  . valACYclovir (VALTREX) 500 MG tablet Take 1 tablet (500 mg total) by mouth 2 (two) times daily. 20 tablet 3   No current facility-administered medications for this visit.     Allergies as of 02/26/2019 - Review Complete 02/26/2019  Allergen Reaction Noted  . Augmentin [amoxicillin-pot clavulanate]  12/05/2018  . Ciprofloxacin  12/05/2018  . Flagyl [metronidazole]  12/05/2018    Family History  Problem Relation Age of Onset  . Heart disease Mother   . Hypertension Mother   . Heart disease Brother   . Breast cancer Maternal Aunt     Social History   Socioeconomic History  . Marital status: Married    Spouse name: Not on file  . Number of children: Not on file  . Years of education: Not on file  . Highest education level: Not on  file  Occupational History  . Not on file  Social Needs  . Financial resource strain: Not on file  . Food insecurity    Worry: Not on file    Inability: Not on file  . Transportation needs    Medical: Not on file    Non-medical: Not on file  Tobacco Use  . Smoking status: Current Every Day Smoker    Packs/day: 0.50    Types: Cigarettes  . Smokeless tobacco: Never Used  Substance and Sexual Activity  . Alcohol use: Yes    Comment: occ  . Drug use: Yes    Frequency: 7.0 times per week    Types: Marijuana    Comment: daily   . Sexual activity: Yes  Lifestyle  . Physical activity    Days per  week: Not on file    Minutes per session: Not on file  . Stress: Not on file  Relationships  . Social Herbalist on phone: Not on file    Gets together: Not on file    Attends religious service: Not on file    Active member of club or organization: Not on file    Attends meetings of clubs or organizations: Not on file    Relationship status: Not on file  . Intimate partner violence    Fear of current or ex partner: Not on file    Emotionally abused: Not on file    Physically abused: Not on file    Forced sexual activity: Not on file  Other Topics Concern  . Not on file  Social History Narrative  . Not on file     Physical Exam: BP 126/76   Pulse 74   Temp 98 F (36.7 C)   Ht 5\' 7"  (1.702 m)   Wt 176 lb (79.8 kg)   LMP 06/05/2008   BMI 27.57 kg/m  Constitutional: generally well-appearing Psychiatric: alert and oriented x3 Eyes: extraocular movements intact Mouth: oral pharynx moist, no lesions Neck: supple no lymphadenopathy Cardiovascular: heart regular rate and rhythm Lungs: clear to auscultation bilaterally Abdomen: soft, nontender, nondistended, no obvious ascites, no peritoneal signs, normal bowel sounds Extremities: no lower extremity edema bilaterally Skin: no lesions on visible extremities   Assessment and plan: 58 y.o. female with recurrent left-sided diverticulitis  She has had 2 or 3 episodes now, 1 of which was confirmed by CT scan.  I do think she is been having recurrent episodes of diverticulitis clinically.  I recommended that if she has pains again that are suspicious to her repeat diverticulitis she needs to call this office.  I would possibly just call in antibiotics versus work-up again to prove recurrence episodes with a CT scan and possibly lab work.  She has not had a colonoscopy in at least 10 or 15 years, her last one was in [place] and she believes it was normal except for diverticulosis.  I recommended getting her up-to-date on  colon cancer screening with a colonoscopy at her soonest convenience.  Lastly we went over the fact that seeds and nuts in her diet play no role in acute diverticulitis.  She is free to eat seeds and nuts and berries.  Please see the "Patient Instructions" section for addition details about the plan.   Owens Loffler, MD [facility] 02/26/2019, 10:23 AM  Cc: Cassandria Anger, MD

## 2019-02-26 NOTE — Patient Instructions (Signed)
You have been scheduled for a colonoscopy. Please follow written instructions given to you at your visit today.  Please pick up your prep supplies at the pharmacy within the next 1-3 days. If you use inhalers (even only as needed), please bring them with you on the day of your procedure. Your physician has requested that you go to www.startemmi.com and enter the access code given to you at your visit today. This web site gives a general overview about your procedure. However, you should still follow specific instructions given to you by our office regarding your preparation for the procedure.  Thank you for entrusting me with your care and choosing [facility].  Dr Jacobs  

## 2019-03-07 ENCOUNTER — Encounter: Payer: Medicaid Other | Admitting: Gastroenterology

## 2019-04-11 ENCOUNTER — Telehealth: Payer: Self-pay | Admitting: Gastroenterology

## 2019-04-11 ENCOUNTER — Encounter: Payer: Medicaid Other | Admitting: Gastroenterology

## 2019-04-19 ENCOUNTER — Other Ambulatory Visit: Payer: Self-pay

## 2019-04-19 ENCOUNTER — Encounter (HOSPITAL_BASED_OUTPATIENT_CLINIC_OR_DEPARTMENT_OTHER): Payer: Self-pay | Admitting: Emergency Medicine

## 2019-04-19 ENCOUNTER — Emergency Department (HOSPITAL_BASED_OUTPATIENT_CLINIC_OR_DEPARTMENT_OTHER)
Admission: EM | Admit: 2019-04-19 | Discharge: 2019-04-19 | Disposition: A | Discharge status: Home / Self Care | Payer: Medicaid Other | Attending: Emergency Medicine | Admitting: Emergency Medicine

## 2019-04-19 DIAGNOSIS — N12 Tubulo-interstitial nephritis, not specified as acute or chronic: Secondary | ICD-10-CM | POA: Insufficient documentation

## 2019-04-19 DIAGNOSIS — I10 Essential (primary) hypertension: Secondary | ICD-10-CM | POA: Insufficient documentation

## 2019-04-19 DIAGNOSIS — F1210 Cannabis abuse, uncomplicated: Secondary | ICD-10-CM | POA: Insufficient documentation

## 2019-04-19 DIAGNOSIS — F17210 Nicotine dependence, cigarettes, uncomplicated: Secondary | ICD-10-CM | POA: Insufficient documentation

## 2019-04-19 DIAGNOSIS — R110 Nausea: Secondary | ICD-10-CM | POA: Insufficient documentation

## 2019-04-19 DIAGNOSIS — R82998 Other abnormal findings in urine: Secondary | ICD-10-CM | POA: Insufficient documentation

## 2019-04-19 DIAGNOSIS — Z7982 Long term (current) use of aspirin: Secondary | ICD-10-CM | POA: Insufficient documentation

## 2019-04-19 DIAGNOSIS — Z79899 Other long term (current) drug therapy: Secondary | ICD-10-CM | POA: Insufficient documentation

## 2019-04-19 DIAGNOSIS — R300 Dysuria: Secondary | ICD-10-CM | POA: Insufficient documentation

## 2019-04-19 LAB — URINALYSIS, ROUTINE W REFLEX MICROSCOPIC
Bilirubin Urine: NEGATIVE
Glucose, UA: NEGATIVE mg/dL
Hgb urine dipstick: NEGATIVE
Ketones, ur: NEGATIVE mg/dL
Nitrite: NEGATIVE
Protein, ur: NEGATIVE mg/dL
Specific Gravity, Urine: 1.015 (ref 1.005–1.030)
pH: 8 (ref 5.0–8.0)

## 2019-04-19 LAB — URINALYSIS, MICROSCOPIC (REFLEX)

## 2019-04-19 MED ORDER — ONDANSETRON 4 MG PO TBDP: 4.0000 mg | ORAL_TABLET | Freq: Three times a day (TID) | ORAL | 0 refills | Status: DC | PRN

## 2019-04-19 MED ORDER — CEPHALEXIN 500 MG PO CAPS: 500.0000 mg | ORAL_CAPSULE | Freq: Three times a day (TID) | ORAL | 0 refills | Status: DC

## 2019-04-19 NOTE — ED Triage Notes (Signed)
L flank pain x 2 days with dysuria.

## 2019-04-19 NOTE — Discharge Instructions (Signed)
Please read and follow all provided instructions.  Your diagnoses today include:  1. Pyelonephritis     Tests performed today include:  Urine test - suggests that you have an infection in your bladder  Urine culture sent -is pending  Vital signs. See below for your results today.   Medications prescribed:   Keflex (cephalexin) - antibiotic  You have been prescribed an antibiotic medicine: take the entire course of medicine even if you are feeling better. Stopping early can cause the antibiotic not to work.   Zofran (ondansetron) - for nausea and vomiting  Home care instructions:  Follow any educational materials contained in this packet.  Take up to 500 mg of naproxen twice a day for your pain.  Follow-up instructions: Please follow-up with your primary care provider in 3 days for a recheck of your symptoms and to follow-up on your urine culture results.  Return instructions:   Please return to the Emergency Department if you experience worsening symptoms.   Return with fever, worsening pain, persistent vomiting, worsening pain in your back.   Please return if you have any other emergent concerns.  Additional Information:  Your vital signs today were: BP (!) 165/93 (BP Location: Right Arm)    Pulse 81    Temp 99 F (37.2 C) (Oral)    Resp 18    Ht 5\' 7"  (1.702 m)    Wt 78.9 kg    LMP 06/05/2008    SpO2 99%    BMI 27.25 kg/m  If your blood pressure (BP) was elevated above 135/85 this visit, please have this repeated by your doctor within one month. --------------

## 2019-04-19 NOTE — ED Provider Notes (Signed)
[facility] Provider Note   CSN: [id number] Arrival date & time: 04/19/19  1225     History   Chief Complaint Chief Complaint  Patient presents with  . Flank Pain    HPI Success Tamara Charles is a 58 y.o. female.     Patient with history of frequent UTIs, most recent UTI this summer --presents to the emergency department with 2 days of gradually worsening left-sided flank pain, dysuria, odor to urine.  Patient has not seen any blood in her urine.  Left-sided flank pain progressed today and was causing her to be nauseous, but she did not throw up.  She denies any fevers or shaking chills.  No chest pain or shortness of breath.  Patient has a history of diverticulitis but states that this feels much different.  No history of kidney stones.     Past Medical History:  Diagnosis Date  . Anxiety   . Hypertension   . Spontaneous pneumothorax    at 58 yo  . UTI (urinary tract infection)     Patient Active Problem List   Diagnosis Date Noted  . Nausea 01/02/2019  . 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 Left flank pain 06/26/2018  . Acute bronchitis 06/26/2018  . Lower abdominal pain 06/26/2018  . Diverticulitis 01/28/2018  . Diarrhea 01/28/2018  . Rash and nonspecific skin eruption 10/23/2017  . Hot flash not due to menopause 06/26/2016  . Herpes simplex 04/20/2015  . Weight gain 08/25/2014  . Well adult exam 02/23/2014  . Bleb, lung ([place]) 02/23/2014  . Family history of early CAD 02/23/2014  . Tobacco use disorder 02/23/2014  . Hypertension 04/21/2013  . Spontaneous pneumothorax 04/21/2013    Past Surgical History:  Procedure Laterality Date  . ABDOMINAL HYSTERECTOMY     partial  . BREAST SURGERY Bilateral    breast reduction  . LUNG SURGERY Bilateral 1997  . REDUCTION MAMMAPLASTY       OB History   No obstetric history on file.      Home Medications    Prior to Admission medications   Medication Sig Start Date End Date Taking?  Authorizing Provider  aspirin EC 81 MG tablet Take 1 tablet (81 mg total) by mouth daily. 02/23/14   Plotnikov, Evie Lacks, MD  calcium carbonate (ANTACID) 500 MG chewable tablet Chew 1 tablet by mouth 3 (three) times daily.    [provider]  cephALEXin (KEFLEX) 500 MG capsule Take 1 capsule (500 mg total) by mouth 3 (three) times daily. 04/19/19   Carlisle Cater, PA-C  naproxen sodium (ANAPROX) 220 MG tablet Take 220 mg by mouth 2 (two) times daily with a meal.    [provider]  nortriptyline (PAMELOR) 10 MG capsule TAKE 1 CAPSULE (10 MG TOTAL) BY MOUTH AT BEDTIME. 01/05/19   Plotnikov, Evie Lacks, MD  ondansetron (ZOFRAN ODT) 4 MG disintegrating tablet Take 1 tablet (4 mg total) by mouth every 8 (eight) hours as needed for nausea or vomiting. 04/19/19   Carlisle Cater, PA-C  polyethylene glycol-electrolytes (NULYTELY/GOLYTELY) 420 g solution Take 4,000 mLs by mouth as directed. 02/26/19   Milus Banister, MD  Probiotic Product (ALIGN) 4 MG CAPS Take 1 capsule (4 mg total) by mouth daily. 01/02/19   Plotnikov, Evie Lacks, MD  psyllium (METAMUCIL) 58.6 % packet Take 1 packet by mouth every other day.    [provider]  triamterene-hydrochlorothiazide (MAXZIDE-25) 37.5-25 MG tablet TAKE 1 TABLET BY MOUTH EVERY DAY 07/17/18   Plotnikov, Evie Lacks,  MD  valACYclovir (VALTREX) 500 MG tablet Take 1 tablet (500 mg total) by mouth 2 (two) times daily. 03/29/18   Plotnikov, Evie Lacks, MD    Family History Family History  Problem Relation Age of Onset  . Heart disease Mother   . Hypertension Mother   . Heart disease Brother   . Breast cancer Maternal Aunt   . Bladder Cancer Maternal Grandmother     Social History Social History   Tobacco Use  . Smoking status: Current Every Day Smoker    Packs/day: 0.50    Types: Cigarettes  . Smokeless tobacco: Never Used  Substance Use Topics  . Alcohol use: Yes    Comment: occ  . Drug use: Yes    Frequency: 7.0 times per week     Types: Marijuana    Comment: daily      Allergies   Augmentin [amoxicillin-pot clavulanate], Ciprofloxacin, and Flagyl [metronidazole]   Review of Systems Review of Systems  Constitutional: Negative for fever.  HENT: Negative for rhinorrhea and sore throat.   Eyes: Negative for redness.  Respiratory: Negative for cough.   Cardiovascular: Negative for chest pain.  Gastrointestinal: Positive for nausea. Negative for abdominal pain, diarrhea and vomiting.  Genitourinary: Positive for dysuria and flank pain. Negative for frequency and hematuria.  Musculoskeletal: Negative for myalgias.  Skin: Negative for rash.  Neurological: Negative for headaches.     Physical Exam Updated Vital Signs BP (!) 165/93 (BP Location: Right Arm)   Pulse 81   Temp 99 F (37.2 C) (Oral)   Resp 18   Ht 5\' 7"  (1.702 m)   Wt 78.9 kg   LMP 06/05/2008   SpO2 99%   BMI 27.25 kg/m   Physical Exam Vitals signs and nursing note reviewed.  Constitutional:      Appearance: She is well-developed.  HENT:     Head: Normocephalic and atraumatic.  Eyes:     General:        Right eye: No discharge.        Left eye: No discharge.     Conjunctiva/sclera: Conjunctivae normal.  Neck:     Musculoskeletal: Normal range of motion and neck supple.  Cardiovascular:     Rate and Rhythm: Normal rate and regular rhythm.     Heart sounds: Normal heart sounds.  Pulmonary:     Effort: Pulmonary effort is normal.     Breath sounds: Normal breath sounds.  Abdominal:     Palpations: Abdomen is soft.     Tenderness: There is abdominal tenderness. There is left CVA tenderness. There is no right CVA tenderness or guarding.     Comments: Mild left lateral abd pain.   Skin:    General: Skin is warm and dry.  Neurological:     Mental Status: She is alert.      ED Treatments / Results  Labs (all labs ordered are listed, but only abnormal results are displayed) Labs Reviewed  URINALYSIS, ROUTINE W REFLEX  MICROSCOPIC - Abnormal; Notable for the following components:      Result Value   APPearance CLOUDY (*)    Leukocytes,Ua MODERATE (*)    All other components within normal limits  URINALYSIS, MICROSCOPIC (REFLEX) - Abnormal; Notable for the following components:   Bacteria, UA FEW (*)    All other components within normal limits  URINE CULTURE    EKG None  Radiology No results found.  Procedures Procedures (including critical care time)  Medications Ordered in ED Medications -  No data to display   Initial Impression / Assessment and Plan / ED Course  I have reviewed the triage vital signs and the nursing notes.  Pertinent labs & imaging results that were available during my care of the patient were reviewed by me and considered in my medical decision making (see chart for details).        Patient seen and examined.  UA consistent with infection.  Patient looks well.  No fevers or active vomiting.  Given left flank pain, gradual onset suspect early pyelonephritis.  Patient looks well and nontoxic.  Culture sent.  We discussed treatment for pyelonephritis and follow-up with PCP in the next 48 to 72 hours or additional evaluation here with lab work and CT renal stone.  Patient would like to be treated and follow-up with her doctor which I think is completely reasonable given her well appearance.  She will be discharged home on Keflex, given her Cipro allergy, and Zofran.  We discussed signs and symptoms to return which include worsening uncontrolled pain, persistent vomiting, high persistent fever.  Encouraged use of over-the-counter naproxen for pain.  Patient verbalizes understanding agrees with plan.  Vital signs reviewed and are as follows: BP (!) 165/93 (BP Location: Right Arm)   Pulse 81   Temp 99 F (37.2 C) (Oral)   Resp 18   Ht 5\' 7"  (1.702 m)   Wt 78.9 kg   LMP 06/05/2008   SpO2 99%   BMI 27.25 kg/m      Final Clinical Impressions(s) / ED Diagnoses   Final  diagnoses:  Pyelonephritis   Patient with gradual onset of left sided flank pain with positive CVA tenderness, associated nausea but no vomiting, no fevers.  UA with white cells, red cells, bacteria.  Patient reports dysuria, cloudy urine, odor to urine.  She has had diverticulitis in the past and this feels different.  She has never had a kidney stone but onset was not abrupt and patient appears comfortable.  We will treat clinical pyelonephritis and have patient follow-up closely with her doctor given strict instructions as above.   ED Discharge Orders         Ordered    cephALEXin (KEFLEX) 500 MG capsule  3 times daily     04/19/19 1423    ondansetron (ZOFRAN ODT) 4 MG disintegrating tablet  Every 8 hours PRN     04/19/19 1423           Carlisle Cater, PA-C 04/19/19 1432    Ezequiel Essex, MD 04/19/19 1726

## 2019-04-19 NOTE — ED Notes (Signed)
ED Provider at bedside. 

## 2019-04-19 NOTE — ED Notes (Signed)
Pt ambulated to d/c window with steady gait. Verbalized understanding to pick up Rx at pharmacy listed on d/c instructions

## 2019-04-21 LAB — URINE CULTURE: Culture: 70000 — AB

## 2019-04-22 ENCOUNTER — Telehealth: Payer: Self-pay | Admitting: Emergency Medicine

## 2019-04-22 NOTE — Telephone Encounter (Signed)
Post ED Visit - Positive Culture Follow-up: Successful Patient Follow-Up  Culture assessed and recommendations reviewed by:  []  Elenor Quinones, Pharm.D. []  Heide Guile, Pharm.D., BCPS AQ-ID []  Parks Neptune, Pharm.D., BCPS [x]  Alycia Rossetti, Pharm.D., BCPS []  Renfrow, Pharm.D., BCPS, AAHIVP []  Legrand Como, Pharm.D., BCPS, AAHIVP []  Salome Arnt, PharmD, BCPS []  Johnnette Gourd, PharmD, BCPS []  Hughes Better, PharmD, BCPS []  Leeroy Cha, PharmD  Positive urine culture  []  Patient discharged without antimicrobial prescription and treatment is now indicated [x]  Organism is resistant to prescribed ED discharge antimicrobial []  Patient with positive blood cultures  Changes discussed with ED provider: Delia Heady PA New antibiotic prescription stop cephalexin, start bactrim DS 1 tab po twice daily x 7 days Called to [facility] @ [phone]   Hazle Nordmann 04/22/2019, 3:51 PM

## 2019-04-22 NOTE — Progress Notes (Signed)
ED Antimicrobial Stewardship Positive Culture Follow Up   Tamara Charles is an 58 y.o. female who presented to [facility] on 04/19/2019 with a chief complaint of  Chief Complaint  Patient presents with  . Flank Pain    Recent Results (from the past 720 hour(s))  Urine culture     Status: Abnormal   Collection Time: 04/19/19 12:43 PM   Specimen: Urine, Random  Result Value Ref Range Status   Specimen Description   Final    URINE, RANDOM Performed at [facility], [address] [place], [place] [address]    Special Requests   Final    NONE Performed at [facility], [address] [place], [place] [address]    Culture 70,000 COLONIES/mL PROTEUS VULGARIS (A)  Final   Report Status 04/21/2019 FINAL  Final   Organism ID, Bacteria PROTEUS VULGARIS (A)  Final      Susceptibility   Proteus vulgaris - MIC*    AMPICILLIN >=32 RESISTANT Resistant     CEFAZOLIN >=64 RESISTANT Resistant     CEFTRIAXONE <=1 SENSITIVE Sensitive     CIPROFLOXACIN <=0.25 SENSITIVE Sensitive     GENTAMICIN <=1 SENSITIVE Sensitive     IMIPENEM 4 SENSITIVE Sensitive     NITROFURANTOIN 128 RESISTANT Resistant     TRIMETH/SULFA <=20 SENSITIVE Sensitive     AMPICILLIN/SULBACTAM 8 SENSITIVE Sensitive     PIP/TAZO <=4 SENSITIVE Sensitive     * 70,000 COLONIES/mL PROTEUS VULGARIS    [x]  Treated with cephalexin, organism resistant to prescribed antimicrobial []  Patient discharged originally without antimicrobial agent and treatment is now indicated  New antibiotic prescription: Bactrim DS 1 tablet by mouth twice daily for 7 days.  ED Provider: Delia Heady, PA-C   Ladoris Gene, PharmD Candidate 04/22/2019, 10:43 AM

## 2019-05-05 ENCOUNTER — Ambulatory Visit (INDEPENDENT_AMBULATORY_CARE_PROVIDER_SITE_OTHER): Payer: Medicaid Other | Admitting: Internal Medicine

## 2019-05-05 ENCOUNTER — Encounter: Payer: Self-pay | Admitting: Internal Medicine

## 2019-05-05 DIAGNOSIS — J208 Acute bronchitis due to other specified organisms: Secondary | ICD-10-CM

## 2019-05-05 DIAGNOSIS — N390 Urinary tract infection, site not specified: Secondary | ICD-10-CM

## 2019-05-05 MED ORDER — AZITHROMYCIN 250 MG PO TABS: ORAL_TABLET | ORAL | 0 refills | Status: DC

## 2019-05-05 NOTE — Assessment & Plan Note (Signed)
Proteus vulgaris UA and Cx

## 2019-05-05 NOTE — Assessment & Plan Note (Signed)
[facility]

## 2019-05-05 NOTE — Addendum Note (Signed)
Addended by: Cassandria Anger on: 05/05/2019 05:03 PM   Modules accepted: Orders

## 2019-05-05 NOTE — Progress Notes (Signed)
Virtual Visit via Telephone Note  I connected with Tamara Charles on 05/05/19 at  2:40 PM EST by telephone and verified that I am speaking with the correct person using two identifiers.   I discussed the limitations, risks, security and privacy concerns of performing an evaluation and management service by telephone and the availability of in person appointments. I also discussed with the patient that there may be a patient responsible charge related to this service. The patient expressed understanding and agreed to proceed.   History of Present Illness:   C/o URI sx's x 1 d Chills, brown mucus, ST, cough, arthralgias F/u UTI  Observations/Objective:  Sounds hoarse, NAD. Assessment and Plan:  See Plan Follow Up Instructions:    I discussed the assessment and treatment plan with the patient. The patient was provided an opportunity to ask questions and all were answered. The patient agreed with the plan and demonstrated an understanding of the instructions.   The patient was advised to call back or seek an in-person evaluation if the symptoms worsen or if the condition fails to improve as anticipated.  I provided 11 minutes of non-face-to-face time during this encounter.   Walker Kehr, MD

## 2019-05-07 ENCOUNTER — Ambulatory Visit: Payer: Medicaid Other | Admitting: Internal Medicine

## 2019-05-22 ENCOUNTER — Other Ambulatory Visit (INDEPENDENT_AMBULATORY_CARE_PROVIDER_SITE_OTHER): Payer: Medicaid Other

## 2019-05-22 DIAGNOSIS — N390 Urinary tract infection, site not specified: Secondary | ICD-10-CM

## 2019-05-22 LAB — URINALYSIS
Bilirubin Urine: NEGATIVE
Hgb urine dipstick: NEGATIVE
Ketones, ur: NEGATIVE
Leukocytes,Ua: NEGATIVE
Nitrite: NEGATIVE
Specific Gravity, Urine: 1.01 (ref 1.000–1.030)
Total Protein, Urine: NEGATIVE
Urine Glucose: NEGATIVE
Urobilinogen, UA: 0.2 (ref 0.0–1.0)
pH: 7.5 (ref 5.0–8.0)

## 2019-05-24 LAB — CULTURE, URINE COMPREHENSIVE
MICRO NUMBER:: 1208368
RESULT:: NO GROWTH
SPECIMEN QUALITY:: ADEQUATE

## 2019-06-04 ENCOUNTER — Other Ambulatory Visit: Payer: Self-pay | Admitting: Internal Medicine

## 2019-06-04 DIAGNOSIS — B009 Herpesviral infection, unspecified: Secondary | ICD-10-CM

## 2019-07-09 ENCOUNTER — Encounter: Payer: Self-pay | Admitting: Internal Medicine

## 2019-07-09 ENCOUNTER — Ambulatory Visit (INDEPENDENT_AMBULATORY_CARE_PROVIDER_SITE_OTHER): Payer: 59 | Admitting: Internal Medicine

## 2019-07-09 ENCOUNTER — Encounter: Payer: Self-pay | Admitting: Family Medicine

## 2019-07-09 ENCOUNTER — Ambulatory Visit: Payer: Self-pay

## 2019-07-09 ENCOUNTER — Ambulatory Visit (INDEPENDENT_AMBULATORY_CARE_PROVIDER_SITE_OTHER): Payer: 59 | Admitting: Family Medicine

## 2019-07-09 ENCOUNTER — Other Ambulatory Visit: Payer: Self-pay

## 2019-07-09 ENCOUNTER — Ambulatory Visit: Payer: 59

## 2019-07-09 VITALS — BP 124/82 | HR 70 | Ht 67.0 in | Wt 180.0 lb

## 2019-07-09 VITALS — BP 124/82 | HR 70 | Temp 98.5°F | Ht 67.0 in | Wt 180.0 lb

## 2019-07-09 DIAGNOSIS — R202 Paresthesia of skin: Secondary | ICD-10-CM

## 2019-07-09 DIAGNOSIS — K5792 Diverticulitis of intestine, part unspecified, without perforation or abscess without bleeding: Secondary | ICD-10-CM

## 2019-07-09 DIAGNOSIS — E559 Vitamin D deficiency, unspecified: Secondary | ICD-10-CM

## 2019-07-09 DIAGNOSIS — M25561 Pain in right knee: Secondary | ICD-10-CM | POA: Diagnosis not present

## 2019-07-09 DIAGNOSIS — M79672 Pain in left foot: Secondary | ICD-10-CM

## 2019-07-09 DIAGNOSIS — M7989 Other specified soft tissue disorders: Secondary | ICD-10-CM | POA: Diagnosis not present

## 2019-07-09 DIAGNOSIS — R739 Hyperglycemia, unspecified: Secondary | ICD-10-CM | POA: Diagnosis not present

## 2019-07-09 DIAGNOSIS — M79671 Pain in right foot: Secondary | ICD-10-CM

## 2019-07-09 LAB — VITAMIN B12: Vitamin B-12: 250 pg/mL (ref 211–911)

## 2019-07-09 LAB — BASIC METABOLIC PANEL
BUN: 18 mg/dL (ref 6–23)
CO2: 30 mEq/L (ref 19–32)
Calcium: 9.3 mg/dL (ref 8.4–10.5)
Chloride: 101 mEq/L (ref 96–112)
Creatinine, Ser: 0.81 mg/dL (ref 0.40–1.20)
GFR: 72.87 mL/min (ref >60.00)
Glucose, Bld: 86 mg/dL (ref 70–99)
Potassium: 3.9 mEq/L (ref 3.5–5.1)
Sodium: 137 mEq/L (ref 135–145)

## 2019-07-09 LAB — CBC WITH DIFFERENTIAL/PLATELET
Basophils Absolute: 0.1 10*3/uL (ref 0.0–0.1)
Basophils Relative: 1.1 % (ref 0.0–3.0)
Eosinophils Absolute: 0.2 10*3/uL (ref 0.0–0.7)
Eosinophils Relative: 4.1 % (ref 0.0–5.0)
HCT: 42.3 % (ref 36.0–46.0)
Hemoglobin: 14.3 g/dL (ref 12.0–15.0)
Lymphocytes Relative: 31 % (ref 12.0–46.0)
Lymphs Abs: 1.7 10*3/uL (ref 0.7–4.0)
MCHC: 33.7 g/dL (ref 30.0–36.0)
MCV: 99 fl (ref 78.0–100.0)
Monocytes Absolute: 0.5 10*3/uL (ref 0.1–1.0)
Monocytes Relative: 8.7 % (ref 3.0–12.0)
Neutro Abs: 3 10*3/uL (ref 1.4–7.7)
Neutrophils Relative %: 55.1 % (ref 43.0–77.0)
Platelets: 294 10*3/uL (ref 150.0–400.0)
RBC: 4.27 Mil/uL (ref 3.87–5.11)
RDW: 13.9 % (ref 11.5–15.5)
WBC: 5.4 10*3/uL (ref 4.0–10.5)

## 2019-07-09 LAB — VITAMIN D 25 HYDROXY (VIT D DEFICIENCY, FRACTURES): VITD: 32.21 ng/mL (ref 30.00–100.00)

## 2019-07-09 LAB — HEMOGLOBIN A1C: Hgb A1c MFr Bld: 5.6 % (ref 4.6–6.5)

## 2019-07-09 LAB — SEDIMENTATION RATE: Sed Rate: 5 mm/hr (ref 0–30)

## 2019-07-09 LAB — TSH: TSH: 0.65 u[IU]/mL (ref 0.35–4.50)

## 2019-07-09 MED ORDER — GABAPENTIN 100 MG PO CAPS: 100.0000 mg | ORAL_CAPSULE | Freq: Three times a day (TID) | ORAL | 3 refills | Status: DC | PRN

## 2019-07-09 MED ORDER — VITAMIN D3 50 MCG (2000 UT) PO CAPS: 2000.0000 [IU] | ORAL_CAPSULE | Freq: Every day | ORAL | 3 refills | Status: AC

## 2019-07-09 MED ORDER — B COMPLEX PO TABS: 1.0000 | ORAL_TABLET | Freq: Every day | ORAL | 3 refills | Status: AC

## 2019-07-09 MED ORDER — MELOXICAM 15 MG PO TABS: 15.0000 mg | ORAL_TABLET | Freq: Every day | ORAL | 1 refills | Status: DC

## 2019-07-09 NOTE — Patient Instructions (Signed)
Thank you for coming in today. I think the foot pain is mostly due to Metatarsalgia.  I also think you have a ganglion cyst on the bottom of your foot.  Try Voltaren gel over the counter on the knee and foot.  Use the metarsal pads (medium from [employer]) in your shoes.  You should hear soon about blood clot test.  Recheck in 3 weeks.  Return sooner if needed.    Ganglion Cyst  A ganglion cyst is a non-cancerous, fluid-filled lump that occurs near a joint or tendon. The cyst grows out of a joint or the lining of a tendon. Ganglion cysts most often develop in the hand or wrist, but they can also develop in the shoulder, elbow, hip, knee, ankle, or foot. Ganglion cysts are ball-shaped or egg-shaped. Their size can range from the size of a pea to larger than a grape. Increased activity may cause the cyst to get bigger because more fluid starts to build up. What are the causes? The exact cause of this condition is not known, but it may be related to:  Inflammation or irritation around the joint.  An injury.  Repetitive movements or overuse.  Arthritis. What increases the risk? You are more likely to develop this condition if:  You are a woman.  You are 31-47 years old. What are the signs or symptoms? The main symptom of this condition is a lump. It most often appears on the hand or wrist. In many cases, there are no other symptoms, but a cyst can sometimes cause:  Tingling.  Pain.  Numbness.  Muscle weakness.  Weak grip.  Less range of motion in a joint. How is this diagnosed? Ganglion cysts are usually diagnosed based on a physical exam. Your health care provider will feel the lump and may shine a light next to it. If it is a ganglion cyst, the light will likely shine through it. Your health care provider may order an X-ray, ultrasound, or MRI to rule out other conditions. How is this treated? Ganglion cysts often go away on their own without treatment. If you have pain or  other symptoms, treatment may be needed. Treatment is also needed if the ganglion cyst limits your movement or if it gets infected. Treatment may include:  Wearing a brace or splint on your wrist or finger.  Taking anti-inflammatory medicine.  Having fluid drained from the lump with a needle (aspiration).  Getting a steroid injected into the joint.  Having surgery to remove the ganglion cyst.  Placing a pad on your shoe or wearing shoes that will not rub against the cyst if it is on your foot. Follow these instructions at home:  Do not press on the ganglion cyst, poke it with a needle, or hit it.  Take over-the-counter and prescription medicines only as told by your health care provider.  If you have a brace or splint: ? Wear it as told by your health care provider. ? Remove it as told by your health care provider. Ask if you need to remove it when you take a shower or a bath.  Watch your ganglion cyst for any changes.  Keep all follow-up visits as told by your health care provider. This is important. Contact a health care provider if:  Your ganglion cyst becomes larger or more painful.  You have pus coming from the lump.  You have weakness or numbness in the affected area.  You have a fever or chills. Get help right away  if:  You have a fever and have any of these in the cyst area: ? Increased redness. ? Red streaks. ? Swelling. Summary  A ganglion cyst is a non-cancerous, fluid-filled lump that occurs near a joint or tendon.  Ganglion cysts most often develop in the hand or wrist, but they can also develop in the shoulder, elbow, hip, knee, ankle, or foot.  Ganglion cysts often go away on their own without treatment. This information is not intended to replace advice given to you by your health care provider. Make sure you discuss any questions you have with your health care provider. Document Revised: 05/04/2017 Document Reviewed: 01/19/2017 Elsevier Patient  Education  [place].

## 2019-07-09 NOTE — Assessment & Plan Note (Addendum)
Labs incl B12 Gabapentin - low dose B complex

## 2019-07-09 NOTE — Assessment & Plan Note (Signed)
Labs

## 2019-07-09 NOTE — Assessment & Plan Note (Signed)
Recurrent  No relapse lately

## 2019-07-09 NOTE — Progress Notes (Signed)
Subjective:  Patient ID: Tamara Charles, female    DOB: 1960/06/16  Age: 59 y.o. MRN: [id number]  CC: No chief complaint on file.   HPI Donnett Eimers presents for Bfeet pain - like a ball under the toes, burning C/o R knee swollen - walking 6 mi a day 3-4 - worse w/walking. Pt stopped walking. Took Aleve w/o efffect  Outpatient Medications Prior to Visit  Medication Sig Dispense Refill  . aspirin EC 81 MG tablet Take 1 tablet (81 mg total) by mouth daily. 100 tablet 3  . calcium carbonate (ANTACID) 500 MG chewable tablet Chew 1 tablet by mouth 3 (three) times daily.    . naproxen sodium (ANAPROX) 220 MG tablet Take 220 mg by mouth 2 (two) times daily with a meal.    . nortriptyline (PAMELOR) 10 MG capsule TAKE 1 CAPSULE (10 MG TOTAL) BY MOUTH AT BEDTIME. 90 capsule 2  . polyethylene glycol-electrolytes (NULYTELY/GOLYTELY) 420 g solution Take 4,000 mLs by mouth as directed. 4000 mL 0  . Probiotic Product (ALIGN) 4 MG CAPS Take 1 capsule (4 mg total) by mouth daily. 30 capsule 1  . psyllium (METAMUCIL) 58.6 % packet Take 1 packet by mouth every other day.    . triamterene-hydrochlorothiazide (MAXZIDE-25) 37.5-25 MG tablet TAKE 1 TABLET BY MOUTH EVERY DAY 90 tablet 3  . azithromycin (ZITHROMAX Z-PAK) 250 MG tablet As directed 6 tablet 0  . cephALEXin (KEFLEX) 500 MG capsule Take 1 capsule (500 mg total) by mouth 3 (three) times daily. 21 capsule 0  . ondansetron (ZOFRAN ODT) 4 MG disintegrating tablet Take 1 tablet (4 mg total) by mouth every 8 (eight) hours as needed for nausea or vomiting. 10 tablet 0  . valACYclovir (VALTREX) 500 MG tablet TAKE 1 TABLET BY MOUTH TWICE A DAY 20 tablet 3   No facility-administered medications prior to visit.    ROS: Review of Systems  Constitutional: Negative for activity change, appetite change, chills, fatigue and unexpected weight change.  HENT: Negative for congestion, mouth sores and sinus pressure.   Eyes: Negative for visual disturbance.    Respiratory: Negative for cough and chest tightness.   Gastrointestinal: Negative for abdominal pain and nausea.  Genitourinary: Negative for difficulty urinating, frequency and vaginal pain.  Musculoskeletal: Positive for arthralgias and gait problem. Negative for back pain and myalgias.  Skin: Negative for pallor and rash.  Neurological: Negative for dizziness, tremors, weakness, numbness and headaches.  Psychiatric/Behavioral: Negative for confusion, sleep disturbance and suicidal ideas.    Objective:  BP 124/82 (BP Location: Left Arm, Patient Position: Sitting, Cuff Size: Large)   Pulse 70   Temp 98.5 F (36.9 C) (Oral)   Ht 5\' 7"  (1.702 m)   Wt 180 lb (81.6 kg)   LMP 06/05/2008   SpO2 97%   BMI 28.19 kg/m   BP Readings from Last 3 Encounters:  07/09/19 124/82  04/19/19 (!) 165/93  02/26/19 126/76    Wt Readings from Last 3 Encounters:  07/09/19 180 lb (81.6 kg)  04/19/19 174 lb (78.9 kg)  02/26/19 176 lb (79.8 kg)    Physical Exam Constitutional:      General: She is not in acute distress.    Appearance: She is well-developed.  HENT:     Head: Normocephalic.     Right Ear: External ear normal.     Left Ear: External ear normal.     Nose: Nose normal.  Eyes:     General:  Right eye: No discharge.        Left eye: No discharge.     Conjunctiva/sclera: Conjunctivae normal.     Pupils: Pupils are equal, round, and reactive to light.  Neck:     Thyroid: No thyromegaly.     Vascular: No JVD.     Trachea: No tracheal deviation.  Cardiovascular:     Rate and Rhythm: Normal rate and regular rhythm.     Heart sounds: Normal heart sounds.  Pulmonary:     Effort: No respiratory distress.     Breath sounds: No stridor. No wheezing.  Abdominal:     General: Bowel sounds are normal. There is no distension.     Palpations: Abdomen is soft. There is no mass.     Tenderness: There is no abdominal tenderness. There is no guarding or rebound.  Musculoskeletal:         General: Tenderness present.     Cervical back: Normal range of motion and neck supple.  Lymphadenopathy:     Cervical: No cervical adenopathy.  Skin:    Findings: No erythema or rash.  Neurological:     Cranial Nerves: No cranial nerve deficit.     Motor: No abnormal muscle tone.     Coordination: Coordination normal.     Gait: Gait abnormal.     Deep Tendon Reflexes: Reflexes normal.  Psychiatric:        Behavior: Behavior normal.        Thought Content: Thought content normal.        Judgment: Judgment normal.    R arch ganglion High arches B soles - tender  Lab Results  Component Value Date   WBC 8.7 12/03/2018   HGB 13.8 12/03/2018   HCT 41.2 12/03/2018   PLT 271 12/03/2018   GLUCOSE 121 (H) 12/03/2018   CHOL 191 01/28/2018   TRIG 111.0 01/28/2018   HDL 43.70 01/28/2018   LDLDIRECT 146.0 08/25/2014   LDLCALC 125 (H) 01/28/2018   ALT 11 12/03/2018   AST 17 12/03/2018   NA 138 12/03/2018   K 3.6 12/03/2018   CL 102 12/03/2018   CREATININE 0.63 12/03/2018   BUN 15 12/03/2018   CO2 25 12/03/2018   TSH 1.13 01/28/2018    No results found.  Assessment & Plan:     Follow-up: No follow-ups on file.  Walker Kehr, MD

## 2019-07-09 NOTE — Progress Notes (Signed)
Subjective:    I'm seeing this patient as a consultation for:  Dr. Alain Marion. Note will be routed back to referring provider/PCP.  CC: B foot pain and paresthesias  I, [name], LAT, ATC, am serving as scribe for Dr. Lynne Leader.  HPI: Pt is a 59 y/o female c/o B foot pain and paresthesias x a few months .  Pt rates her pain at a 7/10 and describes her pain as burning.  She states that she feels like she has something balled up under the toes in her feet.  She also notes that she has a nodule on the plantar arch that is a bit tender. Additionally she has some pain in the right lateral knee with a little bit of swelling.  She notes this has been going on for a few weeks and thinks it is because she has been walking abnormally because of her foot pain.  She is a bit worried about the possibility of a DVT.  Her sister mother and father have all had DVTs. Tamara Charles does not have a history of DVT herself.   She notes that she typically has been walking about 4 miles per day despite her normal fitness routine.  A few months ago prior to the pain starting she increased her mileage to about 6 miles per day.  Radiating pain: yes into her R anterior lower leg and up along the R lateral knee Numbness/tingling: Yes in B feet Low back pain: Yes Aggravating factors: Walking, shopping Treatments tried: Lidocaine; Aleve; Gabapentin, Meloxicam  Past medical history, Surgical history, Family history, Social history, Allergies, and medications have been entered into the medical record, reviewed.  Family history DVT as above.  Review of Systems: No new headache, visual changes, nausea, vomiting, diarrhea, constipation, dizziness, abdominal pain, skin rash, fevers, chills, night sweats, weight loss, swollen lymph nodes, body aches, joint swelling, muscle aches, chest pain, shortness of breath, mood changes, visual or auditory hallucinations.   Objective:    Vitals:   07/09/19 1448  BP: 124/82  Pulse: 70   SpO2: 97%   General: Well Developed, well nourished, and in no acute distress.  Neuro/Psych: Alert and oriented x3, extra-ocular muscles intact, able to move all 4 extremities, sensation grossly intact. Skin: Warm and dry, no rashes noted.  Respiratory: Not using accessory muscles, speaking in full sentences, trachea midline.  Cardiovascular: Pulses palpable, no extremity edema. Abdomen: Does not appear distended. MSK:  Right knee: Normal-appearing Normal motion. Mildly tender palpation lateral knee and IT band region. Stable ligamentous exam. Intact strength.   Right leg: Calf normal-appearing no significant swelling or palpable cords.  Right ankle normal-appearing nontender normal motion.  Right foot: Preserved longitudinal arch. Flattened transverse arch with mild callus formation plantar metatarsal heads 2,3,4. Palpable flat nodule at the longitudinal arch mildly tender. Mildly tender palpation plantar metatarsal heads 2, 3 and 4. Normal gait.  Lab and Radiology Results  X-ray images right foot and right knee ordered today but not completed.  Will be scheduled for near future.  Limited musculoskeletal ultrasound right foot Nodule at plantar mid arch visualized.  Appears cystic and superficial.  Measuring approximately 1 cm in length and about 1 cm in depth. Plantar fascia visualized normal-appearing. Normal bony structures otherwise. Impression: Ganglion cyst longitudinal arch    Impression and Recommendations:    Assessment and Plan: 59 y.o. female with   Right foot pain.  Pain predominantly at metatarsal heads 2, 3, and 4.  Pain occurred after increasing activity.  Stress fracture is a possibility but much less likely.  Metatarsalgia most likely explanation.  Plan for treatment with metatarsal pads.  Additionally recommend trial of Voltaren gel.  Check back in 3 to 4 weeks.  Plantar arch nodule: Ganglion cyst per ultrasound.  Discussed options.  Patient would like  to avoid injection if possible.  Trial of Voltaren gel.  If not better proceed with injection.  Right lateral knee pain: Secondary most likely to altered gait due to foot pain.  Trial of Voltaren gel.  Concern for DVT: Patient does have a family history of DVT and she is concerned about possibility of DVT.  Given her pain and a bit of swelling it is reasonable to proceed with duplex ultrasound to rule out DVT.[name]   Orders Placed This Encounter  Procedures  . [place] LIMITED JOINT SPACE STRUCTURES LOW BILAT(NO LINKED CHARGES)    Order Specific Question:   Reason for Exam (SYMPTOM  OR DIAGNOSIS REQUIRED)    Answer:   B foot pain    Order Specific Question:   Preferred imaging location?    Answer:   [place]  . DG Foot Complete Right    Standing Status:   Future    Number of Occurrences:   1    Standing Expiration Date:   09/05/2020    Order Specific Question:   Reason for Exam (SYMPTOM  OR DIAGNOSIS REQUIRED)    Answer:   eval foot pain and swelling    Order Specific Question:   Is patient pregnant?    Answer:   No    Order Specific Question:   Preferred imaging location?    Answer:   [facility]    Order Specific Question:   Radiology Contrast Protocol - do NOT remove file path    Answer:   \\[email or web address]\DXFluoroContrastProtocols.pdf  . DG Knee AP/LAT W/Sunrise Right    Standing Status:   Future    Number of Occurrences:   1    Standing Expiration Date:   09/05/2020    Order Specific Question:   Reason for Exam (SYMPTOM  OR DIAGNOSIS REQUIRED)    Answer:   eval right lateral knee pain    Order Specific Question:   Is patient pregnant?    Answer:   No    Order Specific Question:   Preferred imaging location?    Answer:   [facility]    Order Specific Question:   Radiology Contrast Protocol - do NOT remove file path    Answer:   \\[email or web address]\Radiant\DXFluoroContrastProtocols.pdf   No orders of the defined types were placed  in this encounter.   Discussed warning signs or symptoms. Please see discharge instructions. Patient expresses understanding.   The above documentation has been reviewed and is accurate and complete Lynne Leader

## 2019-07-10 ENCOUNTER — Ambulatory Visit (HOSPITAL_COMMUNITY)
Admission: RE | Admit: 2019-07-10 | Discharge: 2019-07-10 | Disposition: A | Discharge status: Home / Self Care | Payer: 59 | Source: Ambulatory Visit | Attending: Cardiovascular Disease | Admitting: Cardiovascular Disease

## 2019-07-10 DIAGNOSIS — M79671 Pain in right foot: Secondary | ICD-10-CM | POA: Insufficient documentation

## 2019-07-10 DIAGNOSIS — M79672 Pain in left foot: Secondary | ICD-10-CM

## 2019-07-10 DIAGNOSIS — M7989 Other specified soft tissue disorders: Secondary | ICD-10-CM | POA: Diagnosis not present

## 2019-07-11 NOTE — Progress Notes (Signed)
No DVT present in either leg. No Baker's cyst seen right knee. No DVT left leg.

## 2019-07-28 ENCOUNTER — Ambulatory Visit (INDEPENDENT_AMBULATORY_CARE_PROVIDER_SITE_OTHER): Payer: 59 | Admitting: Family

## 2019-07-28 DIAGNOSIS — J069 Acute upper respiratory infection, unspecified: Secondary | ICD-10-CM

## 2019-07-28 DIAGNOSIS — N390 Urinary tract infection, site not specified: Secondary | ICD-10-CM

## 2019-07-28 MED ORDER — CEFUROXIME AXETIL 250 MG PO TABS: 250.0000 mg | ORAL_TABLET | Freq: Two times a day (BID) | ORAL | 0 refills | Status: AC

## 2019-07-28 NOTE — Progress Notes (Signed)
Tamara Charles is a 59 y.o. female with the following history as recorded in [employer]:  Patient Active Problem List   Diagnosis Date Noted  . Paresthesia 07/09/2019  . Hyperglycemia 07/09/2019  . Foot pain, bilateral 07/09/2019  . Vitamin D deficiency 07/09/2019  . UTI (urinary tract infection) 05/05/2019  . Nausea 01/02/2019  . 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 Left flank pain 06/26/2018  . Acute bronchitis 06/26/2018  . Lower abdominal pain 06/26/2018  . Diverticulitis 01/28/2018  . Diarrhea 01/28/2018  . Rash and nonspecific skin eruption 10/23/2017  . Hot flash not due to menopause 06/26/2016  . Herpes simplex 04/20/2015  . Weight gain 08/25/2014  . Well adult exam 02/23/2014  . Bleb, lung ([place]) 02/23/2014  . Family history of early CAD 02/23/2014  . Tobacco use disorder 02/23/2014  . Hypertension 04/21/2013  . Spontaneous pneumothorax 04/21/2013    Current Outpatient Medications  Medication Sig Dispense Refill  . aspirin EC 81 MG tablet Take 1 tablet (81 mg total) by mouth daily. 100 tablet 3  . b complex vitamins tablet Take 1 tablet by mouth daily. 100 tablet 3  . calcium carbonate (ANTACID) 500 MG chewable tablet Chew 1 tablet by mouth 3 (three) times daily.    . cefUROXime (CEFTIN) 250 MG tablet Take 1 tablet (250 mg total) by mouth 2 (two) times daily with a meal for 10 days. 20 tablet 0  . Cholecalciferol (VITAMIN D3) 50 MCG (2000 UT) capsule Take 1 capsule (2,000 Units total) by mouth daily. 100 capsule 3  . gabapentin (NEURONTIN) 100 MG capsule Take 1-2 capsules (100-200 mg total) by mouth 3 (three) times daily as needed. 90 capsule 3  . meloxicam (MOBIC) 15 MG tablet Take 1 tablet (15 mg total) by mouth daily. 30 tablet 1  . naproxen sodium (ANAPROX) 220 MG tablet Take 220 mg by mouth 2 (two) times daily with a meal.    . nortriptyline (PAMELOR) 10 MG capsule TAKE 1 CAPSULE (10 MG TOTAL) BY MOUTH AT BEDTIME. 90 capsule 2  . polyethylene glycol-electrolytes  (NULYTELY/GOLYTELY) 420 g solution Take 4,000 mLs by mouth as directed. 4000 mL 0  . Probiotic Product (ALIGN) 4 MG CAPS Take 1 capsule (4 mg total) by mouth daily. 30 capsule 1  . psyllium (METAMUCIL) 58.6 % packet Take 1 packet by mouth every other day.    . triamterene-hydrochlorothiazide (MAXZIDE-25) 37.5-25 MG tablet TAKE 1 TABLET BY MOUTH EVERY DAY 90 tablet 3   No current facility-administered medications for this visit.    Allergies: Augmentin [amoxicillin-pot clavulanate], Ciprofloxacin, and Flagyl [metronidazole]  Past Medical History:  Diagnosis Date  . Anxiety   . Hypertension   . Spontaneous pneumothorax    at 59 yo  . UTI (urinary tract infection)     Past Surgical History:  Procedure Laterality Date  . ABDOMINAL HYSTERECTOMY     partial  . BREAST SURGERY Bilateral    breast reduction  . LUNG SURGERY Bilateral 1997  . REDUCTION MAMMAPLASTY      Family History  Problem Relation Age of Onset  . Heart disease Mother   . Hypertension Mother   . Heart disease Brother   . Breast cancer Maternal Aunt   . Bladder Cancer Maternal Grandmother     Social History   Tobacco Use  . Smoking status: Current Every Day Smoker    Packs/day: 0.50    Types: Cigarettes  . Smokeless tobacco: Never Used  Substance Use Topics  . Alcohol use: Yes  Comment: occ    Subjective:   I connected with [name] on 07/28/19 at  3:00 PM EST by a telephone call and verified that I am speaking with the correct person using two identifiers.   I discussed the limitations of evaluation and management by telemedicine and the availability of in person appointments. The patient expressed understanding and agreed to proceed. Provider in office/ patient is at home; provider and patient are only 2 people on video call.   Patient is concerned that she has a UTI- possible early kidney infection as well as a sinus infection; notes she has been having urinary discomfort/ cloudy urine for the  past few days and is having some pain in her left lower back; did have history of pyelonephritis last November; temperature was as high as 101 earlier today but has since broken;   Also notes that she started today with sudden onset of sinus pain/ pressure and congestion; denies any concerns for COVID exposure and does not want to get a COVID test at this time;    Objective:  There were no vitals filed for this visit.  Lungs: Respirations unlabored;  Neurologic: Alert and oriented; speech intact; face symmetrical;   Assessment:  1. Urinary tract infection without hematuria, site unspecified   2. Acute URI     Plan:  Patient defers COVID testing at this time; will treat with Ceftin x 10 days to cover for urinary and sinus source; she will increase fluids and let us know if symptoms do not improve; if symptoms persist, she will need to go to [facility] for in person evaluation;   Time spent 12 minutes   No follow-ups on file.  No orders of the defined types were placed in this encounter.   Requested Prescriptions   Signed Prescriptions Disp Refills  . cefUROXime (CEFTIN) 250 MG tablet 20 tablet 0    Sig: Take 1 tablet (250 mg total) by mouth 2 (two) times daily with a meal for 10 days.

## 2019-07-30 ENCOUNTER — Encounter (HOSPITAL_BASED_OUTPATIENT_CLINIC_OR_DEPARTMENT_OTHER): Payer: Self-pay

## 2019-07-30 ENCOUNTER — Other Ambulatory Visit: Payer: Self-pay

## 2019-07-30 ENCOUNTER — Ambulatory Visit: Payer: 59 | Admitting: Family Medicine

## 2019-07-30 ENCOUNTER — Emergency Department (HOSPITAL_BASED_OUTPATIENT_CLINIC_OR_DEPARTMENT_OTHER): Payer: 59

## 2019-07-30 ENCOUNTER — Emergency Department (HOSPITAL_BASED_OUTPATIENT_CLINIC_OR_DEPARTMENT_OTHER)
Admission: EM | Admit: 2019-07-30 | Discharge: 2019-07-30 | Disposition: A | Discharge status: Home / Self Care | Payer: 59 | Attending: Emergency Medicine | Admitting: Emergency Medicine

## 2019-07-30 DIAGNOSIS — F17210 Nicotine dependence, cigarettes, uncomplicated: Secondary | ICD-10-CM | POA: Diagnosis not present

## 2019-07-30 DIAGNOSIS — N12 Tubulo-interstitial nephritis, not specified as acute or chronic: Secondary | ICD-10-CM

## 2019-07-30 DIAGNOSIS — Z79899 Other long term (current) drug therapy: Secondary | ICD-10-CM | POA: Diagnosis not present

## 2019-07-30 DIAGNOSIS — N10 Acute tubulo-interstitial nephritis: Secondary | ICD-10-CM | POA: Insufficient documentation

## 2019-07-30 DIAGNOSIS — R1032 Left lower quadrant pain: Secondary | ICD-10-CM | POA: Diagnosis present

## 2019-07-30 DIAGNOSIS — Z7982 Long term (current) use of aspirin: Secondary | ICD-10-CM | POA: Insufficient documentation

## 2019-07-30 DIAGNOSIS — I10 Essential (primary) hypertension: Secondary | ICD-10-CM | POA: Insufficient documentation

## 2019-07-30 LAB — COMPREHENSIVE METABOLIC PANEL
ALT: 16 U/L (ref 0–44)
AST: 17 U/L (ref 15–41)
Albumin: 3.9 g/dL (ref 3.5–5.0)
Alkaline Phosphatase: 43 U/L (ref 38–126)
Anion gap: 9 (ref 5–15)
BUN: 14 mg/dL (ref 6–20)
CO2: 26 mmol/L (ref 22–32)
Calcium: 9.2 mg/dL (ref 8.9–10.3)
Chloride: 101 mmol/L (ref 98–111)
Creatinine, Ser: 0.71 mg/dL (ref 0.44–1.00)
GFR calc Af Amer: >60 mL/min (ref >60)
GFR calc non Af Amer: >60 mL/min (ref >60)
Glucose, Bld: 125 mg/dL — ABNORMAL HIGH (ref 70–99)
Potassium: 3.5 mmol/L (ref 3.5–5.1)
Sodium: 136 mmol/L (ref 135–145)
Total Bilirubin: 0.3 mg/dL (ref 0.3–1.2)
Total Protein: 7.2 g/dL (ref 6.5–8.1)

## 2019-07-30 LAB — URINALYSIS, ROUTINE W REFLEX MICROSCOPIC
Bilirubin Urine: NEGATIVE
Glucose, UA: NEGATIVE mg/dL
Hgb urine dipstick: NEGATIVE
Ketones, ur: NEGATIVE mg/dL
Nitrite: NEGATIVE
Protein, ur: NEGATIVE mg/dL
Specific Gravity, Urine: 1.01 (ref 1.005–1.030)
pH: 7.5 (ref 5.0–8.0)

## 2019-07-30 LAB — CBC WITH DIFFERENTIAL/PLATELET
Abs Immature Granulocytes: 0.01 10*3/uL (ref 0.00–0.07)
Basophils Absolute: 0 10*3/uL (ref 0.0–0.1)
Basophils Relative: 1 %
Eosinophils Absolute: 0.1 10*3/uL (ref 0.0–0.5)
Eosinophils Relative: 3 %
HCT: 38.7 % (ref 36.0–46.0)
Hemoglobin: 13.1 g/dL (ref 12.0–15.0)
Immature Granulocytes: 0 %
Lymphocytes Relative: 19 %
Lymphs Abs: 1 10*3/uL (ref 0.7–4.0)
MCH: 33.2 pg (ref 26.0–34.0)
MCHC: 33.9 g/dL (ref 30.0–36.0)
MCV: 98 fL (ref 80.0–100.0)
Monocytes Absolute: 0.6 10*3/uL (ref 0.1–1.0)
Monocytes Relative: 11 %
Neutro Abs: 3.6 10*3/uL (ref 1.7–7.7)
Neutrophils Relative %: 66 %
Platelets: 251 10*3/uL (ref 150–400)
RBC: 3.95 MIL/uL (ref 3.87–5.11)
RDW: 12.3 % (ref 11.5–15.5)
WBC: 5.4 10*3/uL (ref 4.0–10.5)
nRBC: 0 % (ref 0.0–0.2)

## 2019-07-30 LAB — URINALYSIS, MICROSCOPIC (REFLEX)

## 2019-07-30 MED ORDER — ONDANSETRON HCL 4 MG/2ML IJ SOLN
4.0000 mg | Freq: Once | INTRAMUSCULAR | Status: AC
  Administered 2019-07-30: 4 mg via INTRAVENOUS
  Filled 2019-07-30: qty 2

## 2019-07-30 MED ORDER — SODIUM CHLORIDE 0.9 % IV BOLUS
1000.0000 mL | Freq: Once | INTRAVENOUS | Status: AC
  Administered 2019-07-30: 1000 mL via INTRAVENOUS

## 2019-07-30 MED ORDER — ONDANSETRON 4 MG PO TBDP: 4.0000 mg | ORAL_TABLET | Freq: Three times a day (TID) | ORAL | 0 refills | Status: DC | PRN

## 2019-07-30 MED ORDER — IOHEXOL 300 MG/ML  SOLN
100.0000 mL | Freq: Once | INTRAMUSCULAR | Status: AC | PRN
  Administered 2019-07-30: 100 mL via INTRAVENOUS

## 2019-07-30 MED FILL — ONDANSETRON ODT 4 MG TABLET: 4 | 21 days supply | Qty: 18 | Fill #0

## 2019-07-30 NOTE — Discharge Instructions (Addendum)
Pyelonephritis  There is evidence of an infection in the kidney called pyelonephritis.  Pain/Fever:  Antiinflammatory medications: Take 600 mg of ibuprofen every 6 hours or 440 mg (over the counter dose) to 500 mg (prescription dose) of naproxen every 12 hours for the next 3 days. After this time, these medications may be used as needed for pain. Take these medications with food to avoid upset stomach. Choose only one of these medications, do not take them together. Acetaminophen (generic for Tylenol): Should you continue to have additional pain while taking the ibuprofen or naproxen, you may add in acetaminophen as needed. Your daily total maximum amount of acetaminophen from all sources should be limited to 4000mg /day for persons without liver problems, or 2000mg /day for those with liver problems.  Nausea/vomiting: Use the ondansetron (generic for Zofran) for nausea or vomiting.  This medication may not prevent all vomiting or nausea, but can help facilitate better hydration. Things that can help with nausea/vomiting also include peppermint/menthol candies, vitamin B12, and ginger.  Antibiotics: Please take all of your antibiotics until finished!   You may develop abdominal discomfort or diarrhea from the antibiotic.  You may help offset this with probiotics which you can buy or get in yogurt. Do not eat or take the probiotics until 2 hours after your antibiotic.   Hydration: Symptoms of any other illness will be intensified and complicated by dehydration. Dehydration can also extend the duration of symptoms. Dehydration typically causes its own symptoms including lightheadedness, nausea, headaches, fatigue increased thirst, and generally feeling unwell. Drink plenty of fluids and get plenty of rest. You should be drinking at least half a liter of water every hour or two to stay hydrated. Electrolyte drinks (ex. Gatorade, Powerade, Pedialyte) are also encouraged. You should be drinking enough fluids  to make your urine light yellow, almost clear. If this is not the case, you are not drinking enough water.  Follow-up: Most instances of pyelonephritis may be followed up upon by a primary care provider.  Should symptoms fail to begin to resolve after a few days or they fail to resolve by the end of the antibiotic course, follow-up with a urologist. It is also recommended that you have repeat CT or MRI with and without IV contrast in 3 months to assure kidneys appear normal. Return: Return to the emergency department for significantly worsening pain, inability or significant difficulty urinating, uncontrolled vomiting, spreading pain, or any other major concerns.

## 2019-07-30 NOTE — ED Notes (Signed)
Pt finished drinking oral contrast drink.

## 2019-07-30 NOTE — ED Triage Notes (Addendum)
Pt reports left sided flank pain since Monday, prescribed antibiotic by pmd, taking as directed.  Denies burning with urination, does report fever earlier in week, not currently.  Took tylenol this morning.  Has some improvement, but pain remains, and nausea remains.

## 2019-07-30 NOTE — ED Provider Notes (Signed)
[facility] EMERGENCY DEPARTMENT Provider Note   CSN: [id number] Arrival date & time: 07/30/19  [id number]     History Chief Complaint  Patient presents with  . Flank Pain    Tamara Charles is a 59 y.o. female.  HPI      Tamara Charles is a 59 y.o. female, with a history of HTN, UTI, presenting to the ED with left lower back pain for about the last 3 days.  Pain is described as a soreness and tightness, initially moderate to severe, currently mild to moderate, radiating into the left side of the abdomen, constant.  She endorses fever with T-max 101 F for the first couple days, but none today.  Accompanied by nausea. States she has had similar symptoms with kidney infection in the past. She made contact with her PCPs office February 22, they were unable to see her, but they called in 10 days of Ceftin, with which she has been compliant.  She states her symptoms have improved since then. She also had symptoms of sinus infection with nasal congestion, green nasal discharge, and sinus pain, but these symptoms have resolved.   Denies dysuria, hematuria, chest pain, shortness of breath, cough, hematochezia/melena, numbness, weakness, change in bowel/bladder function, or any other complaints.    Past Medical History:  Diagnosis Date  . Anxiety   . Hypertension   . Spontaneous pneumothorax    at 59 yo  . UTI (urinary tract infection)     Patient Active Problem List   Diagnosis Date Noted  . Paresthesia 07/09/2019  . Hyperglycemia 07/09/2019  . Foot pain, bilateral 07/09/2019  . Vitamin D deficiency 07/09/2019  . UTI (urinary tract infection) 05/05/2019  . Nausea 01/02/2019  . 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 Left flank pain 06/26/2018  . Acute bronchitis 06/26/2018  . Lower abdominal pain 06/26/2018  . Diverticulitis 01/28/2018  . Diarrhea 01/28/2018  . Rash and nonspecific skin eruption 10/23/2017  . Hot flash not due to menopause 06/26/2016  . Herpes simplex  04/20/2015  . Weight gain 08/25/2014  . Well adult exam 02/23/2014  . Bleb, lung ([place]) 02/23/2014  . Family history of early CAD 02/23/2014  . Tobacco use disorder 02/23/2014  . Hypertension 04/21/2013  . Spontaneous pneumothorax 04/21/2013    Past Surgical History:  Procedure Laterality Date  . ABDOMINAL HYSTERECTOMY     partial  . BREAST SURGERY Bilateral    breast reduction  . LUNG SURGERY Bilateral 1997  . REDUCTION MAMMAPLASTY       OB History   No obstetric history on file.     Family History  Problem Relation Age of Onset  . Heart disease Mother   . Hypertension Mother   . Heart disease Brother   . Breast cancer Maternal Aunt   . Bladder Cancer Maternal Grandmother     Social History   Tobacco Use  . Smoking status: Current Every Day Smoker    Packs/day: 0.50    Types: Cigarettes  . Smokeless tobacco: Never Used  Substance Use Topics  . Alcohol use: Yes    Comment: occ  . Drug use: Yes    Frequency: 7.0 times per week    Types: Marijuana    Comment: daily     Home Medications Prior to Admission medications   Medication Sig Start Date End Date Taking? Authorizing Provider  aspirin EC 81 MG tablet Take 1 tablet (81 mg total) by mouth daily. 02/23/14   Plotnikov, Evie Lacks, MD  b complex vitamins  tablet Take 1 tablet by mouth daily. 07/09/19   Plotnikov, Evie Lacks, MD  calcium carbonate (ANTACID) 500 MG chewable tablet Chew 1 tablet by mouth 3 (three) times daily.    [provider]  cefUROXime (CEFTIN) 250 MG tablet Take 1 tablet (250 mg total) by mouth 2 (two) times daily with a meal for 10 days. 07/28/19 08/07/19  Marrian Salvage, FNP  Cholecalciferol (VITAMIN D3) 50 MCG (2000 UT) capsule Take 1 capsule (2,000 Units total) by mouth daily. 07/09/19   Plotnikov, Evie Lacks, MD  gabapentin (NEURONTIN) 100 MG capsule Take 1-2 capsules (100-200 mg total) by mouth 3 (three) times daily as needed. 07/09/19   Plotnikov, Evie Lacks, MD  meloxicam (MOBIC)  15 MG tablet Take 1 tablet (15 mg total) by mouth daily. 07/09/19   Plotnikov, Evie Lacks, MD  naproxen sodium (ANAPROX) 220 MG tablet Take 220 mg by mouth 2 (two) times daily with a meal.    [provider]  nortriptyline (PAMELOR) 10 MG capsule TAKE 1 CAPSULE (10 MG TOTAL) BY MOUTH AT BEDTIME. 01/05/19   Plotnikov, Evie Lacks, MD  ondansetron (ZOFRAN ODT) 4 MG disintegrating tablet Take 1 tablet (4 mg total) by mouth every 8 (eight) hours as needed for nausea or vomiting. 07/30/19   Sameka Bagent C, PA-C  polyethylene glycol-electrolytes (NULYTELY/GOLYTELY) 420 g solution Take 4,000 mLs by mouth as directed. 02/26/19   Milus Banister, MD  Probiotic Product (ALIGN) 4 MG CAPS Take 1 capsule (4 mg total) by mouth daily. 01/02/19   Plotnikov, Evie Lacks, MD  psyllium (METAMUCIL) 58.6 % packet Take 1 packet by mouth every other day.    [provider]  triamterene-hydrochlorothiazide (MAXZIDE-25) 37.5-25 MG tablet TAKE 1 TABLET BY MOUTH EVERY DAY 06/07/19   Plotnikov, Evie Lacks, MD    Allergies    Augmentin [amoxicillin-pot clavulanate], Ciprofloxacin, and Flagyl [metronidazole]  Review of Systems   Review of Systems  Constitutional: Positive for fever.  Respiratory: Negative for cough and shortness of breath.   Cardiovascular: Negative for chest pain and leg swelling.  Gastrointestinal: Positive for abdominal pain and nausea. Negative for blood in stool, constipation, diarrhea and vomiting.  Genitourinary: Positive for flank pain and frequency. Negative for dysuria, hematuria, vaginal bleeding and vaginal discharge.  Musculoskeletal: Positive for back pain.  Neurological: Negative for weakness and numbness.  All other systems reviewed and are negative.   Physical Exam Updated Vital Signs BP (!) 147/87 (BP Location: Right Arm)   Pulse 82   Temp 98.8 F (37.1 C) (Oral)   Resp 18   Ht 5\' 7"  (1.702 m)   Wt 81 kg   LMP 06/05/2008   SpO2 97%   BMI 27.97 kg/m   Physical  Exam Vitals and nursing note reviewed.  Constitutional:      General: She is not in acute distress.    Appearance: She is well-developed. She is not diaphoretic.  HENT:     Head: Normocephalic and atraumatic.     Mouth/Throat:     Mouth: Mucous membranes are moist.     Pharynx: Oropharynx is clear.  Eyes:     Conjunctiva/sclera: Conjunctivae normal.  Cardiovascular:     Rate and Rhythm: Normal rate and regular rhythm.     Pulses: Normal pulses.          Radial pulses are 2+ on the right side and 2+ on the left side.       Posterior tibial pulses are 2+ on the right side  and 2+ on the left side.     Heart sounds: Normal heart sounds.     Comments: Tactile temperature in the extremities appropriate and equal bilaterally. Pulmonary:     Effort: Pulmonary effort is normal. No respiratory distress.     Breath sounds: Normal breath sounds.  Abdominal:     Palpations: Abdomen is soft.     Tenderness: There is abdominal tenderness. There is no right CVA tenderness, left CVA tenderness or guarding.    Musculoskeletal:     Cervical back: Neck supple.       Back:     Right lower leg: No edema.     Left lower leg: No edema.  Lymphadenopathy:     Cervical: No cervical adenopathy.  Skin:    General: Skin is warm and dry.  Neurological:     Mental Status: She is alert.     Comments: Sensation grossly intact to light touch in the lower extremities bilaterally. No saddle anesthesias. Strength 5/5 in the bilateral lower extremities. No noted gait deficit. Coordination intact.  Psychiatric:        Mood and Affect: Mood and affect normal.        Speech: Speech normal.        Behavior: Behavior normal.     ED Results / Procedures / Treatments   Labs (all labs ordered are listed, but only abnormal results are displayed) Labs Reviewed  COMPREHENSIVE METABOLIC PANEL - Abnormal; Notable for the following components:      Result Value   Glucose, Bld 125 (*)    All other components  within normal limits  URINALYSIS, ROUTINE W REFLEX MICROSCOPIC - Abnormal; Notable for the following components:   Leukocytes,Ua TRACE (*)    All other components within normal limits  URINALYSIS, MICROSCOPIC (REFLEX) - Abnormal; Notable for the following components:   Bacteria, UA FEW (*)    All other components within normal limits  URINE CULTURE  CBC WITH DIFFERENTIAL/PLATELET    EKG None  Radiology CT ABDOMEN PELVIS W CONTRAST  Result Date: 07/30/2019 CLINICAL DATA:  Left flank/back pain for several days on antibiotic therapy. EXAM: CT ABDOMEN AND PELVIS WITH CONTRAST TECHNIQUE: Multidetector CT imaging of the abdomen and pelvis was performed using the standard protocol following bolus administration of intravenous contrast. CONTRAST:  154mL OMNIPAQUE IOHEXOL 300 MG/ML  SOLN COMPARISON:  02/04/2018 CT abdomen/pelvis. FINDINGS: Lower chest: No significant pulmonary nodules or acute consolidative airspace disease. Hepatobiliary: Normal liver size. No liver mass. Normal gallbladder with no radiopaque cholelithiasis. No biliary ductal dilatation. Pancreas: Normal, with no mass or duct dilation. Spleen: Normal size. No mass. Adrenals/Urinary Tract: Normal adrenals. There are multiple new hypodense renal cortical foci in the left kidney, largest somewhat wedge-shaped and measuring 2.3 x 1.9 cm in the lower left kidney (series 7/image 24), not seen on 02/04/2018 CT. No right renal lesions. No hydronephrosis. Normal bladder. Stomach/Bowel: Normal non-distended stomach. Normal caliber small bowel with no small bowel wall thickening. Normal appendix. Mild left colonic diverticulosis, with no large bowel wall thickening or significant pericolonic fat stranding. Vascular/Lymphatic: Atherosclerotic nonaneurysmal abdominal aorta. Patent portal, splenic, hepatic and renal veins. Retroaortic left renal vein. No pathologically enlarged lymph nodes in the abdomen or pelvis. Reproductive: Status post hysterectomy,  with no abnormal findings at the vaginal cuff. No adnexal mass. Other: No pneumoperitoneum, ascites or focal fluid collection. Musculoskeletal: No aggressive appearing focal osseous lesions. Moderate thoracolumbar spondylosis. IMPRESSION: 1. Multiple indistinct hypodense renal cortical foci in the left kidney, as  detailed, not seen on 02/04/2018 CT. Favor acute left pyelonephritis. No evidence of renal abscess. Follow-up post treatment MRI or CT abdomen without and with IV contrast is recommended in 3 months. 2. Mild left colonic diverticulosis. 3. Aortic Atherosclerosis (ICD10-I70.0). Electronically Signed   By: Ilona Sorrel M.D.   On: 07/30/2019 12:18    Procedures Procedures (including critical care time)  Medications Ordered in ED Medications  sodium chloride 0.9 % bolus 1,000 mL (0 mLs Intravenous Stopped 07/30/19 1158)  ondansetron (ZOFRAN) injection 4 mg (4 mg Intravenous Given 07/30/19 1053)  iohexol (OMNIPAQUE) 300 MG/ML solution 100 mL (100 mLs Intravenous Contrast Given 07/30/19 1138)    ED Course  I have reviewed the triage vital signs and the nursing notes.  Pertinent labs & imaging results that were available during my care of the patient were reviewed by me and considered in my medical decision making (see chart for details).  Clinical Course as of Jul 29 1402  Wed Jul 30, 2019  1031 Declines analgesia at this time.   [SJ]    Clinical Course User Index [SJ] Deidre Carino, Helane Gunther, PA-C   MDM Rules/Calculators/A&P                      Patient presents with left lower back and left flank pain. Patient is nontoxic appearing, afebrile, not tachycardic, not tachypneic, not hypotensive, maintains excellent SPO2 on room air, and is in no apparent distress.   I have reviewed the patient's chart to obtain more information.  I noted to her visit with her PCP where she was prescribed her antibiotic.  I reviewed and interpreted the patient's labs and radiological studies. No leukocytosis.   Few bacteria and trace leukocytes on UA.  Culture pending. CT with findings that may suggest pyelonephritis.  I do think this fits with the patient's symptoms. We discussed the radiologist recommendation for repeat imaging in 3 months.  This information was included on her discharge paperwork, in addition patient was provided with a copy of her CT read.  We discussed PCP versus urology follow-up. The patient was given instructions for home care as well as return precautions. Patient voices understanding of these instructions, accepts the plan, and is comfortable with discharge.   Vitals:   07/30/19 0959 07/30/19 1001 07/30/19 1155 07/30/19 1359  BP: (!) 147/87  128/69 125/85  Pulse: 82  60 78  Resp: 18  16 16   Temp: 98.8 F (37.1 C)     TempSrc: Oral     SpO2: 97%  100% 100%  Weight:  81 kg    Height:  5\' 7"  (1.702 m)       Final Clinical Impression(s) / ED Diagnoses Final diagnoses:  Pyelonephritis    Rx / DC Orders ED Discharge Orders         Ordered    ondansetron (ZOFRAN ODT) 4 MG disintegrating tablet  Every 8 hours PRN     07/30/19 1346           Lahela Woodin, Helane Gunther, PA-C 07/30/19 [place], [place], DO 07/30/19 1424

## 2019-07-30 NOTE — ED Notes (Signed)
To bathroom to provide urine specimen

## 2019-08-01 LAB — URINE CULTURE: Culture: >=100000 — AB

## 2019-08-03 ENCOUNTER — Telehealth: Payer: Self-pay | Admitting: Emergency Medicine

## 2019-08-03 NOTE — Telephone Encounter (Signed)
Post ED Visit - Positive Culture Follow-up: Successful Patient Follow-Up  Culture assessed and recommendations reviewed by:  []  Elenor Quinones, Pharm.D. []  Heide Guile, Pharm.D., BCPS AQ-ID []  Parks Neptune, Pharm.D., BCPS []  Alycia Rossetti, Pharm.D., BCPS []  [place], Pharm.D., BCPS, AAHIVP []  Legrand Como, Pharm.D., BCPS, AAHIVP []  Salome Arnt, PharmD, BCPS []  Johnnette Gourd, PharmD, BCPS []  Hughes Better, PharmD, BCPS [x]  Duanne Limerick, PharmD  Positive urine culture  []  Patient discharged without antimicrobial prescription and treatment is now indicated [x]  Organism is resistant to prescribed ED discharge antimicrobial []  Patient with positive blood cultures  Changes discussed with ED provider:Lindsey Layden PA New antibiotic prescription Bactrim DS one tab PO BID x ten days [facility] [phone]  Contacted patient, date 08/03/2019, time [place] 08/03/2019, 1:37 PM

## 2019-09-18 ENCOUNTER — Other Ambulatory Visit: Payer: Self-pay | Admitting: Internal Medicine

## 2019-11-16 ENCOUNTER — Other Ambulatory Visit: Payer: Self-pay | Admitting: Internal Medicine

## 2019-11-17 ENCOUNTER — Encounter: Payer: Self-pay | Admitting: Family

## 2019-11-17 ENCOUNTER — Ambulatory Visit (INDEPENDENT_AMBULATORY_CARE_PROVIDER_SITE_OTHER): Payer: 59 | Admitting: Family

## 2019-11-17 ENCOUNTER — Other Ambulatory Visit: Payer: Self-pay

## 2019-11-17 VITALS — BP 126/74 | HR 79 | Temp 98.5°F | Ht 67.0 in | Wt 178.4 lb

## 2019-11-17 DIAGNOSIS — N390 Urinary tract infection, site not specified: Secondary | ICD-10-CM

## 2019-11-17 DIAGNOSIS — R109 Unspecified abdominal pain: Secondary | ICD-10-CM

## 2019-11-17 LAB — COMPREHENSIVE METABOLIC PANEL
ALT: 16 U/L (ref 0–35)
AST: 18 U/L (ref 0–37)
Albumin: 4.6 g/dL (ref 3.5–5.2)
Alkaline Phosphatase: 47 U/L (ref 39–117)
BUN: 16 mg/dL (ref 6–23)
CO2: 29 mEq/L (ref 19–32)
Calcium: 9.7 mg/dL (ref 8.4–10.5)
Chloride: 101 mEq/L (ref 96–112)
Creatinine, Ser: 0.71 mg/dL (ref 0.40–1.20)
GFR: 84.73 mL/min (ref >60.00)
Glucose, Bld: 106 mg/dL — ABNORMAL HIGH (ref 70–99)
Potassium: 3.6 mEq/L (ref 3.5–5.1)
Sodium: 137 mEq/L (ref 135–145)
Total Bilirubin: 0.3 mg/dL (ref 0.2–1.2)
Total Protein: 7 g/dL (ref 6.0–8.3)

## 2019-11-17 LAB — POCT URINALYSIS DIPSTICK
Bilirubin, UA: NEGATIVE
Blood, UA: NEGATIVE
Glucose, UA: NEGATIVE
Ketones, UA: NEGATIVE
Leukocytes, UA: NEGATIVE
Nitrite, UA: NEGATIVE
Protein, UA: NEGATIVE
Spec Grav, UA: 1.015 (ref 1.010–1.025)
Urobilinogen, UA: 0.2 E.U./dL
pH, UA: 6.5 (ref 5.0–8.0)

## 2019-11-17 MED ORDER — SULFAMETHOXAZOLE-TRIMETHOPRIM 800-160 MG PO TABS: 1.0000 | ORAL_TABLET | Freq: Two times a day (BID) | ORAL | 0 refills | Status: DC

## 2019-11-17 NOTE — Progress Notes (Signed)
Tamara Charles is a 59 y.o. female with the following history as recorded in EpicCare:  Patient Active Problem List   Diagnosis Date Noted  . Paresthesia 07/09/2019  . Hyperglycemia 07/09/2019  . Foot pain, bilateral 07/09/2019  . Vitamin D deficiency 07/09/2019  . UTI (urinary tract infection) 05/05/2019  . Nausea 01/02/2019  . 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 Left flank pain 06/26/2018  . Acute bronchitis 06/26/2018  . Lower abdominal pain 06/26/2018  . Diverticulitis 01/28/2018  . Diarrhea 01/28/2018  . Rash and nonspecific skin eruption 10/23/2017  . Hot flash not due to menopause 06/26/2016  . Herpes simplex 04/20/2015  . Weight gain 08/25/2014  . Well adult exam 02/23/2014  . Bleb, lung ([place]) 02/23/2014  . Family history of early CAD 02/23/2014  . Tobacco use disorder 02/23/2014  . Hypertension 04/21/2013  . Spontaneous pneumothorax 04/21/2013    Current Outpatient Medications  Medication Sig Dispense Refill  . aspirin EC 81 MG tablet Take 1 tablet (81 mg total) by mouth daily. 100 tablet 3  . b complex vitamins tablet Take 1 tablet by mouth daily. 100 tablet 3  . calcium carbonate (ANTACID) 500 MG chewable tablet Chew 1 tablet by mouth 3 (three) times daily.    . Cholecalciferol (VITAMIN D3) 50 MCG (2000 UT) capsule Take 1 capsule (2,000 Units total) by mouth daily. 100 capsule 3  . gabapentin (NEURONTIN) 100 MG capsule Take 1-2 capsules (100-200 mg total) by mouth 3 (three) times daily as needed. 90 capsule 3  . meloxicam (MOBIC) 15 MG tablet TAKE 1 TABLET BY MOUTH EVERY DAY 30 tablet 1  . naproxen sodium (ANAPROX) 220 MG tablet Take 220 mg by mouth 2 (two) times daily with a meal.    . nortriptyline (PAMELOR) 10 MG capsule TAKE 1 CAPSULE (10 MG TOTAL) BY MOUTH AT BEDTIME. 90 capsule 2  . ondansetron (ZOFRAN ODT) 4 MG disintegrating tablet Take 1 tablet (4 mg total) by mouth every 8 (eight) hours as needed for nausea or vomiting. 20 tablet 0  . polyethylene  glycol-electrolytes (NULYTELY/GOLYTELY) 420 g solution Take 4,000 mLs by mouth as directed. 4000 mL 0  . Probiotic Product (ALIGN) 4 MG CAPS Take 1 capsule (4 mg total) by mouth daily. 30 capsule 1  . psyllium (METAMUCIL) 58.6 % packet Take 1 packet by mouth every other day.    . triamterene-hydrochlorothiazide (MAXZIDE-25) 37.5-25 MG tablet TAKE 1 TABLET BY MOUTH EVERY DAY 90 tablet 3  . valACYclovir (VALTREX) 500 MG tablet Take 500 mg by mouth at bedtime.    . sulfamethoxazole-trimethoprim (BACTRIM DS) 800-160 MG tablet Take 1 tablet by mouth 2 (two) times daily. 10 tablet 0   No current facility-administered medications for this visit.    Allergies: Augmentin [amoxicillin-pot clavulanate], Ciprofloxacin, and Flagyl [metronidazole]  Past Medical History:  Diagnosis Date  . Anxiety   . Hypertension   . Spontaneous pneumothorax    at 59 yo  . UTI (urinary tract infection)     Past Surgical History:  Procedure Laterality Date  . ABDOMINAL HYSTERECTOMY     partial  . BREAST SURGERY Bilateral    breast reduction  . LUNG SURGERY Bilateral 1997  . REDUCTION MAMMAPLASTY      Family History  Problem Relation Age of Onset  . Heart disease Mother   . Hypertension Mother   . Heart disease Brother   . Breast cancer Maternal Aunt   . Bladder Cancer Maternal Grandmother     Social History   Tobacco Use  .  Smoking status: Current Every Day Smoker    Packs/day: 0.50    Types: Cigarettes  . Smokeless tobacco: Never Used  Substance Use Topics  . Alcohol use: Yes    Comment: occ    Subjective:  Concern for UTI; was treated for similar symptoms in 07/2019; ended up in ER with pyelonephritis; CT done at that time showed some abnormalities and 3 month repeat testing was recommended; with this particular episode, does not feel like she is emptying bladder completely;   Objective:  Vitals:   11/17/19 1358  BP: 126/74  Pulse: 79  Temp: 98.5 F (36.9 C)  TempSrc: Oral  SpO2: 96%   Weight: 178 lb 6.4 oz (80.9 kg)  Height: 5' 7"  (1.702 m)    General: Well developed, well nourished, in no acute distress  Skin : Warm and dry.  Head: Normocephalic and atraumatic  Lungs: Respirations unlabored; clear to auscultation bilaterally without wheeze, rales, rhonchi  [facility] exam: normal rate and regular rhythm.  Musculoskeletal: No deformities; no active joint inflammation  Extremities: No edema, cyanosis, clubbing  Vessels: Symmetric bilaterally  Neurologic: Alert and oriented; speech intact; face symmetrical; moves all extremities well; CNII-XII intact without focal deficit  Assessment:  1. Urinary tract infection without hematuria, site unspecified   2. Abdominal pain, unspecified abdominal location     Plan:  Update U/A and urine culture today; Rx for Bactrim DS bid x 5 days; will update CT with and without contrast as recommended by radiologist for 3 month follow-up;  Follow-up to be determined.  This visit occurred during the SARS-CoV-2 public health emergency.  Safety protocols were in place, including screening questions prior to the visit, additional usage of staff PPE, and extensive cleaning of exam room while observing appropriate contact time as indicated for disinfecting solutions.     No follow-ups on file.  Orders Placed This Encounter  Procedures  . Urine Culture  . CT ABDOMEN PELVIS W WO CONTRAST    This exam should ONLY be ordered for initial diagnosis or follow up of known pancreatic/liver/renal/bladder masses.    Standing Status:   Future    Standing Expiration Date:   11/16/2020    Order Specific Question:   ** REASON FOR EXAM (FREE TEXT)    Answer:   abnormal CT/ 3 month follow-up    Order Specific Question:   If indicated for the ordered procedure, I authorize the administration of contrast media per Radiology protocol    Answer:   Yes    Order Specific Question:   Is patient pregnant?    Answer:   No    Order Specific Question:   Preferred imaging  location?    Answer:   [address]    Order Specific Question:   Is Oral Contrast requested for this exam?    Answer:   Yes, Per Radiology protocol    Order Specific Question:   Radiology Contrast Protocol - do NOT remove file path    Answer:   [email or web address]  . Comp Met (CMET)  . POCT Urinalysis Dipstick    Requested Prescriptions   Signed Prescriptions Disp Refills  . sulfamethoxazole-trimethoprim (BACTRIM DS) 800-160 MG tablet 10 tablet 0    Sig: Take 1 tablet by mouth 2 (two) times daily.

## 2019-11-18 LAB — URINE CULTURE: Result:: NO GROWTH

## 2019-11-20 ENCOUNTER — Telehealth: Payer: Self-pay | Admitting: Family

## 2019-11-20 NOTE — Telephone Encounter (Signed)
Called pt back to see what type of symptoms she is still having. Pt states she is still having pain (L) in lower back, and she is nauseated. She states she still has 1 day left of the Bactrim that was rx. She denies any fever, and she is schedule for her CT next Wednesday 23rd. Tamara Charles is out of the office pls advise on any recommendations concerning symptoms.Marland KitchenJohny Chess

## 2019-11-20 NOTE — Telephone Encounter (Signed)
Patient had an appointment on 6/14 and was treated for a UTI. Patient states she has been taking the abx but feels no better in fact she feels worse. She would like to know if there is anything else that can be done.

## 2019-11-20 NOTE — Telephone Encounter (Signed)
Urine culture was not remarkable; please get the CT done as ordered.

## 2019-11-21 NOTE — Telephone Encounter (Signed)
Noted.  Agree with the plan.  Go to ER or urgent care worse.  Thanks

## 2019-11-23 ENCOUNTER — Other Ambulatory Visit: Payer: Self-pay | Admitting: Internal Medicine

## 2019-11-24 NOTE — Telephone Encounter (Signed)
Spoke with patient today and she states she feels much better today after stopping abx. She knows to get the CT on Wednesday and we will be in touch with results to advise.

## 2019-11-26 ENCOUNTER — Other Ambulatory Visit: Payer: Self-pay | Admitting: Family

## 2019-11-26 ENCOUNTER — Encounter: Payer: Self-pay | Admitting: Family

## 2019-11-26 ENCOUNTER — Other Ambulatory Visit: Payer: Self-pay

## 2019-11-26 ENCOUNTER — Ambulatory Visit (INDEPENDENT_AMBULATORY_CARE_PROVIDER_SITE_OTHER)
Admission: RE | Admit: 2019-11-26 | Discharge: 2019-11-26 | Disposition: A | Discharge status: Home / Self Care | Payer: 59 | Source: Ambulatory Visit | Attending: Family | Admitting: Family

## 2019-11-26 DIAGNOSIS — R109 Unspecified abdominal pain: Secondary | ICD-10-CM

## 2019-11-26 MED ORDER — IOHEXOL 300 MG/ML  SOLN
100.0000 mL | Freq: Once | INTRAMUSCULAR | Status: AC | PRN
  Administered 2019-11-26: 100 mL via INTRAVENOUS

## 2019-12-18 ENCOUNTER — Telehealth: Payer: Self-pay

## 2019-12-18 MED ORDER — VALACYCLOVIR HCL 500 MG PO TABS: 500.0000 mg | ORAL_TABLET | Freq: Every day | ORAL | 0 refills | Status: DC

## 2019-12-18 NOTE — Telephone Encounter (Signed)
Patient states she has medication remaining, requesting refill today, out of town vacation

## 2019-12-18 NOTE — Telephone Encounter (Signed)
1.Medication Requested:valACYclovir (VALTREX) 500 MG tablet  2. Pharmacy ([facility] [place] [address] [facility] [place] fax # [phone] - phone # [phone]   3. On Med List: Yes   4. Last Visit with PCP: 6.14.21   5. Next visit date with PCP: n/a    Agent: Please be advised that RX refills may take up to 3 business days. We ask that you follow-up with your pharmacy.

## 2019-12-18 NOTE — Telephone Encounter (Signed)
See med Rx

## 2020-01-14 ENCOUNTER — Other Ambulatory Visit: Payer: Self-pay | Admitting: Internal Medicine

## 2020-03-01 ENCOUNTER — Telehealth: Payer: Self-pay | Admitting: Internal Medicine

## 2020-03-01 MED ORDER — VALACYCLOVIR HCL 500 MG PO TABS: 500.0000 mg | ORAL_TABLET | Freq: Every day | ORAL | 0 refills | Status: DC

## 2020-03-01 NOTE — Telephone Encounter (Signed)
See 03/01/20 med refill 

## 2020-03-01 NOTE — Telephone Encounter (Signed)
valACYclovir (VALTREX) 500 MG tablet  [id number] [address], [place] Phone:  [phone]  Fax:  [phone]     Full prescription   Last visit: 6.14.21 Next visit: Not scheduled

## 2020-03-19 ENCOUNTER — Other Ambulatory Visit: Payer: Self-pay | Admitting: Internal Medicine

## 2020-04-05 ENCOUNTER — Telehealth: Payer: Self-pay | Admitting: Internal Medicine

## 2020-04-05 DIAGNOSIS — R300 Dysuria: Secondary | ICD-10-CM

## 2020-04-05 NOTE — Telephone Encounter (Signed)
Patient believes she has a kidney infection and was wondering if she could go get a uranalysis done.

## 2020-04-06 ENCOUNTER — Other Ambulatory Visit (INDEPENDENT_AMBULATORY_CARE_PROVIDER_SITE_OTHER): Payer: 59

## 2020-04-06 DIAGNOSIS — R300 Dysuria: Secondary | ICD-10-CM

## 2020-04-06 LAB — URINALYSIS, ROUTINE W REFLEX MICROSCOPIC
Bilirubin Urine: NEGATIVE
Hgb urine dipstick: NEGATIVE
Ketones, ur: NEGATIVE
Leukocytes,Ua: NEGATIVE
Nitrite: NEGATIVE
RBC / HPF: NONE SEEN (ref 0–?)
Specific Gravity, Urine: 1.01 (ref 1.000–1.030)
Total Protein, Urine: NEGATIVE
Urine Glucose: NEGATIVE
Urobilinogen, UA: 0.2 (ref 0.0–1.0)
pH: 7.5 (ref 5.0–8.0)

## 2020-04-06 NOTE — Telephone Encounter (Signed)
Okay UA and culture.  Thanks 

## 2020-04-06 NOTE — Telephone Encounter (Signed)
Called pt no answer LMOM MD ok UA order has been placed. Can go to [facility] to have done.Marland KitchenJohny Chess

## 2020-04-06 NOTE — Addendum Note (Signed)
Addended by: Earnstine Regal on: 04/06/2020 11:16 AM   Modules accepted: Orders

## 2020-04-07 ENCOUNTER — Other Ambulatory Visit: Payer: Self-pay | Admitting: Internal Medicine

## 2020-04-07 MED ORDER — CEFUROXIME AXETIL 250 MG PO TABS: 250.0000 mg | ORAL_TABLET | Freq: Two times a day (BID) | ORAL | 0 refills | Status: DC

## 2020-04-08 LAB — URINE CULTURE
MICRO NUMBER:: 11148723
SPECIMEN QUALITY:: ADEQUATE

## 2020-04-10 ENCOUNTER — Other Ambulatory Visit: Payer: Self-pay | Admitting: Internal Medicine

## 2020-04-10 MED ORDER — NITROFURANTOIN MONOHYD MACRO 100 MG PO CAPS: 100.0000 mg | ORAL_CAPSULE | Freq: Two times a day (BID) | ORAL | 0 refills | Status: DC

## 2020-04-14 ENCOUNTER — Telehealth: Payer: Self-pay | Admitting: Internal Medicine

## 2020-04-14 MED ORDER — NITROFURANTOIN MONOHYD MACRO 100 MG PO CAPS: 100.0000 mg | ORAL_CAPSULE | Freq: Two times a day (BID) | ORAL | 0 refills | Status: AC

## 2020-04-14 NOTE — Telephone Encounter (Signed)
I renewed the [place] for another 10 days. She is allergic to everything else that we can use for this type of infection.  Thanks

## 2020-04-14 NOTE — Telephone Encounter (Signed)
nitrofurantoin, macrocrystal-monohydrate, (MACROBID) 100 MG capsule Patient states she is still in pain and thinks she needs something stronger because the medication isn't helping   [id number] [address], [place] Phone:  [phone]  Fax:  [phone]

## 2020-04-15 ENCOUNTER — Emergency Department (HOSPITAL_BASED_OUTPATIENT_CLINIC_OR_DEPARTMENT_OTHER): Payer: 59

## 2020-04-15 ENCOUNTER — Emergency Department (HOSPITAL_BASED_OUTPATIENT_CLINIC_OR_DEPARTMENT_OTHER)
Admission: EM | Admit: 2020-04-15 | Discharge: 2020-04-15 | Disposition: A | Discharge status: Home / Self Care | Payer: 59 | Attending: Emergency Medicine | Admitting: Emergency Medicine

## 2020-04-15 ENCOUNTER — Encounter (HOSPITAL_BASED_OUTPATIENT_CLINIC_OR_DEPARTMENT_OTHER): Payer: Self-pay | Admitting: Emergency Medicine

## 2020-04-15 ENCOUNTER — Other Ambulatory Visit: Payer: Self-pay

## 2020-04-15 ENCOUNTER — Other Ambulatory Visit (HOSPITAL_BASED_OUTPATIENT_CLINIC_OR_DEPARTMENT_OTHER): Payer: Self-pay | Admitting: Emergency Medicine

## 2020-04-15 DIAGNOSIS — I10 Essential (primary) hypertension: Secondary | ICD-10-CM | POA: Diagnosis not present

## 2020-04-15 DIAGNOSIS — Z79899 Other long term (current) drug therapy: Secondary | ICD-10-CM | POA: Insufficient documentation

## 2020-04-15 DIAGNOSIS — R109 Unspecified abdominal pain: Secondary | ICD-10-CM | POA: Insufficient documentation

## 2020-04-15 DIAGNOSIS — Z7982 Long term (current) use of aspirin: Secondary | ICD-10-CM | POA: Diagnosis not present

## 2020-04-15 DIAGNOSIS — F17210 Nicotine dependence, cigarettes, uncomplicated: Secondary | ICD-10-CM | POA: Diagnosis not present

## 2020-04-15 LAB — CBC WITH DIFFERENTIAL/PLATELET
Abs Immature Granulocytes: 0.01 10*3/uL (ref 0.00–0.07)
Basophils Absolute: 0.1 10*3/uL (ref 0.0–0.1)
Basophils Relative: 1 %
Eosinophils Absolute: 0.2 10*3/uL (ref 0.0–0.5)
Eosinophils Relative: 3 %
HCT: 40.2 % (ref 36.0–46.0)
Hemoglobin: 13.7 g/dL (ref 12.0–15.0)
Immature Granulocytes: 0 %
Lymphocytes Relative: 29 %
Lymphs Abs: 1.6 10*3/uL (ref 0.7–4.0)
MCH: 33.1 pg (ref 26.0–34.0)
MCHC: 34.1 g/dL (ref 30.0–36.0)
MCV: 97.1 fL (ref 80.0–100.0)
Monocytes Absolute: 0.4 10*3/uL (ref 0.1–1.0)
Monocytes Relative: 8 %
Neutro Abs: 3.1 10*3/uL (ref 1.7–7.7)
Neutrophils Relative %: 59 %
Platelets: 274 10*3/uL (ref 150–400)
RBC: 4.14 MIL/uL (ref 3.87–5.11)
RDW: 12.2 % (ref 11.5–15.5)
WBC: 5.3 10*3/uL (ref 4.0–10.5)
nRBC: 0 % (ref 0.0–0.2)

## 2020-04-15 LAB — BASIC METABOLIC PANEL
Anion gap: 12 (ref 5–15)
BUN: 14 mg/dL (ref 6–20)
CO2: 26 mmol/L (ref 22–32)
Calcium: 9.5 mg/dL (ref 8.9–10.3)
Chloride: 100 mmol/L (ref 98–111)
Creatinine, Ser: 0.72 mg/dL (ref 0.44–1.00)
GFR, Estimated: >60 mL/min (ref >60)
Glucose, Bld: 115 mg/dL — ABNORMAL HIGH (ref 70–99)
Potassium: 3.6 mmol/L (ref 3.5–5.1)
Sodium: 138 mmol/L (ref 135–145)

## 2020-04-15 LAB — URINALYSIS, ROUTINE W REFLEX MICROSCOPIC
Bilirubin Urine: NEGATIVE
Glucose, UA: NEGATIVE mg/dL
Hgb urine dipstick: NEGATIVE
Ketones, ur: NEGATIVE mg/dL
Leukocytes,Ua: NEGATIVE
Nitrite: NEGATIVE
Protein, ur: NEGATIVE mg/dL
Specific Gravity, Urine: 1.01 (ref 1.005–1.030)
pH: 7.5 (ref 5.0–8.0)

## 2020-04-15 MED ORDER — SODIUM CHLORIDE 0.9 % IV BOLUS
500.0000 mL | Freq: Once | INTRAVENOUS | Status: AC
  Administered 2020-04-15: 500 mL via INTRAVENOUS

## 2020-04-15 MED ORDER — ONDANSETRON HCL 4 MG/2ML IJ SOLN
4.0000 mg | Freq: Once | INTRAMUSCULAR | Status: AC
  Administered 2020-04-15: 4 mg via INTRAVENOUS
  Filled 2020-04-15: qty 2

## 2020-04-15 MED ORDER — HYDROCODONE-ACETAMINOPHEN 5-325 MG PO TABS: 1.0000 | ORAL_TABLET | Freq: Four times a day (QID) | ORAL | 0 refills | Status: DC | PRN

## 2020-04-15 MED ORDER — FENTANYL CITRATE (PF) 100 MCG/2ML IJ SOLN
50.0000 ug | Freq: Once | INTRAMUSCULAR | Status: AC
  Administered 2020-04-15: 50 ug via INTRAVENOUS
  Filled 2020-04-15: qty 2

## 2020-04-15 MED FILL — HYDROCODON-APAP 5-325: 5-325 | 3 days supply | Qty: 10 | Fill #0

## 2020-04-15 NOTE — ED Notes (Signed)
Pt ambulatory to bathroom with steady gait and no assistance

## 2020-04-15 NOTE — ED Triage Notes (Signed)
L flank pain x 2 days with nausea. Denies urinary symptoms.

## 2020-04-15 NOTE — ED Notes (Signed)
Pt reports she was given antibiotics 2 weeks ago that she completed, then started another antibiotic this week and now the pain is getting worse.

## 2020-04-15 NOTE — Telephone Encounter (Signed)
Notified pt w/MD response.../lmb 

## 2020-04-15 NOTE — ED Provider Notes (Signed)
Tamara Charles Provider Note   CSN: [id number] Arrival date & time: 04/15/20  3614     History Chief Complaint  Patient presents with  . Flank Pain    Tamara Charles is a 59 y.o. female.  Patient is a 59 year old female who presents with left flank pain.  She states it has been going on about 2 weeks and getting worse recently.  It radiates down to her left mid abdomen.  She has been having some urinary frequency but no other urinary symptoms.  She does have a history of prior UTIs and said it felt somewhat similar.  She had a urinalysis on November 2 which grew out E. coli.  She has a number of drug allergies and was started on Macrobid.  Recently given her lack of improvement, the Macrobid was extended.  She denies any vomiting but she does have some nausea.  She has had a T-max of 99.  She recently started having some diarrhea.        Past Medical History:  Diagnosis Date  . Anxiety   . Hypertension   . Spontaneous pneumothorax    at 59 yo  . UTI (urinary tract infection)     Patient Active Problem List   Diagnosis Date Noted  . Paresthesia 07/09/2019  . Hyperglycemia 07/09/2019  . Foot pain, bilateral 07/09/2019  . Vitamin D deficiency 07/09/2019  . UTI (urinary tract infection) 05/05/2019  . Nausea 01/02/2019  . 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 Left flank pain 06/26/2018  . Acute bronchitis 06/26/2018  . Lower abdominal pain 06/26/2018  . Diverticulitis 01/28/2018  . Diarrhea 01/28/2018  . Rash and nonspecific skin eruption 10/23/2017  . Hot flash not due to menopause 06/26/2016  . Herpes simplex 04/20/2015  . Weight gain 08/25/2014  . Well adult exam 02/23/2014  . Bleb, lung ([place]) 02/23/2014  . Family history of early CAD 02/23/2014  . Tobacco use disorder 02/23/2014  . Hypertension 04/21/2013  . Spontaneous pneumothorax 04/21/2013    Past Surgical History:  Procedure Laterality Date  . ABDOMINAL HYSTERECTOMY     partial    . BREAST SURGERY Bilateral    breast reduction  . LUNG SURGERY Bilateral 1997  . REDUCTION MAMMAPLASTY       OB History   No obstetric history on file.     Family History  Problem Relation Age of Onset  . Heart disease Mother   . Hypertension Mother   . Heart disease Brother   . Breast cancer Maternal Aunt   . Bladder Cancer Maternal Grandmother     Social History   Tobacco Use  . Smoking status: Current Every Day Smoker    Packs/day: 0.50    Types: Cigarettes  . Smokeless tobacco: Never Used  Vaping Use  . Vaping Use: Never used  Substance Use Topics  . Alcohol use: Yes    Comment: occ  . Drug use: Yes    Frequency: 7.0 times per week    Types: Marijuana    Comment: daily     Home Medications Prior to Admission medications   Medication Sig Start Date End Date Taking? Authorizing Provider  aspirin EC 81 MG tablet Take 1 tablet (81 mg total) by mouth daily. 02/23/14   Plotnikov, Evie Lacks, MD  b complex vitamins tablet Take 1 tablet by mouth daily. 07/09/19   Plotnikov, Evie Lacks, MD  calcium carbonate (ANTACID) 500 MG chewable tablet Chew 1 tablet by mouth 3 (three) times daily.  [provider]  cefUROXime (CEFTIN) 250 MG tablet Take 1 tablet (250 mg total) by mouth 2 (two) times daily with a meal. 04/07/20   Plotnikov, Evie Lacks, MD  Cholecalciferol (VITAMIN D3) 50 MCG (2000 UT) capsule Take 1 capsule (2,000 Units total) by mouth daily. 07/09/19   Plotnikov, Evie Lacks, MD  gabapentin (NEURONTIN) 100 MG capsule Take 1-2 capsules (100-200 mg total) by mouth 3 (three) times daily as needed. 07/09/19   Plotnikov, Evie Lacks, MD  HYDROcodone-acetaminophen (NORCO/VICODIN) 5-325 MG tablet Take 1 tablet by mouth every 6 (six) hours as needed. 04/15/20   Malvin Johns, MD  meloxicam (MOBIC) 15 MG tablet TAKE 1 TABLET BY MOUTH EVERY DAY 03/19/20   Plotnikov, Evie Lacks, MD  naproxen sodium (ANAPROX) 220 MG tablet Take 220 mg by mouth 2 (two) times daily with a meal.     [provider]  nitrofurantoin, macrocrystal-monohydrate, (MACROBID) 100 MG capsule Take 1 capsule (100 mg total) by mouth 2 (two) times daily for 10 days. 04/14/20 04/24/20  Plotnikov, Evie Lacks, MD  nortriptyline (PAMELOR) 10 MG capsule TAKE 1 CAPSULE (10 MG TOTAL) BY MOUTH AT BEDTIME. 11/24/19   Plotnikov, Evie Lacks, MD  ondansetron (ZOFRAN ODT) 4 MG disintegrating tablet Take 1 tablet (4 mg total) by mouth every 8 (eight) hours as needed for nausea or vomiting. 07/30/19   Joy, Shawn C, PA-C  polyethylene glycol-electrolytes (NULYTELY/GOLYTELY) 420 g solution Take 4,000 mLs by mouth as directed. 02/26/19   Milus Banister, MD  Probiotic Product (ALIGN) 4 MG CAPS Take 1 capsule (4 mg total) by mouth daily. 01/02/19   Plotnikov, Evie Lacks, MD  psyllium (METAMUCIL) 58.6 % packet Take 1 packet by mouth every other day.    [provider]  triamterene-hydrochlorothiazide (MAXZIDE-25) 37.5-25 MG tablet TAKE 1 TABLET BY MOUTH EVERY DAY 06/07/19   Plotnikov, Evie Lacks, MD  valACYclovir (VALTREX) 500 MG tablet Take 1 tablet (500 mg total) by mouth at bedtime. 03/01/20   Plotnikov, Evie Lacks, MD    Allergies    Augmentin [amoxicillin-pot clavulanate], Bactrim [sulfamethoxazole-trimethoprim], Ciprofloxacin, and Flagyl [metronidazole]  Review of Systems   Review of Systems  Constitutional: Negative for chills, diaphoresis, fatigue and fever.  HENT: Negative for congestion, rhinorrhea and sneezing.   Eyes: Negative.   Respiratory: Negative for cough, chest tightness and shortness of breath.   Cardiovascular: Negative for chest pain and leg swelling.  Gastrointestinal: Positive for abdominal pain, diarrhea and nausea. Negative for blood in stool and vomiting.  Genitourinary: Positive for flank pain. Negative for difficulty urinating, frequency and hematuria.  Musculoskeletal: Negative for arthralgias and back pain.  Skin: Negative for rash.  Neurological: Negative for dizziness, speech  difficulty, weakness, numbness and headaches.    Physical Exam Updated Vital Signs BP 125/72 (BP Location: Right Arm)   Pulse 63   Temp 97.7 F (36.5 C) (Oral)   Resp 18   Ht 5\' 7"  (1.702 m)   Wt 80.7 kg   LMP 06/05/2008   SpO2 97%   BMI 27.88 kg/m   Physical Exam Constitutional:      Appearance: She is well-developed.  HENT:     Head: Normocephalic and atraumatic.  Eyes:     Pupils: Pupils are equal, round, and reactive to light.  Cardiovascular:     Rate and Rhythm: Normal rate and regular rhythm.     Heart sounds: Normal heart sounds.  Pulmonary:     Effort: Pulmonary effort is normal. No respiratory distress.  Breath sounds: Normal breath sounds. No wheezing or rales.  Chest:     Chest wall: No tenderness.  Abdominal:     General: Bowel sounds are normal.     Palpations: Abdomen is soft.     Tenderness: There is no abdominal tenderness (Positive tenderness in the left mid abdomen and left flank, no overlying skin lesions). There is no guarding or rebound.  Musculoskeletal:        General: Normal range of motion.     Cervical back: Normal range of motion and neck supple.  Lymphadenopathy:     Cervical: No cervical adenopathy.  Skin:    General: Skin is warm and dry.     Findings: No rash.  Neurological:     Mental Status: She is alert and oriented to person, place, and time.     ED Results / Procedures / Treatments   Labs (all labs ordered are listed, but only abnormal results are displayed) Labs Reviewed  BASIC METABOLIC PANEL - Abnormal; Notable for the following components:      Result Value   Glucose, Bld 115 (*)    All other components within normal limits  URINALYSIS, ROUTINE W REFLEX MICROSCOPIC  CBC WITH DIFFERENTIAL/PLATELET    EKG None  Radiology CT Renal Stone Study  Result Date: 04/15/2020 CLINICAL DATA:  Left flank pain for 2 weeks, nausea EXAM: CT ABDOMEN AND PELVIS WITHOUT CONTRAST TECHNIQUE: Multidetector CT imaging of the  abdomen and pelvis was performed following the standard protocol without IV contrast. COMPARISON:  11/26/2019 FINDINGS: Lower chest: Bases are clear.  Heart size is normal. Hepatobiliary: Unremarkable unenhanced appearance of the liver. No focal liver lesion identified. Gallbladder within normal limits. No hyperdense gallstone. No biliary dilatation. Pancreas: Unremarkable. No pancreatic ductal dilatation or surrounding inflammatory changes. Spleen: Normal in size without focal abnormality. Adrenals/Urinary Tract: Unremarkable adrenal glands. The bilateral kidneys have an unremarkable unenhanced appearance. No renal stone or hydronephrosis. Bilateral ureters are unremarkable. No ureteral calculi. Urinary bladder is normal in appearance. Stomach/Bowel: Stomach is within normal limits. Appendix appears normal (series 2, image 60). Scattered colonic diverticulosis. No evidence of bowel wall thickening, distention, or inflammatory changes. Vascular/Lymphatic: Scattered aortoiliac atherosclerotic calcifications without aneurysm. Retroaortic left renal vein. No abdominopelvic lymphadenopathy. Reproductive: Status post hysterectomy. No adnexal masses. Other: No free fluid. No abdominopelvic fluid collection. No pneumoperitoneum. No abdominal wall hernia. Musculoskeletal: No acute or significant osseous findings. Similar degree of degenerative disc disease within the lumbar spine. IMPRESSION: 1. No acute abdominopelvic findings. Specifically, no evidence of obstructive uropathy. 2. Scattered colonic diverticulosis without evidence of acute diverticulitis. 3. Aortic atherosclerosis.  (ICD10-I70.0). Electronically Signed   By: Davina Poke D.O.   On: 04/15/2020 08:11    Procedures Procedures (including critical care time)  Medications Ordered in ED Medications  fentaNYL (SUBLIMAZE) injection 50 mcg (50 mcg Intravenous Given 04/15/20 0805)  ondansetron (ZOFRAN) injection 4 mg (4 mg Intravenous Given 04/15/20 0804)   sodium chloride 0.9 % bolus 500 mL (500 mLs Intravenous New Bag/Given 04/15/20 0755)    ED Course  I have reviewed the triage vital signs and the nursing notes.  Pertinent labs & imaging results that were available during my care of the patient were reviewed by me and considered in my medical decision making (see chart for details).    MDM Rules/Calculators/A&P                          Patient is a 59 year old patient  who presents with left flank pain.  She recently has been treated for UTI.  Her urine grew out E. coli.  She has been on Macrobid.  She continues to have left flank pain.  She is afebrile.  Her labs are nonconcerning.  Her white count is normal.  Her urine actually does not look infected.  There is no blood.  I did do a CT scan which showed no evidence of kidney stone or other acute abnormality.  Her pain is improved with treatment in the ED.  She was discharged home in good condition.  She will continue her course of Macrobid until antibiotics are finished.  She was encouraged to follow-up with her doctor on Monday for recheck and was given return precautions should her symptoms worsen.  She was given a prescription for a few tablets of Vicodin for her ongoing pain. Final Clinical Impression(s) / ED Diagnoses Final diagnoses:  Left flank pain    Rx / DC Orders ED Discharge Orders         Ordered    HYDROcodone-acetaminophen (NORCO/VICODIN) 5-325 MG tablet  Every 6 hours PRN        04/15/20 0845           Malvin Johns, MD 04/15/20 [id number]

## 2020-05-10 ENCOUNTER — Ambulatory Visit (INDEPENDENT_AMBULATORY_CARE_PROVIDER_SITE_OTHER): Payer: 59 | Admitting: Internal Medicine

## 2020-05-10 ENCOUNTER — Other Ambulatory Visit: Payer: Self-pay

## 2020-05-10 ENCOUNTER — Encounter: Payer: Self-pay | Admitting: Internal Medicine

## 2020-05-10 VITALS — BP 128/82 | HR 70 | Temp 98.5°F | Wt 175.8 lb

## 2020-05-10 DIAGNOSIS — N390 Urinary tract infection, site not specified: Secondary | ICD-10-CM

## 2020-05-10 DIAGNOSIS — Z23 Encounter for immunization: Secondary | ICD-10-CM

## 2020-05-10 LAB — POCT URINALYSIS DIPSTICK
Bilirubin, UA: NEGATIVE
Blood, UA: NEGATIVE
Glucose, UA: NEGATIVE
Ketones, UA: NEGATIVE
Leukocytes, UA: NEGATIVE
Nitrite, UA: NEGATIVE
Protein, UA: NEGATIVE
Spec Grav, UA: 1.02 (ref 1.010–1.025)
Urobilinogen, UA: 0.2 E.U./dL
pH, UA: 6.5 (ref 5.0–8.0)

## 2020-05-10 MED ORDER — CEFUROXIME AXETIL 250 MG PO TABS: 250.0000 mg | ORAL_TABLET | Freq: Two times a day (BID) | ORAL | 0 refills | Status: DC

## 2020-05-10 MED ORDER — MUPIROCIN 2 % EX OINT
TOPICAL_OINTMENT | CUTANEOUS | 0 refills | Status: DC
Start: 2020-05-10 — End: 2020-10-18

## 2020-05-10 MED ORDER — MUPIROCIN 2 % EX OINT: TOPICAL_OINTMENT | CUTANEOUS | 0 refills | Status: DC

## 2020-05-10 MED ORDER — VALACYCLOVIR HCL 500 MG PO TABS
500.0000 mg | ORAL_TABLET | Freq: Every day | ORAL | 3 refills | Status: DC
Start: 2020-05-10 — End: 2021-04-28

## 2020-05-10 NOTE — Progress Notes (Signed)
Subjective:  Patient ID: Tamara Charles, female    DOB: Apr 20, 1961  Age: 59 y.o. MRN: [id number]  CC: Follow-up (UTI)   HPI Tamara Charles presents for UTI - feeling back to normal  Outpatient Medications Prior to Visit  Medication Sig Dispense Refill  . aspirin EC 81 MG tablet Take 1 tablet (81 mg total) by mouth daily. 100 tablet 3  . b complex vitamins tablet Take 1 tablet by mouth daily. 100 tablet 3  . calcium carbonate (ANTACID) 500 MG chewable tablet Chew 1 tablet by mouth 3 (three) times daily.    . Cholecalciferol (VITAMIN D3) 50 MCG (2000 UT) capsule Take 1 capsule (2,000 Units total) by mouth daily. 100 capsule 3  . meloxicam (MOBIC) 15 MG tablet TAKE 1 TABLET BY MOUTH EVERY DAY 30 tablet 1  . naproxen sodium (ANAPROX) 220 MG tablet Take 220 mg by mouth 2 (two) times daily with a meal.    . nortriptyline (PAMELOR) 10 MG capsule TAKE 1 CAPSULE (10 MG TOTAL) BY MOUTH AT BEDTIME. 90 capsule 1  . polyethylene glycol-electrolytes (NULYTELY/GOLYTELY) 420 g solution Take 4,000 mLs by mouth as directed. 4000 mL 0  . Probiotic Product (ALIGN) 4 MG CAPS Take 1 capsule (4 mg total) by mouth daily. (Patient taking differently: Take 1 capsule by mouth at bedtime. ) 30 capsule 1  . psyllium (METAMUCIL) 58.6 % packet Take 1 packet by mouth every other day.    . triamterene-hydrochlorothiazide (MAXZIDE-25) 37.5-25 MG tablet TAKE 1 TABLET BY MOUTH EVERY DAY 90 tablet 3  . valACYclovir (VALTREX) 500 MG tablet Take 1 tablet (500 mg total) by mouth at bedtime. 20 tablet 0  . cefUROXime (CEFTIN) 250 MG tablet Take 1 tablet (250 mg total) by mouth 2 (two) times daily with a meal. (Patient not taking: Reported on 05/10/2020) 8 tablet 0  . gabapentin (NEURONTIN) 100 MG capsule Take 1-2 capsules (100-200 mg total) by mouth 3 (three) times daily as needed. (Patient not taking: Reported on 05/10/2020) 90 capsule 3  . HYDROcodone-acetaminophen (NORCO/VICODIN) 5-325 MG tablet Take 1 tablet by mouth every  6 (six) hours as needed. (Patient not taking: Reported on 05/10/2020) 10 tablet 0  . ondansetron (ZOFRAN ODT) 4 MG disintegrating tablet Take 1 tablet (4 mg total) by mouth every 8 (eight) hours as needed for nausea or vomiting. (Patient not taking: Reported on 05/10/2020) 20 tablet 0   No facility-administered medications prior to visit.    ROS: Review of Systems  Constitutional: Negative for activity change, appetite change, chills, fatigue and unexpected weight change.  HENT: Negative for congestion, mouth sores and sinus pressure.   Eyes: Negative for visual disturbance.  Respiratory: Negative for cough and chest tightness.   Gastrointestinal: Negative for abdominal pain and nausea.  Genitourinary: Negative for difficulty urinating, frequency and vaginal pain.  Musculoskeletal: Negative for back pain and gait problem.  Skin: Negative for pallor and rash.  Neurological: Negative for dizziness, tremors, weakness, numbness and headaches.  Psychiatric/Behavioral: Negative for confusion and sleep disturbance.    Objective:  BP 128/82 (BP Location: Left Arm)   Pulse 70   Temp 98.5 F (36.9 C) (Oral)   Wt 175 lb 12.8 oz (79.7 kg)   LMP 06/05/2008   SpO2 97%   BMI 27.53 kg/m   BP Readings from Last 3 Encounters:  05/10/20 128/82  04/15/20 119/72  11/17/19 126/74    Wt Readings from Last 3 Encounters:  05/10/20 175 lb 12.8 oz (79.7 kg)  04/15/20 178 lb (  80.7 kg)  11/17/19 178 lb 6.4 oz (80.9 kg)    Physical Exam Constitutional:      General: She is not in acute distress.    Appearance: She is well-developed. She is obese.  HENT:     Head: Normocephalic.     Right Ear: External ear normal.     Left Ear: External ear normal.     Nose: Nose normal.  Eyes:     General:        Right eye: No discharge.        Left eye: No discharge.     Conjunctiva/sclera: Conjunctivae normal.     Pupils: Pupils are equal, round, and reactive to light.  Neck:     Thyroid: No thyromegaly.      Vascular: No JVD.     Trachea: No tracheal deviation.  Cardiovascular:     Rate and Rhythm: Normal rate and regular rhythm.     Heart sounds: Normal heart sounds.  Pulmonary:     Effort: No respiratory distress.     Breath sounds: No stridor. No wheezing.  Abdominal:     General: Bowel sounds are normal. There is no distension.     Palpations: Abdomen is soft. There is no mass.     Tenderness: There is no abdominal tenderness. There is no guarding or rebound.  Musculoskeletal:        General: No tenderness.     Cervical back: Normal range of motion and neck supple.  Lymphadenopathy:     Cervical: No cervical adenopathy.  Skin:    Findings: No erythema or rash.  Neurological:     Cranial Nerves: No cranial nerve deficit.     Motor: No abnormal muscle tone.     Coordination: Coordination normal.     Deep Tendon Reflexes: Reflexes normal.  Psychiatric:        Behavior: Behavior normal.        Thought Content: Thought content normal.        Judgment: Judgment normal.     Lab Results  Component Value Date   WBC 5.3 04/15/2020   HGB 13.7 04/15/2020   HCT 40.2 04/15/2020   PLT 274 04/15/2020   GLUCOSE 115 (H) 04/15/2020   CHOL 191 01/28/2018   TRIG 111.0 01/28/2018   HDL 43.70 01/28/2018   LDLDIRECT 146.0 08/25/2014   LDLCALC 125 (H) 01/28/2018   ALT 16 11/17/2019   AST 18 11/17/2019   NA 138 04/15/2020   K 3.6 04/15/2020   CL 100 04/15/2020   CREATININE 0.72 04/15/2020   BUN 14 04/15/2020   CO2 26 04/15/2020   TSH 0.65 07/09/2019   HGBA1C 5.6 07/09/2019    CT Renal Stone Study  Result Date: 04/15/2020 CLINICAL DATA:  Left flank pain for 2 weeks, nausea EXAM: CT ABDOMEN AND PELVIS WITHOUT CONTRAST TECHNIQUE: Multidetector CT imaging of the abdomen and pelvis was performed following the standard protocol without IV contrast. COMPARISON:  11/26/2019 FINDINGS: Lower chest: Bases are clear.  Heart size is normal. Hepatobiliary: Unremarkable unenhanced appearance of  the liver. No focal liver lesion identified. Gallbladder within normal limits. No hyperdense gallstone. No biliary dilatation. Pancreas: Unremarkable. No pancreatic ductal dilatation or surrounding inflammatory changes. Spleen: Normal in size without focal abnormality. Adrenals/Urinary Tract: Unremarkable adrenal glands. The bilateral kidneys have an unremarkable unenhanced appearance. No renal stone or hydronephrosis. Bilateral ureters are unremarkable. No ureteral calculi. Urinary bladder is normal in appearance. Stomach/Bowel: Stomach is within normal limits. Appendix appears normal (series  2, image 60). Scattered colonic diverticulosis. No evidence of bowel wall thickening, distention, or inflammatory changes. Vascular/Lymphatic: Scattered aortoiliac atherosclerotic calcifications without aneurysm. Retroaortic left renal vein. No abdominopelvic lymphadenopathy. Reproductive: Status post hysterectomy. No adnexal masses. Other: No free fluid. No abdominopelvic fluid collection. No pneumoperitoneum. No abdominal wall hernia. Musculoskeletal: No acute or significant osseous findings. Similar degree of degenerative disc disease within the lumbar spine. IMPRESSION: 1. No acute abdominopelvic findings. Specifically, no evidence of obstructive uropathy. 2. Scattered colonic diverticulosis without evidence of acute diverticulitis. 3. Aortic atherosclerosis.  (ICD10-I70.0). Electronically Signed   By: Davina Poke D.O.   On: 04/15/2020 08:11    Assessment & Plan:    Walker Kehr, MD

## 2020-05-11 ENCOUNTER — Encounter: Payer: Self-pay | Admitting: Internal Medicine

## 2020-05-13 ENCOUNTER — Other Ambulatory Visit: Payer: Self-pay | Admitting: Internal Medicine

## 2020-05-18 NOTE — Assessment & Plan Note (Signed)
Ceftin p.o. prescribed

## 2020-05-19 ENCOUNTER — Other Ambulatory Visit: Payer: Self-pay | Admitting: Internal Medicine

## 2020-06-16 ENCOUNTER — Other Ambulatory Visit: Payer: Self-pay | Admitting: Internal Medicine

## 2020-06-23 ENCOUNTER — Other Ambulatory Visit: Payer: Self-pay

## 2020-06-24 ENCOUNTER — Ambulatory Visit: Payer: 59

## 2020-07-09 ENCOUNTER — Other Ambulatory Visit: Payer: Self-pay | Admitting: Internal Medicine

## 2020-07-09 ENCOUNTER — Other Ambulatory Visit: Payer: Self-pay

## 2020-07-12 ENCOUNTER — Ambulatory Visit (INDEPENDENT_AMBULATORY_CARE_PROVIDER_SITE_OTHER): Payer: 59

## 2020-07-12 ENCOUNTER — Other Ambulatory Visit: Payer: Self-pay

## 2020-07-12 DIAGNOSIS — Z23 Encounter for immunization: Secondary | ICD-10-CM | POA: Diagnosis not present

## 2020-09-27 ENCOUNTER — Other Ambulatory Visit: Payer: Self-pay | Admitting: Internal Medicine

## 2020-10-09 ENCOUNTER — Other Ambulatory Visit: Payer: Self-pay | Admitting: Internal Medicine

## 2020-10-15 ENCOUNTER — Other Ambulatory Visit (INDEPENDENT_AMBULATORY_CARE_PROVIDER_SITE_OTHER): Payer: 59

## 2020-10-15 ENCOUNTER — Telehealth: Payer: Self-pay | Admitting: Internal Medicine

## 2020-10-15 DIAGNOSIS — R300 Dysuria: Secondary | ICD-10-CM | POA: Diagnosis not present

## 2020-10-15 LAB — URINALYSIS, ROUTINE W REFLEX MICROSCOPIC
Bilirubin Urine: NEGATIVE
Hgb urine dipstick: NEGATIVE
Ketones, ur: NEGATIVE
Leukocytes,Ua: NEGATIVE
Nitrite: NEGATIVE
RBC / HPF: NONE SEEN (ref 0–?)
Specific Gravity, Urine: 1.01 (ref 1.000–1.030)
Total Protein, Urine: NEGATIVE
Urine Glucose: NEGATIVE
Urobilinogen, UA: 0.2 (ref 0.0–1.0)
pH: 7 (ref 5.0–8.0)

## 2020-10-15 NOTE — Telephone Encounter (Signed)
Patient called and was wondering if an order could be placed for a urine sample. She can be reached at [phone]. Please advise

## 2020-10-15 NOTE — Telephone Encounter (Signed)
Notified pt w/MD response. Pt states she has made appt for Monday @ 3.Marland KitchenJohny Chess

## 2020-10-15 NOTE — Telephone Encounter (Signed)
Called pt back she states she is still having sxs of UTI, buring when urinate.. discomfort. Inform pt will enter order in epic.Marland KitchenJohny Chess

## 2020-10-15 NOTE — Telephone Encounter (Signed)
Okay UA and culture today.  Please schedule office visit next week.  Thank you

## 2020-10-17 LAB — URINE CULTURE
MICRO NUMBER:: 11888127
SPECIMEN QUALITY:: ADEQUATE

## 2020-10-18 ENCOUNTER — Other Ambulatory Visit: Payer: Self-pay

## 2020-10-18 ENCOUNTER — Ambulatory Visit (INDEPENDENT_AMBULATORY_CARE_PROVIDER_SITE_OTHER): Payer: 59 | Admitting: Internal Medicine

## 2020-10-18 ENCOUNTER — Encounter: Payer: Self-pay | Admitting: Internal Medicine

## 2020-10-18 DIAGNOSIS — N390 Urinary tract infection, site not specified: Secondary | ICD-10-CM | POA: Diagnosis not present

## 2020-10-18 DIAGNOSIS — I700 Atherosclerosis of aorta: Secondary | ICD-10-CM | POA: Diagnosis not present

## 2020-10-18 DIAGNOSIS — D485 Neoplasm of uncertain behavior of skin: Secondary | ICD-10-CM

## 2020-10-18 MED ORDER — NITROFURANTOIN MONOHYD MACRO 100 MG PO CAPS: 100.0000 mg | ORAL_CAPSULE | Freq: Two times a day (BID) | ORAL | 0 refills | Status: AC

## 2020-10-18 NOTE — Progress Notes (Signed)
Subjective:  Patient ID: Tamara Charles, female    DOB: 02/21/1961  Age: 60 y.o. MRN: [id number]  CC: Urinary Tract Infection   HPI Tamara Charles presents for fatigue L flank and LLQ sx's x since Fri. Pt finished abx 2 weeks ago - it helped (Ceftin) F/u HTN,  C/o frequency on Maxzide. C/o UTIs q 3 months Complains of skin lesions due to lifelong sun exposure  escherichia coli      URINE CULTURE, REFLEX    AMOX/CLAVULANIC 4  Sensitive    AMPICILLIN >=32  Resistant 1    AMPICILLIN/SULBACTAM 4  Sensitive    CEFAZOLIN >=64  Resistant 2    CEFEPIME 2  Resistant    CEFTRIAXONE >=64  Resistant    CIPROFLOXACIN >=4  Resistant    ERTAPENEM <=0.5  Sensitive    GENTAMICIN <=1  Sensitive    IMIPENEM <=0.25  Sensitive    LEVOFLOXACIN >=8  Resistant    NITROFURANTOIN <=16  Sensitive    PIP/TAZO <=4  Sensitive    TOBRAMYCIN <=1  Sensitive    TRIMETH/SULFA <=20  Sensitive 3          1 Extended spectrum beta-lactamase (ESBL) producing      Outpatient Medications Prior to Visit  Medication Sig Dispense Refill  . aspirin EC 81 MG tablet Take 1 tablet (81 mg total) by mouth daily. 100 tablet 3  . b complex vitamins tablet Take 1 tablet by mouth daily. 100 tablet 3  . calcium carbonate (TUMS - DOSED IN MG ELEMENTAL CALCIUM) 500 MG chewable tablet Chew 1 tablet by mouth 3 (three) times daily.    . Cholecalciferol (VITAMIN D3) 50 MCG (2000 UT) capsule Take 1 capsule (2,000 Units total) by mouth daily. 100 capsule 3  . meloxicam (MOBIC) 15 MG tablet TAKE 1 TABLET BY MOUTH EVERY DAY 30 tablet 2  . naproxen sodium (ANAPROX) 220 MG tablet Take 220 mg by mouth 2 (two) times daily with a meal.    . nortriptyline (PAMELOR) 10 MG capsule TAKE 1 CAPSULE (10 MG TOTAL) BY MOUTH AT BEDTIME. 90 capsule 1  . polyethylene glycol-electrolytes (NULYTELY/GOLYTELY) 420 g solution Take 4,000 mLs by mouth as directed. 4000 mL 0  . Probiotic Product (ALIGN) 4 MG CAPS Take 1 capsule (4 mg total) by mouth  daily. (Patient taking differently: Take 1 capsule by mouth at bedtime.) 30 capsule 1  . psyllium (METAMUCIL) 58.6 % packet Take 1 packet by mouth every other day.    . triamterene-hydrochlorothiazide (MAXZIDE-25) 37.5-25 MG tablet TAKE 1 TABLET BY MOUTH EVERY DAY 90 tablet 3  . valACYclovir (VALTREX) 500 MG tablet Take 1 tablet (500 mg total) by mouth at bedtime. 90 tablet 3  . cefUROXime (CEFTIN) 250 MG tablet Take 1 tablet (250 mg total) by mouth 2 (two) times daily with a meal. (Patient not taking: Reported on 10/18/2020) 10 tablet 0  . mupirocin ointment (BACTROBAN) 2 % In the R nostril qid (Patient not taking: Reported on 10/18/2020) 30 g 0   No facility-administered medications prior to visit.    ROS: Review of Systems  Constitutional: Negative for activity change, appetite change, chills, fatigue and unexpected weight change.  HENT: Negative for congestion, mouth sores and sinus pressure.   Eyes: Negative for visual disturbance.  Respiratory: Negative for cough and chest tightness.   Gastrointestinal: Negative for abdominal pain and nausea.  Genitourinary: Positive for frequency. Negative for difficulty urinating, dysuria and vaginal pain.  Musculoskeletal: Negative for back pain and gait  problem.  Skin: Positive for color change. Negative for pallor and rash.  Neurological: Negative for dizziness, tremors, weakness, numbness and headaches.  Psychiatric/Behavioral: Negative for confusion and sleep disturbance.    Objective:  BP (!) 152/90 (BP Location: Left Arm)   Pulse 80   Temp 98.4 F (36.9 C) (Oral)   LMP 06/05/2008   SpO2 96%   BP Readings from Last 3 Encounters:  10/18/20 (!) 152/90  05/10/20 128/82  04/15/20 119/72    Wt Readings from Last 3 Encounters:  05/10/20 175 lb 12.8 oz (79.7 kg)  04/15/20 178 lb (80.7 kg)  11/17/19 178 lb 6.4 oz (80.9 kg)    Physical Exam Constitutional:      General: She is not in acute distress.    Appearance: She is  well-developed. She is obese.  HENT:     Head: Normocephalic.     Right Ear: External ear normal.     Left Ear: External ear normal.     Nose: Nose normal.  Eyes:     General:        Right eye: No discharge.        Left eye: No discharge.     Conjunctiva/sclera: Conjunctivae normal.     Pupils: Pupils are equal, round, and reactive to light.  Neck:     Thyroid: No thyromegaly.     Vascular: No JVD.     Trachea: No tracheal deviation.  Cardiovascular:     Rate and Rhythm: Normal rate and regular rhythm.     Heart sounds: Normal heart sounds.  Pulmonary:     Effort: No respiratory distress.     Breath sounds: No stridor. No wheezing.  Abdominal:     General: Bowel sounds are normal. There is no distension.     Palpations: Abdomen is soft. There is no mass.     Tenderness: There is no abdominal tenderness. There is no guarding or rebound.  Musculoskeletal:        General: No tenderness.     Cervical back: Normal range of motion and neck supple.  Lymphadenopathy:     Cervical: No cervical adenopathy.  Skin:    Findings: No erythema or rash.  Neurological:     Cranial Nerves: No cranial nerve deficit.     Motor: No abnormal muscle tone.     Coordination: Coordination normal.     Deep Tendon Reflexes: Reflexes normal.  Psychiatric:        Behavior: Behavior normal.        Thought Content: Thought content normal.        Judgment: Judgment normal.   Skin with pigmentation changes consistent with prolonged sun exposure  Lab Results  Component Value Date   WBC 5.3 04/15/2020   HGB 13.7 04/15/2020   HCT 40.2 04/15/2020   PLT 274 04/15/2020   GLUCOSE 115 (H) 04/15/2020   CHOL 191 01/28/2018   TRIG 111.0 01/28/2018   HDL 43.70 01/28/2018   LDLDIRECT 146.0 08/25/2014   LDLCALC 125 (H) 01/28/2018   ALT 16 11/17/2019   AST 18 11/17/2019   NA 138 04/15/2020   K 3.6 04/15/2020   CL 100 04/15/2020   CREATININE 0.72 04/15/2020   BUN 14 04/15/2020   CO2 26 04/15/2020   TSH  0.65 07/09/2019   HGBA1C 5.6 07/09/2019    CT Renal Stone Study  Result Date: 04/15/2020 CLINICAL DATA:  Left flank pain for 2 weeks, nausea EXAM: CT ABDOMEN AND PELVIS WITHOUT CONTRAST TECHNIQUE: Multidetector CT  imaging of the abdomen and pelvis was performed following the standard protocol without IV contrast. COMPARISON:  11/26/2019 FINDINGS: Lower chest: Bases are clear.  Heart size is normal. Hepatobiliary: Unremarkable unenhanced appearance of the liver. No focal liver lesion identified. Gallbladder within normal limits. No hyperdense gallstone. No biliary dilatation. Pancreas: Unremarkable. No pancreatic ductal dilatation or surrounding inflammatory changes. Spleen: Normal in size without focal abnormality. Adrenals/Urinary Tract: Unremarkable adrenal glands. The bilateral kidneys have an unremarkable unenhanced appearance. No renal stone or hydronephrosis. Bilateral ureters are unremarkable. No ureteral calculi. Urinary bladder is normal in appearance. Stomach/Bowel: Stomach is within normal limits. Appendix appears normal (series 2, image 60). Scattered colonic diverticulosis. No evidence of bowel wall thickening, distention, or inflammatory changes. Vascular/Lymphatic: Scattered aortoiliac atherosclerotic calcifications without aneurysm. Retroaortic left renal vein. No abdominopelvic lymphadenopathy. Reproductive: Status post hysterectomy. No adnexal masses. Other: No free fluid. No abdominopelvic fluid collection. No pneumoperitoneum. No abdominal wall hernia. Musculoskeletal: No acute or significant osseous findings. Similar degree of degenerative disc disease within the lumbar spine. IMPRESSION: 1. No acute abdominopelvic findings. Specifically, no evidence of obstructive uropathy. 2. Scattered colonic diverticulosis without evidence of acute diverticulitis. 3. Aortic atherosclerosis.  (ICD10-I70.0). Electronically Signed   By: Davina Poke D.O.   On: 04/15/2020 08:11    Assessment &  Plan:    Follow-up: No follow-ups on file.  Walker Kehr, MD

## 2020-10-18 NOTE — Assessment & Plan Note (Signed)
Diet, exercise

## 2020-10-18 NOTE — Assessment & Plan Note (Signed)
escherichia coli      URINE CULTURE, REFLEX    AMOX/CLAVULANIC 4  Sensitive    AMPICILLIN >=32  Resistant 1    AMPICILLIN/SULBACTAM 4  Sensitive    CEFAZOLIN >=64  Resistant 2    CEFEPIME 2  Resistant    CEFTRIAXONE >=64  Resistant    CIPROFLOXACIN >=4  Resistant    ERTAPENEM <=0.5  Sensitive    GENTAMICIN <=1  Sensitive    IMIPENEM <=0.25  Sensitive    LEVOFLOXACIN >=8  Resistant    NITROFURANTOIN <=16  Sensitive    PIP/TAZO <=4  Sensitive    TOBRAMYCIN <=1  Sensitive    TRIMETH/SULFA <=20  Sensitive 3          1 Extended spectrum beta-lactamase (ESBL) producing

## 2020-10-19 DIAGNOSIS — D485 Neoplasm of uncertain behavior of skin: Secondary | ICD-10-CM | POA: Insufficient documentation

## 2020-10-19 NOTE — Assessment & Plan Note (Signed)
Skin with pigmentation changes consistent with prolonged sun exposure Dermatology consultation

## 2020-10-21 ENCOUNTER — Emergency Department (HOSPITAL_BASED_OUTPATIENT_CLINIC_OR_DEPARTMENT_OTHER): Payer: 59

## 2020-10-21 ENCOUNTER — Other Ambulatory Visit: Payer: Self-pay

## 2020-10-21 ENCOUNTER — Emergency Department (HOSPITAL_BASED_OUTPATIENT_CLINIC_OR_DEPARTMENT_OTHER)
Admission: EM | Admit: 2020-10-21 | Discharge: 2020-10-21 | Disposition: A | Discharge status: Home / Self Care | Payer: 59 | Attending: Emergency Medicine | Admitting: Emergency Medicine

## 2020-10-21 ENCOUNTER — Encounter (HOSPITAL_BASED_OUTPATIENT_CLINIC_OR_DEPARTMENT_OTHER): Payer: Self-pay | Admitting: *Deleted

## 2020-10-21 DIAGNOSIS — R1032 Left lower quadrant pain: Secondary | ICD-10-CM | POA: Diagnosis present

## 2020-10-21 DIAGNOSIS — I10 Essential (primary) hypertension: Secondary | ICD-10-CM | POA: Diagnosis not present

## 2020-10-21 DIAGNOSIS — F17210 Nicotine dependence, cigarettes, uncomplicated: Secondary | ICD-10-CM | POA: Diagnosis not present

## 2020-10-21 DIAGNOSIS — R110 Nausea: Secondary | ICD-10-CM | POA: Diagnosis not present

## 2020-10-21 DIAGNOSIS — Z7982 Long term (current) use of aspirin: Secondary | ICD-10-CM | POA: Insufficient documentation

## 2020-10-21 DIAGNOSIS — Z85828 Personal history of other malignant neoplasm of skin: Secondary | ICD-10-CM | POA: Insufficient documentation

## 2020-10-21 DIAGNOSIS — R109 Unspecified abdominal pain: Secondary | ICD-10-CM

## 2020-10-21 DIAGNOSIS — R10A2 Flank pain, left side: Secondary | ICD-10-CM

## 2020-10-21 LAB — URINALYSIS, ROUTINE W REFLEX MICROSCOPIC
Bilirubin Urine: NEGATIVE
Glucose, UA: NEGATIVE mg/dL
Hgb urine dipstick: NEGATIVE
Ketones, ur: NEGATIVE mg/dL
Leukocytes,Ua: NEGATIVE
Nitrite: NEGATIVE
Protein, ur: NEGATIVE mg/dL
Specific Gravity, Urine: 1.01 (ref 1.005–1.030)
pH: 7 (ref 5.0–8.0)

## 2020-10-21 LAB — CBC WITH DIFFERENTIAL/PLATELET
Abs Immature Granulocytes: 0.02 10*3/uL (ref 0.00–0.07)
Basophils Absolute: 0 10*3/uL (ref 0.0–0.1)
Basophils Relative: 0 %
Eosinophils Absolute: 0.5 10*3/uL (ref 0.0–0.5)
Eosinophils Relative: 7 %
HCT: 40.9 % (ref 36.0–46.0)
Hemoglobin: 14 g/dL (ref 12.0–15.0)
Immature Granulocytes: 0 %
Lymphocytes Relative: 12 %
Lymphs Abs: 0.9 10*3/uL (ref 0.7–4.0)
MCH: 33.8 pg (ref 26.0–34.0)
MCHC: 34.2 g/dL (ref 30.0–36.0)
MCV: 98.8 fL (ref 80.0–100.0)
Monocytes Absolute: 0.5 10*3/uL (ref 0.1–1.0)
Monocytes Relative: 6 %
Neutro Abs: 5.8 10*3/uL (ref 1.7–7.7)
Neutrophils Relative %: 75 %
Platelets: 264 10*3/uL (ref 150–400)
RBC: 4.14 MIL/uL (ref 3.87–5.11)
RDW: 12.4 % (ref 11.5–15.5)
WBC: 7.8 10*3/uL (ref 4.0–10.5)
nRBC: 0 % (ref 0.0–0.2)

## 2020-10-21 LAB — COMPREHENSIVE METABOLIC PANEL
ALT: 56 U/L — ABNORMAL HIGH (ref 0–44)
AST: 45 U/L — ABNORMAL HIGH (ref 15–41)
Albumin: 4.2 g/dL (ref 3.5–5.0)
Alkaline Phosphatase: 52 U/L (ref 38–126)
Anion gap: 10 (ref 5–15)
BUN: 17 mg/dL (ref 6–20)
CO2: 27 mmol/L (ref 22–32)
Calcium: 9.5 mg/dL (ref 8.9–10.3)
Chloride: 100 mmol/L (ref 98–111)
Creatinine, Ser: 0.78 mg/dL (ref 0.44–1.00)
GFR, Estimated: >60 mL/min (ref >60)
Glucose, Bld: 164 mg/dL — ABNORMAL HIGH (ref 70–99)
Potassium: 3.6 mmol/L (ref 3.5–5.1)
Sodium: 137 mmol/L (ref 135–145)
Total Bilirubin: 0.3 mg/dL (ref 0.3–1.2)
Total Protein: 7.5 g/dL (ref 6.5–8.1)

## 2020-10-21 LAB — LIPASE, BLOOD: Lipase: 55 U/L — ABNORMAL HIGH (ref 11–51)

## 2020-10-21 MED ORDER — ONDANSETRON 4 MG PO TBDP
4.0000 mg | ORAL_TABLET | Freq: Once | ORAL | Status: AC
  Administered 2020-10-21: 4 mg via ORAL
  Filled 2020-10-21: qty 1

## 2020-10-21 MED ORDER — SULFAMETHOXAZOLE-TRIMETHOPRIM 800-160 MG PO TABS
1.0000 | ORAL_TABLET | Freq: Once | ORAL | Status: AC
  Administered 2020-10-21: 1 via ORAL
  Filled 2020-10-21: qty 1

## 2020-10-21 MED ORDER — SULFAMETHOXAZOLE-TRIMETHOPRIM 800-160 MG PO TABS: 1.0000 | ORAL_TABLET | Freq: Two times a day (BID) | ORAL | 0 refills | Status: AC

## 2020-10-21 MED ORDER — ONDANSETRON 4 MG PO TBDP: 4.0000 mg | ORAL_TABLET | Freq: Three times a day (TID) | ORAL | 0 refills | Status: DC | PRN

## 2020-10-21 NOTE — ED Provider Notes (Signed)
Emergency Medicine Provider Triage Evaluation Note  Tamara Charles , a 60 y.o. female  was evaluated in triage.  Pt complains of LLQ abd pain. Friday PCP started on macrobid, told she had kidney infxn, started abx Monday. Nausea with LLQ abd pain, dec appetite. Hx diverticulitis, feels the same. Initially some odor to urine, now improved. No other urinary sx.  Tues 100.4 temp.   Review of Systems  Positive: abd pain Negative: Diarrhea, constipation  Physical Exam  BP (!) 153/95 (BP Location: Left Arm)   Pulse 82   Temp 98.5 F (36.9 C) (Oral)   Resp 18   Ht 5\' 7"  (1.702 m)   Wt 75.8 kg   LMP 06/05/2008   SpO2 95%   BMI 26.16 kg/m  Gen:   Awake, no distress   Resp:  Normal effort  MSK:   Moves extremities without difficulty  Other:  Epigastric, LUQ and LLQ ttp, not peritonitic  Medical Decision Making  Medically screening exam initiated at 2:10 PM.  Appropriate orders placed.  Ottie Neglia was informed that the remainder of the evaluation will be completed by another provider, this initial triage assessment does not replace that evaluation, and the importance of remaining in the ED until their evaluation is complete.  Consider diverticulitis vs pyelonephritis vs colitis vs nephrolithiasis: CT AP zofran odt   Willamina Grieshop, [place] N, PA-C 10/21/20 1414    Quintella Reichert, MD 10/21/20 [phone]

## 2020-10-21 NOTE — Discharge Instructions (Signed)
Please discontinue taking the Macrobid. Start taking the Bactrim every 12 hours until gone.  Take the Zofran 30 minutes prior to the antibiotic to help prevent associated nausea that he experienced in the past with this medication. Drink plenty of fluids. You have  been provided with a referral to urology, I encourage you follow-up for further evaluation of your recurrent positive urine cultures and symptoms. Return to the emergency department if you develop high fevers, worsening pain, uncontrollable vomiting, or other concerning symptoms.

## 2020-10-21 NOTE — ED Provider Notes (Signed)
[facility] EMERGENCY DEPARTMENT Provider Note   CSN: [id number] Arrival date & time: 10/21/20  1340     History Chief Complaint  Patient presents with  . Abdominal Pain    Tamara Charles is a 60 y.o. female past medical history of recurrent UTI, diverticulitis, presenting for evaluation of left lower quadrant abdominal pain.  States pain started Friday, she was evaluate by her PCP who did urine studies.  Per review of medical record, her urine culture grew ESBL.  States she was started on Macrobid on Monday.  Her associated symptoms include malodorous urine left flank pain.  The urine order has resolved since starting the antibiotic, however she has developed left lower quadrant abdominal pain as well with nausea and decreased appetite.  She has a history of diverticulitis and states this feels the same.  She states on Tuesday she had a temperature of 100.4 F.  No fever since.  No current urinary symptoms.  States she has been treated for pyelonephritis frequently.  No pelvic complaints.  Status post hysterectomy in 2010.  Review of medical record, patient has multiple urine cultures that grew ESBL, that were followed by negative UAs.   The history is provided by the patient and medical records.       Past Medical History:  Diagnosis Date  . Anxiety   . Hypertension   . Spontaneous pneumothorax    at 60 yo  . UTI (urinary tract infection)     Patient Active Problem List   Diagnosis Date Noted  . 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 Atherosclerosis of aorta ([place]) 10/18/2020  . Paresthesia 07/09/2019  . Hyperglycemia 07/09/2019  . Foot pain, bilateral 07/09/2019  . Vitamin D deficiency 07/09/2019  . UTI (urinary tract infection) 05/05/2019  . Nausea 01/02/2019  . 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 Left flank pain 06/26/2018  . Acute bronchitis 06/26/2018  . Lower abdominal pain 06/26/2018  . Diverticulitis 01/28/2018  . Diarrhea 01/28/2018  .  Rash and nonspecific skin eruption 10/23/2017  . Hot flash not due to menopause 06/26/2016  . Herpes simplex 04/20/2015  . Weight gain 08/25/2014  . Well adult exam 02/23/2014  . Bleb, lung ([place]) 02/23/2014  . Family history of early CAD 02/23/2014  . Tobacco use disorder 02/23/2014  . Hypertension 04/21/2013  . Spontaneous pneumothorax 04/21/2013    Past Surgical History:  Procedure Laterality Date  . ABDOMINAL HYSTERECTOMY     partial  . BREAST SURGERY Bilateral    breast reduction  . LUNG SURGERY Bilateral 1997  . REDUCTION MAMMAPLASTY       OB History   No obstetric history on file.     Family History  Problem Relation Age of Onset  . Heart disease Mother   . Hypertension Mother   . Heart disease Brother   . Breast cancer Maternal Aunt   . Bladder Cancer Maternal Grandmother     Social History   Tobacco Use  . Smoking status: Current Every Day Smoker    Packs/day: 0.50    Types: Cigarettes  . Smokeless tobacco: Never Used  Vaping Use  . Vaping Use: Never used  Substance Use Topics  . Alcohol use: Yes    Comment: occ  . Drug use: Yes    Frequency: 7.0 times per week    Types: Marijuana    Comment: daily     Home Medications Prior to Admission medications   Medication Sig Start Date End Date Taking? Authorizing Provider  ondansetron (ZOFRAN ODT) 4 MG disintegrating tablet Take 1 tablet (4 mg total) by mouth every 8 (eight) hours as needed for nausea or vomiting. 10/21/20  Yes Twilia Yaklin, [place] N, PA-C  sulfamethoxazole-trimethoprim (BACTRIM DS) 800-160 MG tablet Take 1 tablet by mouth 2 (two) times daily for 10 days. 10/21/20 10/31/20 Yes Kemyra August, [place] N, PA-C  aspirin EC 81 MG tablet Take 1 tablet (81 mg total) by mouth daily. 02/23/14   Plotnikov, Georgina QuintAleksei V, MD  b complex vitamins tablet Take 1 tablet by mouth daily. 07/09/19   Plotnikov, Georgina QuintAleksei V, MD  calcium carbonate (TUMS - DOSED IN MG ELEMENTAL CALCIUM) 500 MG chewable tablet Chew 1 tablet by mouth 3  (three) times daily.    [provider]  Cholecalciferol (VITAMIN D3) 50 MCG (2000 UT) capsule Take 1 capsule (2,000 Units total) by mouth daily. 07/09/19   Plotnikov, Georgina QuintAleksei V, MD  meloxicam (MOBIC) 15 MG tablet TAKE 1 TABLET BY MOUTH EVERY DAY 10/11/20   Plotnikov, Georgina QuintAleksei V, MD  naproxen sodium (ANAPROX) 220 MG tablet Take 220 mg by mouth 2 (two) times daily with a meal.    [provider]  nitrofurantoin, macrocrystal-monohydrate, (MACROBID) 100 MG capsule Take 1 capsule (100 mg total) by mouth 2 (two) times daily for 7 days. Use prn bladder infection for 7 days at the time 10/18/20 10/25/20  Plotnikov, Georgina QuintAleksei V, MD  nortriptyline (PAMELOR) 10 MG capsule TAKE 1 CAPSULE (10 MG TOTAL) BY MOUTH AT BEDTIME. 05/19/20   Plotnikov, Georgina QuintAleksei V, MD  polyethylene glycol-electrolytes (NULYTELY/GOLYTELY) 420 g solution Take 4,000 mLs by mouth as directed. 02/26/19   Rachael FeeJacobs, Daniel P, MD  Probiotic Product (ALIGN) 4 MG CAPS Take 1 capsule (4 mg total) by mouth daily. Patient taking differently: Take 1 capsule by mouth at bedtime. 01/02/19   Plotnikov, Georgina QuintAleksei V, MD  psyllium (METAMUCIL) 58.6 % packet Take 1 packet by mouth every other day.    [provider]  valACYclovir (VALTREX) 500 MG tablet Take 1 tablet (500 mg total) by mouth at bedtime. 05/10/20   Plotnikov, Georgina QuintAleksei V, MD    Allergies    Augmentin [amoxicillin-pot clavulanate], Bactrim [sulfamethoxazole-trimethoprim], Ciprofloxacin, and Flagyl [metronidazole]  Review of Systems   Review of Systems  All other systems reviewed and are negative.   Physical Exam Updated Vital Signs BP (!) 133/102 (BP Location: Right Arm)   Pulse 74   Temp 98.4 F (36.9 C) (Oral)   Resp 18   Ht 5\' 7"  (1.702 m)   Wt 75.8 kg   LMP 06/05/2008   SpO2 96%   BMI 26.16 kg/m   Physical Exam Vitals and nursing note reviewed.  Constitutional:      General: She is not in acute distress.    Appearance: She is well-developed. She is not  ill-appearing.  HENT:     Head: Normocephalic and atraumatic.  Eyes:     Conjunctiva/sclera: Conjunctivae normal.  Cardiovascular:     Rate and Rhythm: Normal rate and regular rhythm.  Pulmonary:     Effort: Pulmonary effort is normal.     Breath sounds: Normal breath sounds.  Abdominal:     General: Abdomen is flat. Bowel sounds are normal.     Palpations: Abdomen is soft.     Tenderness: There is abdominal tenderness in the left upper quadrant and left lower quadrant. There is left CVA tenderness. There is no right CVA tenderness, guarding or rebound.  Skin:    General: Skin is warm.  Neurological:  Mental Status: She is alert.  Psychiatric:        Behavior: Behavior normal.     ED Results / Procedures / Treatments   Labs (all labs ordered are listed, but only abnormal results are displayed) Labs Reviewed  COMPREHENSIVE METABOLIC PANEL - Abnormal; Notable for the following components:      Result Value   Glucose, Bld 164 (*)    AST 45 (*)    ALT 56 (*)    All other components within normal limits  LIPASE, BLOOD - Abnormal; Notable for the following components:   Lipase 55 (*)    All other components within normal limits  CBC WITH DIFFERENTIAL/PLATELET  URINALYSIS, ROUTINE W REFLEX MICROSCOPIC    EKG None  Radiology CT Abdomen Pelvis Wo Contrast  Result Date: 10/21/2020 CLINICAL DATA:  Acute left lower quadrant abdominal pain. EXAM: CT ABDOMEN AND PELVIS WITHOUT CONTRAST TECHNIQUE: Multidetector CT imaging of the abdomen and pelvis was performed following the standard protocol without IV contrast. COMPARISON:  April 15, 2020. FINDINGS: Lower chest: No acute abnormality. Hepatobiliary: No focal liver abnormality is seen. No gallstones, gallbladder wall thickening, or biliary dilatation. Pancreas: Unremarkable. No pancreatic ductal dilatation or surrounding inflammatory changes. Spleen: Normal in size without focal abnormality. Adrenals/Urinary Tract: Adrenal glands  are unremarkable. Kidneys are normal, without renal calculi, focal lesion, or hydronephrosis. Bladder is unremarkable. Stomach/Bowel: Stomach is within normal limits. Appendix appears normal. No evidence of bowel wall thickening, distention, or inflammatory changes. Diverticulosis of descending and sigmoid colon is noted without inflammation. Vascular/Lymphatic: No significant vascular findings are present. No enlarged abdominal or pelvic lymph nodes. Reproductive: Status post hysterectomy. No adnexal masses. Other: No abdominal wall hernia or abnormality. No abdominopelvic ascites. Musculoskeletal: No acute or significant osseous findings. IMPRESSION: Diverticulosis of descending and sigmoid colon without inflammation. No acute abnormality seen in the abdomen or pelvis. Electronically Signed   By: Marijo Conception M.D.   On: 10/21/2020 14:57    Procedures Procedures   Medications Ordered in ED Medications  sulfamethoxazole-trimethoprim (BACTRIM DS) 800-160 MG per tablet 1 tablet (has no administration in time range)  ondansetron (ZOFRAN-ODT) disintegrating tablet 4 mg (4 mg Oral Given 10/21/20 1420)    ED Course  I have reviewed the triage vital signs and the nursing notes.  Pertinent labs & imaging results that were available during my care of the patient were reviewed by me and considered in my medical decision making (see chart for details).    MDM Rules/Calculators/A&P                          Patient is presenting for evaluation of left lower quadrant pain, treated outpatient with Macrobid for reported "kidney infection."  Per review of medical record, urine culture on 10/15/2020 grew ESBL.  In reviewing prior urine cultures, ESBL is also present.  Her UA was negative on the 13th, as it is today.  States her malodorous urine has resolved, however is continued to have persistent left flank pain and now left lower quadrant abdominal pain which she was concerned for possible developing  diverticulitis.  Her CT scan however is negative.  Her blood work is reassuring with normal white blood cell count.  No significant electrolyte derangements on metabolic panel.  Lipase is unremarkable.  He is well-appearing and in no distress, afebrile.  Discussed antibiotic therapy with attending physician Dr. Melina Copa.  Patient is having active symptoms of left flank pain, will broaden coverage with  Bactrim, as Macrobid is not ideal for pyelonephritis.  States her adverse reaction to Bactrim is only nausea.  Nausea is improved here with Zofran, will prescribe this, instructed to take it 30 minutes prior to Bactrim dosing.  Will provide urology referral as well for further evaluation of her recurrent positive urine cultures UTI symptoms.  Return if worsening, high fevers, uncontrollable vomiting.  Discussed results, findings, treatment and follow up. Patient advised of return precautions. Patient verbalized understanding and agreed with plan.     Final Clinical Impression(s) / ED Diagnoses Final diagnoses:  Left flank pain  Left lower quadrant abdominal pain    Rx / DC Orders ED Discharge Orders         Ordered    sulfamethoxazole-trimethoprim (BACTRIM DS) 800-160 MG tablet  2 times daily        10/21/20 1748    ondansetron (ZOFRAN ODT) 4 MG disintegrating tablet  Every 8 hours PRN        10/21/20 1748           Gunner Iodice, [place] N, PA-C 10/21/20 1748    Hayden Rasmussen, MD 10/22/20 [phone]

## 2020-10-21 NOTE — ED Triage Notes (Signed)
C/o left lower abd pain x 6 days, dx UTI x 5 days ago hx diverticulitis

## 2020-10-21 NOTE — ED Notes (Signed)
MD in to triage MSE done

## 2020-11-29 ENCOUNTER — Ambulatory Visit: Payer: 59 | Admitting: Internal Medicine

## 2020-11-30 ENCOUNTER — Other Ambulatory Visit: Payer: Self-pay | Admitting: Internal Medicine

## 2020-12-10 ENCOUNTER — Telehealth: Payer: Self-pay | Admitting: *Deleted

## 2020-12-10 MED ORDER — PAXLOVID 20 X 150 MG & 10 X 100MG PO TBPK: 3.0000 | ORAL_TABLET | Freq: Two times a day (BID) | ORAL | 0 refills | Status: DC

## 2020-12-10 NOTE — Telephone Encounter (Signed)
OK Paxlovid Thx

## 2020-12-10 NOTE — Telephone Encounter (Signed)
Rec'd call from husband stating wife has also tested positive. She's having the same sxs, and would like rx sent to pharmacy as well.Marland KitchenJohny Chess

## 2020-12-13 NOTE — Telephone Encounter (Signed)
Pt picked med up over the weekend.Marland KitchenJohny Charles

## 2021-01-10 ENCOUNTER — Other Ambulatory Visit: Payer: Self-pay | Admitting: Internal Medicine

## 2021-03-17 ENCOUNTER — Emergency Department (HOSPITAL_BASED_OUTPATIENT_CLINIC_OR_DEPARTMENT_OTHER): Payer: 59

## 2021-03-17 ENCOUNTER — Encounter (HOSPITAL_BASED_OUTPATIENT_CLINIC_OR_DEPARTMENT_OTHER): Payer: Self-pay

## 2021-03-17 ENCOUNTER — Other Ambulatory Visit: Payer: Self-pay

## 2021-03-17 ENCOUNTER — Emergency Department (HOSPITAL_BASED_OUTPATIENT_CLINIC_OR_DEPARTMENT_OTHER)
Admission: EM | Admit: 2021-03-17 | Discharge: 2021-03-17 | Disposition: A | Discharge status: Home / Self Care | Payer: 59 | Attending: Emergency Medicine | Admitting: Emergency Medicine

## 2021-03-17 DIAGNOSIS — K5732 Diverticulitis of large intestine without perforation or abscess without bleeding: Secondary | ICD-10-CM | POA: Diagnosis not present

## 2021-03-17 DIAGNOSIS — K5792 Diverticulitis of intestine, part unspecified, without perforation or abscess without bleeding: Secondary | ICD-10-CM

## 2021-03-17 DIAGNOSIS — Z7982 Long term (current) use of aspirin: Secondary | ICD-10-CM | POA: Diagnosis not present

## 2021-03-17 DIAGNOSIS — I10 Essential (primary) hypertension: Secondary | ICD-10-CM | POA: Insufficient documentation

## 2021-03-17 DIAGNOSIS — I2510 Atherosclerotic heart disease of native coronary artery without angina pectoris: Secondary | ICD-10-CM | POA: Insufficient documentation

## 2021-03-17 DIAGNOSIS — R1032 Left lower quadrant pain: Secondary | ICD-10-CM | POA: Diagnosis present

## 2021-03-17 DIAGNOSIS — F17210 Nicotine dependence, cigarettes, uncomplicated: Secondary | ICD-10-CM | POA: Diagnosis not present

## 2021-03-17 LAB — COMPREHENSIVE METABOLIC PANEL
ALT: 12 U/L (ref 0–44)
AST: 18 U/L (ref 15–41)
Albumin: 4.4 g/dL (ref 3.5–5.0)
Alkaline Phosphatase: 47 U/L (ref 38–126)
Anion gap: 8 (ref 5–15)
BUN: 11 mg/dL (ref 6–20)
CO2: 28 mmol/L (ref 22–32)
Calcium: 9.8 mg/dL (ref 8.9–10.3)
Chloride: 103 mmol/L (ref 98–111)
Creatinine, Ser: 0.72 mg/dL (ref 0.44–1.00)
GFR, Estimated: >60 mL/min (ref >60)
Glucose, Bld: 95 mg/dL (ref 70–99)
Potassium: 3.4 mmol/L — ABNORMAL LOW (ref 3.5–5.1)
Sodium: 139 mmol/L (ref 135–145)
Total Bilirubin: 0.4 mg/dL (ref 0.3–1.2)
Total Protein: 7.2 g/dL (ref 6.5–8.1)

## 2021-03-17 LAB — URINALYSIS, ROUTINE W REFLEX MICROSCOPIC
Bilirubin Urine: NEGATIVE
Glucose, UA: NEGATIVE mg/dL
Hgb urine dipstick: NEGATIVE
Ketones, ur: NEGATIVE mg/dL
Leukocytes,Ua: NEGATIVE
Nitrite: NEGATIVE
Protein, ur: NEGATIVE mg/dL
Specific Gravity, Urine: 1.015 (ref 1.005–1.030)
pH: 7 (ref 5.0–8.0)

## 2021-03-17 LAB — CBC WITH DIFFERENTIAL/PLATELET
Abs Immature Granulocytes: 0.02 10*3/uL (ref 0.00–0.07)
Basophils Absolute: 0.1 10*3/uL (ref 0.0–0.1)
Basophils Relative: 1 %
Eosinophils Absolute: 0.2 10*3/uL (ref 0.0–0.5)
Eosinophils Relative: 4 %
HCT: 40.7 % (ref 36.0–46.0)
Hemoglobin: 14.2 g/dL (ref 12.0–15.0)
Immature Granulocytes: 0 %
Lymphocytes Relative: 32 %
Lymphs Abs: 1.7 10*3/uL (ref 0.7–4.0)
MCH: 34 pg (ref 26.0–34.0)
MCHC: 34.9 g/dL (ref 30.0–36.0)
MCV: 97.4 fL (ref 80.0–100.0)
Monocytes Absolute: 0.4 10*3/uL (ref 0.1–1.0)
Monocytes Relative: 8 %
Neutro Abs: 3 10*3/uL (ref 1.7–7.7)
Neutrophils Relative %: 55 %
Platelets: 283 10*3/uL (ref 150–400)
RBC: 4.18 MIL/uL (ref 3.87–5.11)
RDW: 12.4 % (ref 11.5–15.5)
WBC: 5.4 10*3/uL (ref 4.0–10.5)
nRBC: 0 % (ref 0.0–0.2)

## 2021-03-17 LAB — LIPASE, BLOOD: Lipase: 34 U/L (ref 11–51)

## 2021-03-17 MED ORDER — IOHEXOL 300 MG/ML  SOLN
100.0000 mL | Freq: Once | INTRAMUSCULAR | Status: AC | PRN
  Administered 2021-03-17: 100 mL via INTRAVENOUS

## 2021-03-17 MED ORDER — METRONIDAZOLE 500 MG PO TABS: 500.0000 mg | ORAL_TABLET | Freq: Two times a day (BID) | ORAL | 0 refills | Status: DC

## 2021-03-17 MED ORDER — FENTANYL CITRATE PF 50 MCG/ML IJ SOSY
50.0000 ug | PREFILLED_SYRINGE | Freq: Once | INTRAMUSCULAR | Status: AC
  Administered 2021-03-17: 50 ug via INTRAVENOUS
  Filled 2021-03-17: qty 1

## 2021-03-17 MED ORDER — ONDANSETRON 4 MG PO TBDP: 4.0000 mg | ORAL_TABLET | Freq: Three times a day (TID) | ORAL | 0 refills | Status: DC | PRN

## 2021-03-17 MED ORDER — CEFDINIR 300 MG PO CAPS: 300.0000 mg | ORAL_CAPSULE | Freq: Two times a day (BID) | ORAL | 0 refills | Status: DC

## 2021-03-17 MED ORDER — SODIUM CHLORIDE 0.9 % IV BOLUS
1000.0000 mL | Freq: Once | INTRAVENOUS | Status: AC
  Administered 2021-03-17: 1000 mL via INTRAVENOUS

## 2021-03-17 NOTE — ED Triage Notes (Signed)
Pt c/o LLQ abdominal and L flank pain pain since Saturday with diarrhea and nausea. Pt has hx of diverticulosis. Pt also on abx since June for recurrent UTI

## 2021-03-17 NOTE — ED Provider Notes (Signed)
[facility] EMERGENCY DEPARTMENT Provider Note   CSN: [id number] Arrival date & time: 03/17/21  1048     History Chief Complaint  Patient presents with   Abdominal Pain    Tamara Charles is a 60 y.o. female.  Is here with a complaint of left lower quadrant pain 7 out of 10 radiating to back started yesterday.  Associated with 6 episodes of loose watery foul-smelling diarrhea.  No fevers or chills.  Nausea no vomiting.  No urinary symptoms.  He is on chronic antibiotic suppression for UTI.  History of abdominal hysterectomy.  No sick contacts or recent travel.  The history is provided by the patient.  Abdominal Pain Pain location:  LLQ Pain quality: cramping   Pain radiates to:  Back Pain severity:  Moderate Onset quality:  Gradual Duration:  2 days Timing:  Intermittent Progression:  Unchanged Chronicity:  New Context: not recent travel, not sick contacts, not suspicious food intake and not trauma   Relieved by:  None tried Worsened by:  Nothing Ineffective treatments:  None tried Associated symptoms: diarrhea and nausea   Associated symptoms: no chest pain, no chills, no constipation, no dysuria, no fever, no hematemesis, no hematochezia, no shortness of breath, no sore throat, no vaginal bleeding and no vomiting       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   Atherosclerosis of aorta (HCC) 10/18/2020   Paresthesia 07/09/2019   Hyperglycemia 07/09/2019   Foot pain, bilateral 07/09/2019   Vitamin D deficiency 07/09/2019   UTI (urinary tract infection) 05/05/2019   Nausea 01/02/2019   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   Left flank pain 06/26/2018   Acute bronchitis 06/26/2018   Lower abdominal pain 06/26/2018   Diverticulitis 01/28/2018   Diarrhea  01/28/2018   Rash and nonspecific skin eruption 10/23/2017   Hot flash not due to menopause 06/26/2016   Herpes simplex 04/20/2015   Weight gain 08/25/2014   Well adult exam 02/23/2014   Bleb, lung ([place]) 02/23/2014   Family history of early CAD 02/23/2014   Tobacco use disorder 02/23/2014   Hypertension 04/21/2013   Spontaneous pneumothorax 04/21/2013    Past Surgical History:  Procedure Laterality Date   ABDOMINAL HYSTERECTOMY     partial   BREAST SURGERY Bilateral    breast reduction   LUNG SURGERY Bilateral 1997   REDUCTION MAMMAPLASTY       OB History   No obstetric history on file.     Family History  Problem Relation Age of Onset   Heart disease Mother    Hypertension Mother    Heart disease Brother    Breast cancer Maternal Aunt    Bladder Cancer Maternal Grandmother     Social History   Tobacco Use   Smoking status: Every Day    Packs/day: 0.50    Types: Cigarettes   Smokeless tobacco: Never  Vaping Use   Vaping Use: Never used  Substance Use Topics   Alcohol use: Yes    Comment: occ   Drug use: Yes    Frequency: 7.0 times per week    Types: Marijuana    Comment: daily     Home Medications Prior to Admission medications   Medication Sig Start Date End Date Taking?  Authorizing Provider  aspirin EC 81 MG tablet Take 1 tablet (81 mg total) by mouth daily. 02/23/14   Plotnikov, Evie Lacks, MD  b complex vitamins tablet Take 1 tablet by mouth daily. 07/09/19   Plotnikov, Evie Lacks, MD  calcium carbonate (TUMS - DOSED IN MG ELEMENTAL CALCIUM) 500 MG chewable tablet Chew 1 tablet by mouth 3 (three) times daily.    [provider]  Cholecalciferol (VITAMIN D3) 50 MCG (2000 UT) capsule Take 1 capsule (2,000 Units total) by mouth daily. 07/09/19   Plotnikov, Evie Lacks, MD  meloxicam (MOBIC) 15 MG tablet TAKE 1 TABLET BY MOUTH EVERY DAY 01/10/21   Plotnikov, Evie Lacks, MD  naproxen sodium (ANAPROX) 220 MG tablet Take 220 mg by mouth 2 (two) times daily  with a meal.    [provider]  Nirmatrelvir & Ritonavir (PAXLOVID) 20 x 150 MG & 10 x 100MG  TBPK Take 3 tablets by mouth in the morning and at bedtime. As directed on a package 12/10/20   Plotnikov, Evie Lacks, MD  nortriptyline (PAMELOR) 10 MG capsule TAKE 1 CAPSULE BY MOUTH AT BEDTIME. 11/30/20   Plotnikov, Evie Lacks, MD  ondansetron (ZOFRAN ODT) 4 MG disintegrating tablet Take 1 tablet (4 mg total) by mouth every 8 (eight) hours as needed for nausea or vomiting. 10/21/20   Robinson, [place] N, PA-C  polyethylene glycol-electrolytes (NULYTELY/GOLYTELY) 420 g solution Take 4,000 mLs by mouth as directed. 02/26/19   Milus Banister, MD  Probiotic Product (ALIGN) 4 MG CAPS Take 1 capsule (4 mg total) by mouth daily. Patient taking differently: Take 1 capsule by mouth at bedtime. 01/02/19   Plotnikov, Evie Lacks, MD  psyllium (METAMUCIL) 58.6 % packet Take 1 packet by mouth every other day.    [provider]  valACYclovir (VALTREX) 500 MG tablet Take 1 tablet (500 mg total) by mouth at bedtime. 05/10/20   Plotnikov, Evie Lacks, MD    Allergies    Augmentin [amoxicillin-pot clavulanate], Bactrim [sulfamethoxazole-trimethoprim], Ciprofloxacin, and Flagyl [metronidazole]  Review of Systems   Review of Systems  Constitutional:  Negative for chills and fever.  HENT:  Negative for sore throat.   Eyes:  Negative for visual disturbance.  Respiratory:  Negative for shortness of breath.   Cardiovascular:  Negative for chest pain.  Gastrointestinal:  Positive for abdominal pain, diarrhea and nausea. Negative for constipation, hematemesis, hematochezia and vomiting.  Genitourinary:  Negative for dysuria and vaginal bleeding.  Musculoskeletal:  Positive for back pain.  Skin:  Negative for rash.  Neurological:  Negative for headaches.  All other systems reviewed and are negative.  Physical Exam Updated Vital Signs BP (!) 165/77 (BP Location: Right Arm)   Pulse 62   Temp 97.8 F (36.6 C)    Resp 12   Ht 5\' 7"  (1.702 m)   Wt 74.8 kg   LMP 06/05/2008   SpO2 100%   BMI 25.84 kg/m   Physical Exam Vitals and nursing note reviewed.  Constitutional:      General: She is not in acute distress.    Appearance: Normal appearance. She is well-developed.  HENT:     Head: Normocephalic and atraumatic.  Eyes:     Conjunctiva/sclera: Conjunctivae normal.  Cardiovascular:     Rate and Rhythm: Normal rate and regular rhythm.     Heart sounds: No murmur heard. Pulmonary:     Effort: Pulmonary effort is normal. No respiratory distress.     Breath sounds: Normal breath sounds.  Abdominal:  Palpations: Abdomen is soft.     Tenderness: There is abdominal tenderness in the left lower quadrant. There is no guarding or rebound.  Musculoskeletal:        General: No deformity or signs of injury. Normal range of motion.     Cervical back: Neck supple.  Skin:    General: Skin is warm and dry.  Neurological:     General: No focal deficit present.     Mental Status: She is alert.    ED Results / Procedures / Treatments   Labs (all labs ordered are listed, but only abnormal results are displayed) Labs Reviewed  COMPREHENSIVE METABOLIC PANEL - Abnormal; Notable for the following components:      Result Value   Potassium 3.4 (*)    All other components within normal limits  GASTROINTESTINAL PANEL BY PCR, STOOL (REPLACES STOOL CULTURE)  C DIFFICILE QUICK SCREEN W PCR REFLEX    LIPASE, BLOOD  CBC WITH DIFFERENTIAL/PLATELET  URINALYSIS, ROUTINE W REFLEX MICROSCOPIC    EKG None  Radiology CT Abdomen Pelvis W Contrast  Result Date: 03/17/2021 CLINICAL DATA:  Left lower quadrant abdominal pain, left flank pain, diarrhea, nausea EXAM: CT ABDOMEN AND PELVIS WITH CONTRAST TECHNIQUE: Multidetector CT imaging of the abdomen and pelvis was performed using the standard protocol following bolus administration of intravenous contrast. CONTRAST:  118mL OMNIPAQUE IOHEXOL 300 MG/ML  SOLN  COMPARISON:  CT abdomen/pelvis 10/21/2020 FINDINGS: Lower chest: There is minimal subsegmental atelectasis and/or scar in the left base. The imaged heart is unremarkable. Hepatobiliary: The liver and gallbladder are unremarkable. There is no biliary ductal dilatation. Pancreas: Unremarkable. Spleen: Unremarkable. Adrenals/Urinary Tract: The adrenals are unremarkable. The kidneys are unremarkable, with no focal lesion, stone, hydronephrosis, or hydroureter. The bladder is unremarkable. Stomach/Bowel: The stomach is unremarkable. There is no evidence of bowel obstruction. There is colonic diverticulosis. There is mild wall thickening involving the sigmoid colon with mild surrounding inflammatory change (5-50, 2-61). There is no other abnormal bowel wall thickening or inflammatory change. Vascular/Lymphatic: There is minimal calcified atherosclerotic plaque in the nonaneurysmal abdominal aorta. The major branch vessels are patent. The main portal and splenic veins are patent. Reproductive: The uterus is surgically absent. There is a subcentimeter cyst in the left adnexa, unchanged. There is no right adnexal mass. Other: There is no ascites or free air. Musculoskeletal: There is mild degenerative change of the lumbar spine. There is no acute osseous abnormality or aggressive osseous lesion. IMPRESSION: Colonic diverticulosis with mild wall thickening and surrounding inflammatory change involving the sigmoid colon suggesting mild acute uncomplicated diverticulitis. Aortic Atherosclerosis (ICD10-I70.0). Electronically Signed   By: Valetta Mole M.D.   On: 03/17/2021 13:41    Procedures Procedures   Medications Ordered in ED Medications  sodium chloride 0.9 % bolus 1,000 mL (has no administration in time range)  fentaNYL (SUBLIMAZE) injection 50 mcg (has no administration in time range)    ED Course  I have reviewed the triage vital signs and the nursing notes.  Pertinent labs & imaging results that were  available during my care of the patient were reviewed by me and considered in my medical decision making (see chart for details).  Clinical Course as of 03/17/21 1729  Thu Mar 17, 2021  1345 CT showing acute uncomplicated diverticulitis.  I reviewed this with the patient.  She has had no further diarrhea here so unable to give a stool sample for C. difficile test.  She is comfortable plan for outpatient management on antibiotics.  Return instructions discussed [MB]  1410 Reviewed patient's allergies with pharmacy.  They are recommending cefdinir and metronidazole.  Patient comfortable plan. [MB]    Clinical Course User Index [MB] Hayden Rasmussen, MD   MDM Rules/Calculators/A&P                          This patient complains of left lower quadrant abdominal pain and frequent loose stool; this involves an extensive number of treatment Options and is a complaint that carries with it a high risk of complications and Morbidity. The differential includes diverticulitis, colitis, infectious diarrhea, C. difficile, obstruction, renal colic  I ordered, reviewed and interpreted labs, which included CBC with normal white count normal hemoglobin, chemistries normal, LFTs normal, urinalysis without signs of infection.  Stool studies and C. difficile ordered but not obtained I ordered medication IV fluids IV pain medication with improvement in her symptoms I ordered imaging studies which included CT abdomen and pelvis and I independently    visualized and interpreted imaging which showed sigmoid colonic thickening consistent with acute uncomplicated diverticulitis Previous records obtained and reviewed in epic including prior visit for diverticulitis I consulted pharmacy and discussed lab and imaging findings, and they were able to give suggestions regarding antibiotic management due to patient's multiple antibiotic allergies/sensitivities  Critical Interventions: None  After the interventions stated  above, I reevaluated the patient and found patient to be hemodynamically stable.  Pain is adequately controlled.  She is comfortable plan for outpatient management of her symptoms with antibiotics and dietary changes.  Recommended close follow-up with PCP and return instructions discussed   Final Clinical Impression(s) / ED Diagnoses Final diagnoses:  Acute diverticulitis    Rx / DC Orders ED Discharge Orders          Ordered    cefdinir (OMNICEF) 300 MG capsule  2 times daily        03/17/21 1410    metroNIDAZOLE (FLAGYL) 500 MG tablet  2 times daily        03/17/21 1410    ondansetron (ZOFRAN ODT) 4 MG disintegrating tablet  Every 8 hours PRN        03/17/21 1410             Hayden Rasmussen, MD 03/17/21 1731

## 2021-03-17 NOTE — Discharge Instructions (Addendum)
You were seen in the emergency department for left lower abdominal pain and loose stool.  Your lab work was unremarkable but your CAT scan showed signs of acute diverticulitis.  This is treated with antibiotics and the clear liquid diet until pain is improved.  Return to the emergency department if high fever or worsening symptoms.  Follow-up with your primary care doctor.

## 2021-03-21 ENCOUNTER — Other Ambulatory Visit: Payer: Self-pay

## 2021-03-21 ENCOUNTER — Emergency Department (HOSPITAL_BASED_OUTPATIENT_CLINIC_OR_DEPARTMENT_OTHER)
Admission: EM | Admit: 2021-03-21 | Discharge: 2021-03-21 | Disposition: A | Discharge status: Home / Self Care | Payer: 59 | Attending: Emergency Medicine | Admitting: Emergency Medicine

## 2021-03-21 ENCOUNTER — Encounter (HOSPITAL_BASED_OUTPATIENT_CLINIC_OR_DEPARTMENT_OTHER): Payer: Self-pay

## 2021-03-21 ENCOUNTER — Telehealth: Payer: Self-pay

## 2021-03-21 DIAGNOSIS — F17210 Nicotine dependence, cigarettes, uncomplicated: Secondary | ICD-10-CM | POA: Diagnosis not present

## 2021-03-21 DIAGNOSIS — R21 Rash and other nonspecific skin eruption: Secondary | ICD-10-CM | POA: Diagnosis not present

## 2021-03-21 DIAGNOSIS — Z85828 Personal history of other malignant neoplasm of skin: Secondary | ICD-10-CM | POA: Diagnosis not present

## 2021-03-21 DIAGNOSIS — I10 Essential (primary) hypertension: Secondary | ICD-10-CM | POA: Insufficient documentation

## 2021-03-21 DIAGNOSIS — R208 Other disturbances of skin sensation: Secondary | ICD-10-CM | POA: Diagnosis not present

## 2021-03-21 DIAGNOSIS — Z79899 Other long term (current) drug therapy: Secondary | ICD-10-CM | POA: Diagnosis not present

## 2021-03-21 DIAGNOSIS — Z7982 Long term (current) use of aspirin: Secondary | ICD-10-CM | POA: Diagnosis not present

## 2021-03-21 MED ORDER — DIPHENHYDRAMINE HCL 25 MG PO CAPS
50.0000 mg | ORAL_CAPSULE | Freq: Once | ORAL | Status: AC
  Administered 2021-03-21: 50 mg via ORAL
  Filled 2021-03-21: qty 2

## 2021-03-21 MED ORDER — AMOXICILLIN-POT CLAVULANATE 875-125 MG PO TABS: 1.0000 | ORAL_TABLET | Freq: Two times a day (BID) | ORAL | 0 refills | Status: DC

## 2021-03-21 NOTE — Telephone Encounter (Signed)
Please advise as the pt has stated she is having an allergic reaction to her medications she was recently given for a flare up with her diverticulitis. Pt states she was taking cefdinir (OMNICEF) 300 MG capsule and metroNIDAZOLE (FLAGYL) 500 MG tablet.  **Pt states she has a rash all over and hives a/w some vomiting. Pt was advise to go to UC or ED asap. Pt is on her way now to ED.  **Pt has asked that an alternative be sent in for the above meds.

## 2021-03-21 NOTE — ED Triage Notes (Signed)
Pt c/o allergic reaction to meds for her diverticulitis. Started meds on Thursday. Taking cefdinir & flagyl. Rash and itching, states skin is burning. Denies SOB or trouble swallowing.

## 2021-03-21 NOTE — Discharge Instructions (Addendum)
Discontinue cefdinir and Flagyl. Take Augmentin, be sure to take probiotics such as yogurt with live cultures and Culturelle.  Discontinue use if not tolerated and see your doctor.  Discuss follow up with allergist with your PCP to possibly test for antibiotic allergies.

## 2021-03-21 NOTE — Telephone Encounter (Signed)
Noted. Thanks.

## 2021-03-21 NOTE — ED Provider Notes (Signed)
[facility] Provider Note   CSN: [id number] Arrival date & time: 03/21/21  1222     History Chief Complaint  Patient presents with   Allergic Reaction    Tamara Charles is a 60 y.o. female.  60 year old female presents with concern for allergic reaction. Patient was diagnosed with diverticulitis 5 days ago, has been taking Flagyl and Omnicef but has developed a rash on her trunk with a burning in her throat today. Denies wheezing, coughing, SHOB, mouth sores. Multiple antibiotic reactions previously.       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   Left flank pain 06/26/2018   Acute bronchitis 06/26/2018   Lower abdominal pain 06/26/2018   Diverticulitis 01/28/2018   Diarrhea 01/28/2018   Rash and nonspecific skin eruption 10/23/2017   Hot flash not due to menopause 06/26/2016   Herpes simplex 04/20/2015   Weight gain 08/25/2014   Well adult exam 02/23/2014   Bleb, lung ([place]) 02/23/2014   Family history of early CAD 02/23/2014   Tobacco use disorder 02/23/2014   Hypertension 04/21/2013   Spontaneous pneumothorax 04/21/2013    Past Surgical History:  Procedure Laterality Date   ABDOMINAL HYSTERECTOMY     partial   BREAST SURGERY Bilateral    breast reduction   LUNG SURGERY Bilateral 1997   REDUCTION MAMMAPLASTY       OB History   No obstetric history on file.     Family History  Problem Relation Age of Onset   Heart disease Mother    Hypertension Mother     Heart disease Brother    Breast cancer Maternal Aunt    Bladder Cancer Maternal Grandmother     Social History   Tobacco Use   Smoking status: Every Day    Packs/day: 0.50    Types: Cigarettes   Smokeless tobacco: Never  Vaping Use   Vaping Use: Never used  Substance Use Topics   Alcohol use: Yes    Comment: occ   Drug use: Yes    Frequency: 7.0 times per week    Types: Marijuana    Comment: daily     Home Medications Prior to Admission medications   Medication Sig Start Date End Date Taking? Authorizing Provider  amoxicillin-clavulanate (AUGMENTIN) 875-125 MG tablet Take 1 tablet by mouth every 12 (twelve) hours. 03/21/21  Yes Tacy Learn, PA-C  aspirin EC 81 MG tablet Take 1 tablet (81 mg total) by mouth daily. 02/23/14   Plotnikov, Evie Lacks, MD  b complex vitamins tablet Take 1 tablet by mouth daily. 07/09/19   Plotnikov, Evie Lacks, MD  calcium carbonate (TUMS - DOSED IN MG ELEMENTAL CALCIUM) 500 MG chewable tablet Chew 1 tablet by mouth 3 (three) times daily.    [provider]  cefdinir (OMNICEF) 300 MG capsule Take 1 capsule (300 mg total) by mouth 2 (two) times daily. 03/17/21   Hayden Rasmussen, MD  Cholecalciferol (VITAMIN D3) 50 MCG (2000 UT) capsule  Take 1 capsule (2,000 Units total) by mouth daily. 07/09/19   Plotnikov, Evie Lacks, MD  meloxicam (MOBIC) 15 MG tablet TAKE 1 TABLET BY MOUTH EVERY DAY 01/10/21   Plotnikov, Evie Lacks, MD  metroNIDAZOLE (FLAGYL) 500 MG tablet Take 1 tablet (500 mg total) by mouth 2 (two) times daily. 03/17/21   Hayden Rasmussen, MD  naproxen sodium (ANAPROX) 220 MG tablet Take 220 mg by mouth 2 (two) times daily with a meal.    [provider]  Nirmatrelvir & Ritonavir (PAXLOVID) 20 x 150 MG & 10 x 100MG  TBPK Take 3 tablets by mouth in the morning and at bedtime. As directed on a package 12/10/20   Plotnikov, Evie Lacks, MD  nortriptyline (PAMELOR) 10 MG capsule TAKE 1 CAPSULE BY MOUTH AT BEDTIME. 11/30/20   Plotnikov, Evie Lacks, MD  ondansetron (ZOFRAN ODT) 4 MG disintegrating tablet Take 1 tablet (4 mg total) by mouth every 8 (eight) hours as needed for nausea or vomiting. 03/17/21   Hayden Rasmussen, MD  polyethylene glycol-electrolytes (NULYTELY/GOLYTELY) 420 g solution Take 4,000 mLs by mouth as directed. 02/26/19   Milus Banister, MD  Probiotic Product (ALIGN) 4 MG CAPS Take 1 capsule (4 mg total) by mouth daily. Patient taking differently: Take 1 capsule by mouth at bedtime. 01/02/19   Plotnikov, Evie Lacks, MD  psyllium (METAMUCIL) 58.6 % packet Take 1 packet by mouth every other day.    [provider]  valACYclovir (VALTREX) 500 MG tablet Take 1 tablet (500 mg total) by mouth at bedtime. 05/10/20   Plotnikov, Evie Lacks, MD    Allergies    Augmentin [amoxicillin-pot clavulanate], Bactrim [sulfamethoxazole-trimethoprim], Ciprofloxacin, Cefdinir, and Flagyl [metronidazole]  Review of Systems   Review of Systems  Constitutional:  Negative for chills and fever.  HENT:  Negative for sore throat and trouble swallowing.   Respiratory:  Negative for cough, shortness of breath and wheezing.   Cardiovascular:  Negative for chest pain.  Gastrointestinal:  Positive for abdominal pain. Negative for nausea and vomiting.  Skin:  Positive for rash.  Allergic/Immunologic: Negative for immunocompromised state.  Neurological:  Negative for weakness.  Psychiatric/Behavioral:  Negative for confusion.   All other systems reviewed and are negative.  Physical Exam Updated Vital Signs BP (!) 155/84 (BP Location: Left Arm)   Pulse 76   Temp 98.1 F (36.7 C) (Oral)   Resp 18   Ht 5\' 7"  (1.702 m)   Wt 74.8 kg   LMP 06/05/2008   SpO2 98%   BMI 25.84 kg/m   Physical Exam Vitals and nursing note reviewed.  Constitutional:      General: She is not in acute distress.    Appearance: She is well-developed. She is not diaphoretic.  HENT:     Head: Normocephalic and atraumatic.     Mouth/Throat:     Mouth:  Mucous membranes are moist.     Pharynx: Oropharynx is clear. No oropharyngeal exudate or posterior oropharyngeal erythema.  Eyes:     Conjunctiva/sclera: Conjunctivae normal.  Cardiovascular:     Rate and Rhythm: Normal rate and regular rhythm.     Heart sounds: Normal heart sounds.  Pulmonary:     Effort: Pulmonary effort is normal.     Breath sounds: Normal breath sounds.  Musculoskeletal:        General: No swelling or tenderness.     Cervical back: Neck supple.  Skin:    General: Skin is warm and dry.  Findings: Rash present.     Comments: Fine rash to trunk, not palpable, blanches  Neurological:     Mental Status: She is alert and oriented to person, place, and time.  Psychiatric:        Behavior: Behavior normal.    ED Results / Procedures / Treatments   Labs (all labs ordered are listed, but only abnormal results are displayed) Labs Reviewed - No data to display  EKG None  Radiology No results found.  Procedures Procedures   Medications Ordered in ED Medications  diphenhydrAMINE (BENADRYL) capsule 50 mg (50 mg Oral Given 03/21/21 1455)    ED Course  I have reviewed the triage vital signs and the nursing notes.  Pertinent labs & imaging results that were available during my care of the patient were reviewed by me and considered in my medical decision making (see chart for details).  Clinical Course as of 03/21/21 1850  Mon Mar 22, 7143  1756 60 year old female with concern for rash with itching and burning, on cefdinir.  Has taken Flagyl previously with reaction of nausea, suspicious for cefdinir causing her rash today.  Patient is on exam biotics for diverticulitis.  Limited options due to her multiple allergies and intolerances.  Recommend discontinue the cefdinir and Flagyl.  Reaction to Augmentin is limited to diarrhea.  Discussed use of yogurt and probiotics, will attempt to treat her diverticulitis with Augmentin, recommend close follow-up with PCP.   Return to ER as needed.  Given Benadryl earlier in her ER course with improvement in symptoms and requesting discharge.  No wheezing or respiratory symptoms.  May continue Benadryl at home. [LM]    Clinical Course User Index [LM] Roque Lias   MDM Rules/Calculators/A&P                           Final Clinical Impression(s) / ED Diagnoses Final diagnoses:  Rash    Rx / DC Orders ED Discharge Orders          Ordered    amoxicillin-clavulanate (AUGMENTIN) 875-125 MG tablet  Every 12 hours        03/21/21 1532             Roque Lias 03/21/21 1850    Luna Fuse, MD 03/25/21 1525

## 2021-03-29 ENCOUNTER — Encounter: Payer: Self-pay | Admitting: Internal Medicine

## 2021-03-29 ENCOUNTER — Other Ambulatory Visit: Payer: Self-pay

## 2021-03-29 ENCOUNTER — Ambulatory Visit (INDEPENDENT_AMBULATORY_CARE_PROVIDER_SITE_OTHER): Payer: 59 | Admitting: Internal Medicine

## 2021-03-29 VITALS — BP 132/80 | HR 77 | Temp 98.4°F | Ht 67.0 in | Wt 162.0 lb

## 2021-03-29 DIAGNOSIS — L270 Generalized skin eruption due to drugs and medicaments taken internally: Secondary | ICD-10-CM | POA: Diagnosis not present

## 2021-03-29 DIAGNOSIS — K5792 Diverticulitis of intestine, part unspecified, without perforation or abscess without bleeding: Secondary | ICD-10-CM

## 2021-03-29 DIAGNOSIS — T3695XA Adverse effect of unspecified systemic antibiotic, initial encounter: Secondary | ICD-10-CM | POA: Insufficient documentation

## 2021-03-29 DIAGNOSIS — I700 Atherosclerosis of aorta: Secondary | ICD-10-CM

## 2021-03-29 DIAGNOSIS — N390 Urinary tract infection, site not specified: Secondary | ICD-10-CM

## 2021-03-29 NOTE — Progress Notes (Signed)
Subjective:  Patient ID: Tamara Charles, female    DOB: July 08, 1960  Age: 60 y.o. MRN: [id number]  CC: Follow-up (ER follow-up)   HPI Tamara Charles presents for LLQ abd pain x 2 wks - worse. Tamara Charles had 4 diverticulitis attacks in 4 years. C/o recent rash w/abx  Outpatient Medications Prior to Visit  Medication Sig Dispense Refill   aspirin EC 81 MG tablet Take 1 tablet (81 mg total) by mouth daily. 100 tablet 3   b complex vitamins tablet Take 1 tablet by mouth daily. 100 tablet 3   calcium carbonate (TUMS - DOSED IN MG ELEMENTAL CALCIUM) 500 MG chewable tablet Chew 1 tablet by mouth 3 (three) times daily.     Cholecalciferol (VITAMIN D3) 50 MCG (2000 UT) capsule Take 1 capsule (2,000 Units total) by mouth daily. 100 capsule 3   meloxicam (MOBIC) 15 MG tablet TAKE 1 TABLET BY MOUTH EVERY DAY 30 tablet 2   naproxen sodium (ANAPROX) 220 MG tablet Take 220 mg by mouth 2 (two) times daily with a meal.     nortriptyline (PAMELOR) 10 MG capsule TAKE 1 CAPSULE BY MOUTH AT BEDTIME. 90 capsule 1   ondansetron (ZOFRAN ODT) 4 MG disintegrating tablet Take 1 tablet (4 mg total) by mouth every 8 (eight) hours as needed for nausea or vomiting. 20 tablet 0   polyethylene glycol-electrolytes (NULYTELY/GOLYTELY) 420 g solution Take 4,000 mLs by mouth as directed. 4000 mL 0   Probiotic Product (ALIGN) 4 MG CAPS Take 1 capsule (4 mg total) by mouth daily. (Patient taking differently: Take 1 capsule by mouth at bedtime.) 30 capsule 1   psyllium (METAMUCIL) 58.6 % packet Take 1 packet by mouth every other day.     triamterene-hydrochlorothiazide (MAXZIDE-25) 37.5-25 MG tablet Take 1 tablet by mouth daily. Take 1 by mouth daily     trimethoprim (TRIMPEX) 100 MG tablet Take 100 mg by mouth daily.     valACYclovir (VALTREX) 500 MG tablet Take 1 tablet (500 mg total) by mouth at bedtime. 90 tablet 3   amoxicillin-clavulanate (AUGMENTIN) 875-125 MG tablet Take 1 tablet by mouth every 12 (twelve) hours. (Patient  not taking: Reported on 03/29/2021) 14 tablet 0   cefdinir (OMNICEF) 300 MG capsule Take 1 capsule (300 mg total) by mouth 2 (two) times daily. (Patient not taking: Reported on 03/29/2021) 14 capsule 0   metroNIDAZOLE (FLAGYL) 500 MG tablet Take 1 tablet (500 mg total) by mouth 2 (two) times daily. (Patient not taking: Reported on 03/29/2021) 14 tablet 0   Nirmatrelvir & Ritonavir (PAXLOVID) 20 x 150 MG & 10 x 100MG  TBPK Take 3 tablets by mouth in the morning and at bedtime. As directed on a package (Patient not taking: Reported on 03/29/2021) 30 tablet 0   No facility-administered medications prior to visit.    ROS: Review of Systems  Objective:  BP 132/80 (BP Location: Left Arm)   Pulse 77   Temp 98.4 F (36.9 C) (Oral)   Ht 5\' 7"  (1.702 m)   Wt 162 lb (73.5 kg)   LMP 06/05/2008   SpO2 97%   BMI 25.37 kg/m   BP Readings from Last 3 Encounters:  03/29/21 132/80  03/21/21 (!) 155/84  03/17/21 127/64    Wt Readings from Last 3 Encounters:  03/29/21 162 lb (73.5 kg)  03/21/21 165 lb (74.8 kg)  03/17/21 165 lb (74.8 kg)    Physical Exam  Lab Results  Component Value Date   WBC 5.4 03/17/2021   HGB  14.2 03/17/2021   HCT 40.7 03/17/2021   PLT 283 03/17/2021   GLUCOSE 95 03/17/2021   CHOL 191 01/28/2018   TRIG 111.0 01/28/2018   HDL 43.70 01/28/2018   LDLDIRECT 146.0 08/25/2014   LDLCALC 125 (H) 01/28/2018   ALT 12 03/17/2021   AST 18 03/17/2021   NA 139 03/17/2021   K 3.4 (L) 03/17/2021   CL 103 03/17/2021   CREATININE 0.72 03/17/2021   BUN 11 03/17/2021   CO2 28 03/17/2021   TSH 0.65 07/09/2019   HGBA1C 5.6 07/09/2019    No results found.  Assessment & Plan:   Problem List Items Addressed This Visit     Antibiotic-induced allergic rash   Atherosclerosis of aorta (HCC)    On diet      Relevant Medications   triamterene-hydrochlorothiazide (MAXZIDE-25) 37.5-25 MG tablet   Diverticulitis - Primary    Recent Augmentin - some diarrhea. No rash. Able  to tolerate OK  LLQ abd pain x 2 wks - worse. Tamara Charles had 4 diverticulitis attacks in 4 years. Surgery referral       Relevant Orders   Ambulatory referral to General Surgery   UTI (urinary tract infection)    On daily Trimethoprim - hold while on other abx      Relevant Medications   trimethoprim (TRIMPEX) 100 MG tablet      No orders of the defined types were placed in this encounter.     Follow-up: No follow-ups on file.  Walker Kehr, MD

## 2021-03-29 NOTE — Assessment & Plan Note (Addendum)
Recent Augmentin - some diarrhea. No rash. Able to tolerate OK  LLQ abd pain x 2 wks - worse. Aalijah had 4 diverticulitis attacks in 4 years. Surgery referral

## 2021-03-29 NOTE — Assessment & Plan Note (Signed)
On daily Trimethoprim - hold while on other abx

## 2021-03-29 NOTE — Assessment & Plan Note (Signed)
  On diet  

## 2021-04-12 ENCOUNTER — Other Ambulatory Visit: Payer: Self-pay | Admitting: Internal Medicine

## 2021-04-28 ENCOUNTER — Other Ambulatory Visit: Payer: Self-pay | Admitting: Internal Medicine

## 2021-05-12 ENCOUNTER — Telehealth: Payer: Self-pay

## 2021-05-12 ENCOUNTER — Telehealth: Payer: Self-pay | Admitting: Gastroenterology

## 2021-05-12 NOTE — Telephone Encounter (Signed)
Spoke with pt about follow up visit scheduled for 06/05/21 at 10:30 with DJ. No further questions at this time.

## 2021-05-12 NOTE — Telephone Encounter (Signed)
Left message for pt.     From:  Milus Banister, MD  Original Sender:  Hollace Kinnier ([place])  Addressed To:  Milus Banister, MD  Routed To:  Michael Boston, MD; Timothy Lasso, RN  Context:  Referral  Subject:  FW: Referral on 05/12/2021   FW: Referral on 05/12/2021 Received: Today Milus Banister, MD  Michael Boston, MD; Timothy Lasso, RN Tamara Charles,  Can't promise this is done exactly the day before her surgery but we will certainly try. She cancelled a screening colonoscopy in 2020 and we haven't heard from her since then.   Tamara Charles,  Can you reach out to her. She needs OV with myself or any extender to [facility] care and set up for colonoscopy   Thanks all        Previous Messages Referral Summary 05/12/2021 Referral Summary     Tamara Charles - 60 y.o. Female; born [birth year]Referral Summary, generated on Dec. 08, 2022December 08, 2022  Reason for Referral  Consultation (Urgent) - Pending Review Reason for Referral - Consultation (Urgent) - Pending Review Specialty Diagnoses / Procedures Referred By Contact Referred To Contact  Gastroenterology Diagnoses   Diverticulitis of sigmoid colon   Tobacco abuse   Recurrent UTI (urinary tract infection)    Gross, Adrian Saran, MD   [address]   [place]   [place], [place] [address]   Phone: [phone]   Fax: [phone]   Milus Banister, MD   [address]   [place], [place] [address]   Phone: [phone]   Fax: [phone]     Reason for Referral - Consultation (Urgent) - Pending Review Referral ID Status Reason Start Date Expiration Date Visits Requested Visits Authorized  [id number] Pending Review   05/12/2021 05/12/2022 1 1   Reason for Referral - Consultation (Urgent) - Pending Review Comments  We need to have colonoscopy set up for 07/14/21 for Diverticulitis surgery to be done on 07/15/21 by Dr Johney Maine   Jiles Harold surgery scheduler has  reached out to your office but was told that we could not schedule the colonoscopy for 07/14/21 b/c pt has not been seen by your office. We need to please have this worked out so the patient can be taken care of for the sake of only having to do one bowel prep for colonoscopy/sx date.      Electronically signed by Hollace Kinnier MD at 05/12/2021 9:40 AM EST   Reason for Visit  Reason for Visit -  Reason Comments  Diverticulitis     Encounter Details  Encounter Details Date Type Department Care Team Description  05/09/2021 Initial consult [facility]   [place], [place] [address]   [place], [place], [address]   [place], [place] [address]   [phone] (Work)   [phone] (Fax)     Johney Maine, Adrian Saran, MD   [address]   [place]   [place], [place] [address]   [phone] (Work)   [phone] (Fax)   Diverticulitis of sigmoid colon (Primary Dx);  Tobacco abuse;  Recurrent UTI (urinary tract infection)   Allergies - documented as of this encounter (statuses as of 05/12/2021) Reconcile with Patient's Chart Allergies Active Allergy Reactions Severity Noted Date Comments  Amoxicillin-Pot Clavulanate Other (See Comments)   12/05/2018 Some diarrhea. No  rash. Able to tolerate    Cefdinir Rash Low 03/21/2021 Was taking with flagyl, developed rash    Ciprofloxacin Unknown   12/05/2018 Sun rash    Metronidazole Rash Low 12/05/2018 Nausea - needs anti-nausea Rx w/it      Took with cefdinir and developed rash    Sulfamethoxazole-Trimethoprim Nausea   11/26/2019     Medications - documented as of this encounter (statuses as of 05/12/2021) Reconcile with Patient's Chart Medications Medication Sig Dispensed Refills Start Date End Date Status  valACYclovir (VALTREX) 500 MG tablet   Take 1 tablet (500 mg total) by mouth at bedtime   0 04/28/2021   Active  trimethoprim 100 mg tablet       0 04/12/2021    Active  naproxen sodium (ALEVE) 220 MG tablet   Take by mouth   0     Active  nortriptyline (PAMELOR) 10 MG capsule   Take 1 capsule (10 mg total) by mouth at bedtime   0 11/30/2020   Active  ondansetron (ZOFRAN-ODT) 4 MG disintegrating tablet   Take 1 tablet (4 mg total) by mouth every 8 (eight) hours as needed   0 03/17/2021   Active  psyllium, aspartame, (METAMUCIL) 3.4 gram packet   Take by mouth   0     Active  calcium carbonate (TUMS) 200 mg calcium (500 mg) chewable tablet   Take by mouth   0     Active  cefdinir (OMNICEF) 300 mg capsule   Take 1 capsule (300 mg total) by mouth 2 (two) times daily   0 03/17/2021   Active  cholecalciferol (VITAMIN D3) 2,000 unit capsule   Take by mouth   0 07/09/2019   Active  meloxicam (MOBIC) 15 MG tablet       0 04/12/2021   Active  metroNIDAZOLE (FLAGYL) 500 MG tablet   Take 1 tablet (500 mg total) by mouth 2 (two) times daily   0 03/17/2021   Active   Active Problems - documented as of this encounter (statuses as of 05/12/2021) Reconcile with Patient's Chart Active Problems Problem Noted Date  Diverticulitis of sigmoid colon 05/09/2021  Tobacco abuse 05/09/2021   Social History - documented as of this encounter Social History Tobacco Use Types Packs/Day Years Used Date  Smoking Tobacco: Never          Smokeless Tobacco: Never           Social History Tobacco Cessation: Counseling Given: Not Answered   Social History Alcohol Use Standard Drinks/Week Comments  Yes 0 (1 standard drink = 0.6 oz pure alcohol)     Social History Sex Assigned at [employer] Date Recorded  Not on file     Social History COVID-19 Exposure Response Date Recorded  In the last 10 days, have you been in contact with someone who was confirmed or suspected to have Coronavirus/COVID-19? No / Unsure 05/09/2021 2:58 PM EST   Last Filed Vital Signs - documented in this encounter Last Filed Vital Signs Vital Sign Reading Time Taken Comments  Blood Pressure 138/90  05/09/2021 3:26 PM EST    Pulse 84 05/09/2021 3:26 PM EST    Temperature 36.9 C (98.4 F) 05/09/2021 3:26 PM EST    Respiratory Rate - -    Oxygen Saturation 96% 05/09/2021 3:26 PM EST    Inhaled Oxygen Concentration - -    Weight 75.9 kg (167 lb 6.4 oz) 05/09/2021 3:26 PM EST    Height 170.2 cm (5\' 7" ) 05/09/2021 3:26  PM EST    Body Mass Index 26.22 05/09/2021 3:26 PM EST     Patient Instructions - documented in this encounter Patient Instructions Gross, Adrian Saran, MD - 05/09/2021 3:15 PM EST   Formatting of this note is different from the original. Images from the original note were not included. #############################################################  YOU ARE BEING SCHEDULED FOR SURGERY to robotically resect your descending/sigmoid colon where you get recurrent diverticulitis.   Ideally you get colonoscopy before surgery. Hopefully the day before by [facility] gastroenterology.  You should hear from our office's scheduling department by phone within 5 working days about the location, date, and time of surgery.  If you have not heard from our office in a week, call the office to request an update from your surgeon's nurse.  Our [facility] Surgery Schedulers work hard to meet patient's requests in scheduling surgery, but sometimes the Operating Room or the Surgeon's schedule prevent [place] from making ideal plans.  Please review instructions prior to your surgery (need for a bowel prep, need to hold any blood thinner medication, post-operative instructions, etc).   If you have any questions about your diagnosis, recommendations, preoperative instructions, plan for surgery, or other concerns; then, please call the office to seek advice from your surgeon's nurse.  [facility] [phone]  Main  ####################################################################################  ####################################################################################  COLON BOWEL PREP  FIVE DAYS PRIOR TO YOUR SURGERY  Obtain supplies for the bowel prep at a pharmacy of your choice: Office-e-mailed prescriptions for your antibiotic pills (Neomycin & Metronidazole)  A bottle of MiraLax / Glycolax (238g) - no prescription required  A large bottle of Gatorade / Powerade (64oz) - - no prescription required  Dulcolax tablets (4 tablets) - no prescription required   Change your diet to make the bowel prep go more easily: Switch to a bland, low fiber diet Stop eating any nuts, popcorn, or fruit with seeds.  Stop all fiber supplements such as Metamucil, Miralax, etc.   Improve nutrition: Consider drinking 2-3 nutritional shakes (Ex: Ensure Surgery) every day, starting 5 days prior to surgery  DAY PRIOR TO SURGERY   Switch to a liquid diet the day before surgery Drink plenty of liquids all day to avoid getting dehydrated  If you are having nausea or vomiting, hold the neomycin antibiotic & just take metronidazole tablets.  7:00am Swallow 4 Dulcolax tablets with some water  10:00am Mix the bottle of MiraLax with the 64-oz bottle of Gatorade.  Drink the Gatorade/Miralax mixture gradually (8oz glass every 15 minutes) until gone. (You should finish in 4 hours)  2:00pm Take 2 Neomycin 500mg  tablets & 2 Metronidazole 500mg  tablets  3:00pm Take 2 Neomycin 500mg  tablets & 2 Metronidazole 500mg  tablets  Drink plenty of clear liquids all evening to avoid getting dehydrated  10:00pm Take 2 Neomycin 500mg  tablets & 2 Metronidazole 500mg  tablets  Drink 2 Carbohydrate loading nutrition drinks (ex: Ensure Presurgery) Do not eat anything solid after bedtime (midnight) the night before your surgery.  BUT DO drink plenty of clear liquids (Water, Gatorade, juice, soda, coffee, tea,  broths, etc.) up to 2 hours prior to surgery to avoid getting dehydrated.   MORNING OF SURGERY  Remember to not to eat anything solid that morning  Drink one final carbohydrate loading nutritional drink (ex: Ensure Presurgery) upon waking up in the morning Hold or take medications as recommended by the hospital staff at your Preoperative visit Stop drinking liquids before you leave the house (>2 hours prior to surgery)  If  you have questions or concerns, please call [facility] [phone] during business hours to speak to the clinical staff for advice.  ##########################################################################################    #############################  Diverticular Disease  Diverticulosis of the colon is a common condition that afflicts about 55 percent of Americans by age 17 and nearly all by age 52. Only a small percentage of those with diverticulosis have symptoms, and even fewer will ever require surgery.   What is Diverticulosis/ Diverticulitis?  Diverticula are pockets that develop in the colon wall, usually in the sigmoid or left colon, but may involve the entire colon. Diverticulosis describes the presence of these pockets. Diverticulitis describes inflammation or complications of these pockets.   What are the symptoms of diverticular disease?  Uncomplicated diverticular disease is usually not associated with symptoms. Symptoms are related to complications of diverticular disease including diverticulits and bleeding. Diverticular disease is a common cause of significant bleeding from the colon.  Diverticulitis - an infection of the diverticula - may cause one or more of the following symptoms: pain in the abdomen, chills, fever and change in bowel habits. More intense symptoms are associated with serious complications such as perforation (rupture), abscess or fistula formation (an abnormal connection between the colon and another organ or the  skin).  What is the cause of diverticular disease?  The cause of diverticulosis and diverticulitis is not precisely known, but it is more common for people with a low fiber diet. It is thought that a low-fiber diet over the years creates increased colon pressure and results in pockets or diverticula.   How is diverticular disease treated?  Increasing the amount of dietary fiber (grains, legumes, vegetables, etc.) - and sometimes restricting certain foods reduces the pressure in the colon and may decrease the risk of complications due to diverticular disease.  Diverticulitis requires different management. Mild cases may be managed with oral antibiotics, dietary restrictions and possibly stool softeners. More severe cases require hospitalization with intravenous antibiotics and dietary restraints. Most acute attacks can be relieved with such methods.   When is surgery necessary?  Surgery is reserved for patients with recurrent episodes of diverticulitis, complications or severe attacks when there's little or no response to medication. Surgery may also be required in individuals with a single episode of severe bleeding from diverticulosis or with recurrent episodes of bleeding.  Surgical treatment for diverticulitis removes the diseased part of the colon, most commonly, the left or sigmoid colon. Often the colon is hooked up or "anastomosed" again to the rectum. Complete recovery can be expected. Normal bowel function usually resumes in about three weeks. In emergency surgeries, patients may require a temporary colostomy bag. Patients are encouraged to seek medical attention for abdominal symptoms early to help avoid complications.   What is a colon and rectal surgeon? Colon and rectal surgeons are experts in the surgical and non-surgical treatment of diseases of the colon, rectum and anus. They have completed advanced surgical training in the treatment of these diseases as well as full general surgical  training. Board-certified colon and rectal surgeons complete residencies in general surgery and colon and rectal surgery, and pass intensive examinations conducted by the American Board of Surgery and the American Board of Colon and Rectal Surgery. They are well-versed in the treatment of both benign and malignant diseases of the colon, rectum and anus and are able to perform routine screening examinations and surgically treat conditions if indicated to do so.  #############################################  ##############################################################################################  STOP SMOKING!  We strongly  recommend that you stop smoking. Smoking increases the risk of surgery including infection in the form of an open wound, pus formation, abscess, hernia at an incision on the abdomen, etc. You have an increased risk of other MAJOR complications such as stroke, heart attack, forming clots in the leg and/or lungs, and death.   Smoking Cessation Quitting smoking is important to your health and has many advantages. However, it is not always easy to quit since nicotine is a very addictive drug. Often times, people try 3 times or more before being able to quit. This document explains the best ways for you to prepare to quit smoking. Quitting takes hard work and a lot of effort, but you can do it.  ADVANTAGES OF QUITTING SMOKING ? You will live longer, feel better, and live better. ? Your body will feel the impact of quitting smoking almost immediately. ? Within 20 minutes, blood pressure decreases. Your pulse returns to its normal level. ? After 8 hours, carbon monoxide levels in the blood return to normal. Your oxygen level increases. ? After 24 hours, the chance of having a heart attack starts to decrease. Your breath, hair, and body stop smelling like smoke. ? After 48 hours, damaged nerve endings begin to recover. Your sense of taste and smell improve. ? After 72 hours, the  body is virtually free of nicotine. Your bronchial tubes relax and breathing becomes easier. ? After 2 to 12 weeks, lungs can hold more air. Exercise becomes easier and circulation improves. ? The risk of having a heart attack, stroke, cancer, or lung disease is greatly reduced. ? After 1 year, the risk of coronary heart disease is cut in half. ? After 5 years, the risk of stroke falls to the same as a nonsmoker. ? After 10 years, the risk of lung cancer is cut in half and the risk of other cancers decreases significantly. ? After 15 years, the risk of coronary heart disease drops, usually to the level of a nonsmoker. ? If you are pregnant, quitting smoking will improve your chances of having a healthy baby. ? The people you live with, especially any children, will be healthier. ? You will have extra money to spend on things other than cigarettes.  QUESTIONS TO THINK ABOUT BEFORE ATTEMPTING TO QUIT You may want to talk about your answers with your caregiver. ? Why do you want to quit? ? If you tried to quit in the past, what helped and what did not? ? What will be the most difficult situations for you after you quit? How will you plan to handle them? ? Who can help you through the tough times? Your family? Friends? A caregiver? ? What pleasures do you get from smoking? What ways can you still get pleasure if you quit? Here are some questions to ask your caregiver: ? How can you help me to be successful at quitting? ? What medicine do you think would be best for me and how should I take it? ? What should I do if I need more help? ? What is smoking withdrawal like? How can I get information on withdrawal?  GET READY ? Set a quit date. ? Change your environment by getting rid of all cigarettes, ashtrays, matches, and lighters in your home, car, or work. Do not let people smoke in your home. ? Review your past attempts to quit. Think about what worked and what did not.  GET SUPPORT AND  ENCOURAGEMENT You have a better chance of  being successful if you have help. You can get support in many ways. ? Tell your family, friends, and co-workers that you are going to quit and need their support. Ask them not to smoke around you. ? Get individual, group, or telephone counseling and support. Programs are available at [employer] and health centers. Call your local health department for information about programs in your area. ? Spiritual beliefs and practices may help some smokers quit. ? Download a "quit meter" on your computer to keep track of quit statistics, such as how long you have gone without smoking, cigarettes not smoked, and money saved. ? Get a self-help book about quitting smoking and staying off of tobacco.  [place] ? Distract yourself from urges to smoke. Talk to someone, go for a walk, or occupy your time with a task. ? Change your normal routine. Take a different route to work. Drink tea instead of coffee. Eat breakfast in a different place. ? Reduce your stress. Take a hot bath, exercise, or read a book. ? Plan something enjoyable to do every day. Reward yourself for not smoking. ? Explore interactive web-based programs that specialize in helping you quit.  GET MEDICINE AND USE IT CORRECTLY Medicines can help you stop smoking and decrease the urge to smoke. Combining medicine with the above behavioral methods and support can greatly increase your chances of successfully quitting smoking. ? Nicotine replacement therapy helps deliver nicotine to your body without the negative effects and risks of smoking. Nicotine replacement therapy includes nicotine gum, lozenges, inhalers, nasal sprays, and skin patches. Some may be available over-the-counter and others require a prescription. ? Antidepressant medicine helps people abstain from smoking, but how this works is unknown. This medicine is available by prescription. ? Nicotinic receptor partial  agonist medicine simulates the effect of nicotine in your brain. This medicine is available by prescription. Ask your caregiver for advice about which medicines to use and how to use them based on your health history. Your caregiver will tell you what side effects to look out for if you choose to be on a medicine or therapy. Carefully read the information on the package. Do not use any other product containing nicotine while using a nicotine replacement product.   RELAPSE OR DIFFICULT SITUATIONS Most relapses occur within the first 3 months after quitting. Do not be discouraged if you start smoking again. Remember, most people try several times before finally quitting. You may have symptoms of withdrawal because your body is used to nicotine. You may crave cigarettes, be irritable, feel very hungry, cough often, get headaches, or have difficulty concentrating. The withdrawal symptoms are only temporary. They are strongest when you first quit, but they will go away within 10 14 days. To reduce the chances of relapse, try to: ? Avoid drinking alcohol. Drinking lowers your chances of successfully quitting. ? Reduce the amount of caffeine you consume. Once you quit smoking, the amount of caffeine in your body increases and can give you symptoms, such as a rapid heartbeat, sweating, and anxiety. ? Avoid smokers because they can make you want to smoke. ? Do not let weight gain distract you. Many smokers will gain weight when they quit, usually less than 10 pounds. Eat a healthy diet and stay active. You can always lose the weight gained after you quit. ? Find ways to improve your mood other than smoking.  FOR MORE INFORMATION  www.smokefree.gov   While it can be one of the most  difficult things to do, the [place] community has programs to help you stop. Consider talking with your primary care physician about options. Also, Smoking Cessation classes are available through the [facility]:  The smoking  cessation program is a proven-effective program from the American Lung Association. The program is available for anyone 67 and older who currently smokes. The program lasts for 7 weeks and is 8 sessions. Each class will be approximately 1 1/2 hours. The program is every Tuesday. All classes are 12-1:30pm and same location.  Event Location Information:  Location: [facility] 2nd Floor Conference Room 2-037; located next to [facility] cross streets: [place] Entrance into the [facility] is adjacent to the [employer] main entrance. The conference room is located on the 2nd floor.  Parking Instructions: Visitor parking is adjacent to [employer] main entrance and the [place]  A smoking cessation program is also offered through the [facility]. Register online at [email or web address] or call [phone] for more information.  Tobacco cessation counseling is available at [facility]. Call [phone] for a free appointment.  Tobacco cessation classes also are available through the [facility] in [place]. For information, call [phone].  The Patient Education Network features videos on tobacco cessation. Please consult your listings in the center of this book to find instructions on how to access this resource.   ################################################################################################  #########################################################################################################################################################  SURGERY: POST OPERATIVE INSTRUCTIONS (Surgery for small bowel obstruction, colon resection, etc)  #########################################################################################################################################################  EAT Gradually transition to a  high fiber diet with a fiber supplement over the next few days after discharge  WALK Walk an hour a day. Control your pain to do that.   CONTROL PAIN Control pain so that you can walk, sleep, tolerate sneezing/coughing, go up/down stairs.  HAVE A BOWEL MOVEMENT DAILY Keep your bowels regular to avoid problems. OK to try a laxative to override constipation. OK to use an antidairrheal to slow down diarrhea. Call if not better after 2 tries  CALL IF YOU HAVE PROBLEMS/CONCERNS Call if you are still struggling despite following these instructions. Call if you have concerns not answered by these instructions  #########################################################################################################################################################  DIET Follow a light diet the first few days at home. Start with a bland diet such as soups, liquids, starchy foods, low fat foods, etc. If you feel full, bloated, or constipated, stay on a ful liquid or pureed/blenderized diet for a few days until you feel better and no longer constipated. Be sure to drink plenty of fluids every day to avoid getting dehydrated (feeling dizzy, not urinating, etc.). Gradually add a fiber supplement to your diet over the next week. Gradually get back to a regular solid diet. Avoid fast food or heavy meals the first week as you are more likely to get nauseated. It is expected for your digestive tract to need a few months to get back to normal. It is common for your bowel movements and stools to be irregular. You will have occasional bloating and cramping that should eventually fade away. Until you are eating solid food normally, off all pain medications, and back to regular activities; your bowels will not be normal. Focus on eating a low-fat, high fiber diet the rest of your life (See Getting to [place], below).  CARE of your INCISION or WOUND It is good for closed incision and even open wounds to be  washed every day. Shower every day. Short baths are fine. Wash the incisions and wounds clean with soap & water.   If you have a closed incision(s), wash the incision with soap & water every day. You may leave closed incisions open to air if it is dry. You may cover the incision with clean gauze & replace it after your daily shower for comfort.  It is good for closed incisions and even open wounds to be washed every day. Shower every day. Short baths are fine. Wash the incisions and wounds clean with soap & water.  You may leave closed incisions open to air if it is dry. You may cover the incision with clean gauze & replace it after your daily shower for comfort.  TEGADERM: You have clear gauze band-aid dressings over your closed incision(s). Remove the dressings 3 days after surgery.  If you have an open wound with a wound vac, see wound vac care instructions.  ACTIVITIES as tolerated Start light daily activities --- self-care, walking, climbing stairs-- beginning the day after surgery. Gradually increase activities as tolerated. Control your pain to be active. Stop when you are tired. Ideally, walk several times a day, eventually an hour a day.  Most people are back to most day-to-day activities in a few weeks. It takes 4-8 weeks to get back to unrestricted, intense activity. If you can walk 30 minutes without difficulty, it is safe to try more intense activity such as jogging, treadmill, bicycling, low-impact aerobics, swimming, etc. Save the most intensive and strenuous activity for last (Usually 4-8 weeks after surgery) such as sit-ups, heavy lifting, contact sports, etc. Refrain from any intense heavy lifting or straining until you are off narcotics for pain control. You will have off days, but things should improve week-by-week.  DO NOT PUSH THROUGH PAIN. Let pain be your guide: If it hurts to do something, don't do it. Pain is your body warning you to avoid that activity for another week  until the pain goes down. You may drive when you are no longer taking narcotic prescription pain medication, you can comfortably wear a seatbelt, and you can safely make sudden turns/stops to protect yourself without hesitating due to pain. You may have sexual intercourse when it is comfortable. If it hurts to do something, stop.  MEDICATIONS Take your usually prescribed home medications unless otherwise directed.  Blood thinners:  Usually you can restart any strong blood thinners after the second postoperative day. It is OK to take aspirin right away.  If you are on strong blood thinners (warfarin/Coumadin, Plavix, Xerelto, Eliquis, Pradaxa, etc), discuss with your surgeon, medicine PCP, and/or cardiologist for instructions on when to restart the blood thinner & if blood monitoring is needed (PT/INR blood check, etc).   PAIN CONTROL Pain after surgery or related to activity is often due to strain/injury to muscle, tendon, nerves and/or incisions. This pain is usually short-term and will improve in a few months.  To help speed the process of healing and to get back to regular activity more quickly, DO THE FOLLOWING THINGS TOGETHER: 1. Increase activity gradually. DO NOT PUSH THROUGH PAIN 2. Use Ice and/or Heat 3. Try Gentle Massage and/or Stretching 4. Take over the counter pain medication 5. Take Narcotic prescription pain medication for more severe pain  Good pain control = faster recovery. It is better to take more medicine to be more active than to stay in bed all day to avoid medications. 1. Increase activity gradually Avoid heavy lifting  at first, then increase to lifting as tolerated over the next 6 weeks. Do not "push through" the pain. Listen to your body and avoid positions and maneuvers than reproduce the pain. Wait a few days before trying something more intense Walking an hour a day is encouraged to help your body recover faster and more safely. Start slowly and stop when getting  sore. If you can walk 30 minutes without stopping or pain, you can try more intense activity (running, jogging, aerobics, cycling, swimming, treadmill, sex, sports, weightlifting, etc.) Remember: If it hurts to do it, then don't do it! 2. Use Ice and/or Heat You will have swelling and bruising around the incisions. This will take several weeks to resolve. Ice packs or heating pads (6-8 times a day, 30-60 minutes at a time) will help sooth soreness & bruising. Some people prefer to use ice alone, heat alone, or alternate between ice & heat. Experiment and see what works best for you. Consider trying ice for the first few days to help decrease swelling and bruising; then, switch to heat to help relax sore spots and speed recovery. Shower every day. Short baths are fine. It feels good! Keep the incisions and wounds clean with soap & water.  3. Try Gentle Massage and/or Stretching Massage at the area of pain many times a day Stop if you feel pain - do not overdo it 4. Take over the counter pain medication This helps the muscle and nerve tissues become less irritable and calm down faster Choose ONE of the following over-the-counter anti-inflammatory medications: Acetaminophen 500mg  tabs (Tylenol) 1-2 pills with every meal and just before bedtime (avoid if you have liver problems or if you have acetaminophen in you narcotic prescription) Naproxen 220mg  tabs (ex. Aleve, Naprosyn) 1-2 pills twice a day (avoid if you have kidney, stomach, IBD, or bleeding problems) Ibuprofen 200mg  tabs (ex. Advil, Motrin) 3-4 pills with every meal and just before bedtime (avoid if you have kidney, stomach, IBD, or bleeding problems) Take with food/snack several times a day as directed for at least 2 weeks to help keep pain / soreness down & more manageable. 5. Take Narcotic prescription pain medication for more severe pain A prescription for strong pain control is often given to you upon discharge (for example:  oxycodone/Percocet, hydrocodone/Norco/Vicodin, or tramadol/Ultram) Take your pain medication as prescribed. Be mindful that most narcotic prescriptions contain Tylenol (acetaminophen) as well - avoid taking too much Tylenol. If you are having problems/concerns with the prescription medicine (does not control pain, nausea, vomiting, rash, itching, etc.), please call us [phone] to see if we need to switch you to a different pain medicine that will work better for you and/or control your side effects better. If you need a refill on your pain medication, you must call the office before 4 pm and on weekdays only. By federal law, prescriptions for narcotics cannot be called into a pharmacy. They must be filled out on paper & picked up from our office by the patient or authorized caretaker. Prescriptions cannot be filled after 4 pm nor on weekends.   WHEN TO CALL us [phone] Severe uncontrolled or worsening pain  Fever over 101 F (38.5 C) Concerns with the incision: Worsening pain, redness, rash/hives, swelling, bleeding, or drainage Reactions / problems with new medications (itching, rash, hives, nausea, etc.) Nausea and/or vomiting Difficulty urinating Difficulty breathing Worsening fatigue, dizziness, lightheadedness, blurred vision Other concerns  If you are not getting better after two weeks or are noticing  you are getting worse, contact our office [phone] for further advice. We may need to adjust your medications, re-evaluate you in the office, send you to the emergency room, or see what other things we can do to help. The clinic staff is available to answer your questions during regular business hours (8:30am-5pm). Please don't hesitate to call and ask to speak to one of our nurses for clinical concerns.   A surgeon from [facility] is always on call at the hospitals 24 hours/day If you have a medical emergency, go to the nearest emergency room or call  911.  FOLLOW UP in our office One the day of your discharge from the hospital (or the next business weekday), please call [facility] to set up or confirm an appointment to see your surgeon in the office for a follow-up appointment. Usually it is 2-3 weeks after your surgery.  If you have skin staples at your incision(s), let the office know so we can set up a time in the office for the nurse to remove them (usually around 10 days after surgery). Make sure that you call for appointments the day of discharge (or the next business weekday) from the hospital to ensure a convenient appointment time. IF YOU HAVE DISABILITY OR FAMILY LEAVE FORMS, BRING THEM TO THE OFFICE FOR PROCESSING. DO NOT GIVE THEM TO YOUR DOCTOR.  [facility] [address], [place], [place], [place] [address] ? [phone] - Main [phone] - [place], [phone] - Fax [email or web address]  GETTING TO GOOD BOWEL HEALTH.  It is expected for your digestive tract to need a few months to get back to normal. It is common for your bowel movements and stools to be irregular. You will have occasional bloating and cramping that should eventually fade away. Until you are eating solid food normally, off all pain medications, and back to regular activities; your bowels will not be normal.  Avoiding constipation The goal: ONE SOFT BOWEL MOVEMENT A DAY!   Drink plenty of fluids. Choose water first.  TAKE A FIBER SUPPLEMENT EVERY DAY THE REST OF YOUR LIFE  During your first week back home, gradually add back a fiber supplement every day  Experiment which form you can tolerate. There are many forms such as powders, tablets, wafers, gummies, etc. Psyllium bran (Metamucil), methylcellulose (Citrucel), Miralax or Glycolax, Benefiber, Flax Seed.   Adjust the dose week-by-week (1/2 dose/day to 6 doses a day) until you are moving your bowels 1-2 times a day. Cut back the dose or try a  different fiber product if it is giving you problems such as diarrhea or bloating.  Sometimes a laxative is needed to help jump-start bowels if constipated until the fiber supplement can help regulate your bowels. If you are tolerating eating & you are farting, it is okay to try a gentle laxative such as double dose MiraLax, prune juice, or Milk of Magnesia. Avoid using laxatives too often.  Stool softeners can sometimes help counteract the constipating effects of narcotic pain medicines. It can also cause diarrhea, so avoid using for too long. If you are still constipated despite taking fiber daily, eating solids, and a few doses of laxatives, call our office.  Controlling diarrhea Try drinking liquids and eating bland foods for a few days to avoid stressing your intestines further. Avoid dairy products (especially milk & ice cream) for a short time. The intestines often can lose the ability to digest lactose when stressed. Avoid  foods that cause gassiness or bloating. Typical foods include beans and other legumes, cabbage, broccoli, and dairy foods. Avoid greasy, spicy, fast foods. Every person has some sensitivity to other foods, so listen to your body and avoid those foods that trigger problems for you. Probiotics (such as active yogurt, Align, etc) may help repopulate the intestines and colon with normal bacteria and calm down a sensitive digestive tract Adding a fiber supplement gradually can help thicken stools by absorbing excess fluid and retrain the intestines to act more normally. Slowly increase the dose over a few weeks. Too much fiber too soon can backfire and cause cramping & bloating. It is okay to try and slow down diarrhea with a few doses of antidiarrheal medicines.  Bismuth subsalicylate (ex. Kayopectate, Pepto Bismol) for a few doses can help control diarrhea. Avoid if pregnant.  Loperamide (Imodium) can slow down diarrhea. Start with one tablet (2mg ) first. Avoid if you are  having fevers or severe pain.   ILEOSTOMY PATIENTS WILL HAVE CHRONIC DIARRHEA since their colon is not in use.  Drink plenty of liquids. You will need to drink even more glasses of water/liquid a day to avoid getting dehydrated.  Record output from your ileostomy. Expect to empty the bag every 3-4 hours at first. Most people with a permanent ileostomy empty their bag 4-6 times at the least.  Use antidiarrheal medicine (especially Imodium) several times a day to avoid getting dehydrated. Start with a dose at bedtime & breakfast. Adjust up or down as needed. Increase antidiarrheal medications as directed to avoid emptying the bag more than 8 times a day (every 3 hours).  Work with your wound ostomy nurse to learn care for your ostomy. See ostomy care instructions.  TROUBLESHOOTING IRREGULAR BOWELS 1) Start with a soft & bland diet. No spicy, greasy, or fried foods.  2) Avoid gluten/wheat or dairy products from diet to see if symptoms improve. 3) Miralax 17gm or flax seed mixed in [place]. water or juice-daily. May use 2-4 times a day as needed. 4) Gas-X, Phazyme, etc. as needed for gas & bloating.  5) Prilosec (omeprazole) over-the-counter as needed 6) Consider probiotics (Align, Activa, etc) to help calm the bowels down  Call your doctor if you are getting worse or not getting better. Sometimes further testing (cultures, endoscopy, X-ray studies, CT scans, bloodwork, etc.) may be needed to help diagnose and treat the cause of the diarrhea. [facility], [place], [place], [place], [place] [address] [phone] - Main.  [phone] - Toll Free. [phone] - Fax [email or web address]  #########################################################################################################################################################     Electronically signed by Hollace Kinnier, MD at 05/09/2021 3:56 PM EST   Progress Notes - documented  in this encounter Gross, Adrian Saran, MD - 05/09/2021 3:15 PM EST Formatting of this note is different from the original. Images from the original note were not included.   REFERRING PHYSICIAN: Plotnikov, Evie Lacks, MD  Patient Care Team: Cassandria Anger, MD as PCP - General (Internal Medicine) Milus Banister, MD (Gastroenterology)  PROVIDER: Hollace Kinnier, MD  [facility] MRN: [id number] DOB: Feb 17, 1961 DATE OF ENCOUNTER: 05/09/2021  SUBJECTIVE   Chief Complaint: Diverticulitis   History of Present Illness: Tamara Charles is a 60 y.o. female who is seen today  as an office consultation at the request of Dr. Alain Marion  for evaluation of Diverticulitis .   Former smoker. History of spontaneous blebs in pneumothoraxes. No major issues in the past decade. Has had episodes  of left lower quadrant abdominal pain with at least 1 CAT scan confirming diverticulitis. Usually managed with oral antibiotic regimens. However she has issues with most antibiotics now. Saw gastroenterology. Dr. Ardis Hughs in 2020. Had discussion about diet adjustment and recommended screening colonoscopy. As far as I can tell that did not happen. She recalls having an underwhelming colonoscopy when she lived in [place] about 15 years ago. Records not available.  She had another episode of diverticulitis this year. Discussed with her primary care physician. He recommended considering surgical consultation. Patient is a history of UTIs as well. She is never needed to be hospitalized from. However she has had a lot of issues with oral antibiotics. Augmentin seems to work but gives her diarrhea. Had a rash on cefdinir/Flagyl. Cannot tolerate Bactrim nor Cipro.  Patient here with her niece. She recalls having attacks for many years. I see CAT scans from 2019, 2021, 2022 for diverticulitis. She smokes less than a pack a day. Has quit for up to a 3 years but is back on it. She walks an hour with her niece many times a  week without issues. No exertional chest pain or shortness of breath. 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 No issues since. No sleep apnea. No diabetes. She had a hysterectomy in the distant past but nothing recently. She is just hoping to getting back to eating pecan pie. She is try to avoid nuts and seeds even though she was reassured by her gastrologist it was safe to consume. Does not recall any specific food triggering her episodes/attacks.  Medical History:  Past Medical History:  Diagnosis Date   Hypertension   Patient Active Problem List  Diagnosis   Diverticulitis of sigmoid colon   Tobacco abuse   Past Surgical History:  Procedure Laterality Date   HYSTERECTOMY   lung surgery N/A    Allergies  Allergen Reactions   Amoxicillin-Pot Clavulanate Other (See Comments)  Some diarrhea. No rash. Able to tolerate   Ciprofloxacin Unknown  Sun rash   Sulfamethoxazole-Trimethoprim Nausea   Cefdinir Rash  Was taking with flagyl, developed rash   Metronidazole Rash  Nausea - needs anti-nausea Rx w/it  Took with cefdinir and developed rash   Current Outpatient Medications on File Prior to Visit  Medication Sig Dispense Refill   cholecalciferol (VITAMIN D3) 2,000 unit capsule Take by mouth   nortriptyline (PAMELOR) 10 MG capsule Take 1 capsule (10 mg total) by mouth at bedtime   ondansetron (ZOFRAN-ODT) 4 MG disintegrating tablet Take 1 tablet (4 mg total) by mouth every 8 (eight) hours as needed   valACYclovir (VALTREX) 500 MG tablet Take 1 tablet (500 mg total) by mouth at bedtime   calcium carbonate (TUMS) 200 mg calcium (500 mg) chewable tablet Take by mouth   cefdinir (OMNICEF) 300 mg capsule Take 1 capsule (300 mg total) by mouth 2 (two) times daily   meloxicam (MOBIC) 15 MG tablet   metroNIDAZOLE (FLAGYL) 500 MG tablet Take 1 tablet (500 mg total) by mouth 2 (two) times daily   naproxen sodium (ALEVE) 220 MG tablet Take by mouth    psyllium, aspartame, (METAMUCIL) 3.4 gram packet Take by mouth   trimethoprim 100 mg tablet   No current facility-administered medications on file prior to visit.   History reviewed. No pertinent family history.   Social History   Tobacco Use  Smoking Status Never  Smokeless Tobacco Never    Social History   Socioeconomic  History   Marital status: Married  Tobacco Use   Smoking status: Never   Smokeless tobacco: Never  Vaping Use   Vaping Use: Never used  Substance and Sexual Activity   Alcohol use: Yes   Drug use: Never   ############################################################  Review of Systems: A complete review of systems (ROS) was obtained from the patient. I have reviewed this information and discussed as appropriate with the patient. See HPI as well for other pertinent ROS.  Constitutional: No fevers, chills, sweats. Weight stable Eyes: No vision changes, No discharge HENT: No sore throats, nasal drainage Lymph: No neck swelling, No bruising easily Pulmonary: No cough, productive sputum CV: No orthopnea, PND Patient walks 60 minutes for about 3 miles without difficulty. No exertional chest/neck/shoulder/arm pain.  GI: No personal nor family history of GI/colon cancer, inflammatory bowel disease, irritable bowel syndrome, allergy such as Celiac Sprue, dietary/dairy problems, colitis, ulcers nor gastritis. No recent sick contacts/gastroenteritis. No travel outside the country. No changes in diet.  Renal: No UTIs, No hematuria Genital: No drainage, bleeding, masses Musculoskeletal: No severe joint pain. Good ROM major joints Skin: No sores or lesions Heme/Lymph: No easy bleeding. No swollen lymph nodes  OBJECTIVE   Vitals:  05/09/21 1526  BP: (!) 138/90  Pulse: 84  Temp: 36.9 C (98.4 F)  SpO2: 96%  Weight: 75.9 kg (167 lb 6.4 oz)  Height: 170.2 cm (5\' 7" )    Body mass index is 26.22 kg/m.  PHYSICAL EXAM:  Constitutional: Not cachectic.  Hygeine adequate. Vitals signs as above.  Eyes: Pupils reactive, normal extraocular movements. Sclera nonicteric Neuro: CN II-XII intact. No major focal sensory defects. No major motor deficits. Lymph: No head/neck/groin lymphadenopathy Psych: No severe agitation. No severe anxiety. Judgment & insight Adequate, Oriented x4, HENT: Normocephalic, Mucus membranes moist. No thrush.  Neck: Supple, No tracheal deviation. No obvious thyromegaly Chest: No pain to chest wall compression. Good respiratory excursion. No audible wheezing CV: Pulses intact. Regular rhythm. No major extremity edema Ext: No obvious deformity or contracture. Edema: Not present. No cyanosis Skin: No major subcutaneous nodules. Warm and dry Musculoskeletal: Severe joint rigidity not present. No obvious clubbing. No digital petechiae.   Abdomen:  Flat Hernia: Not present. Diastasis recti: Not present. Soft. Nondistended. Nontender. Some mild soreness along left lower quadrant/flank. No guarding. No peritonitis no hepatomegaly. No splenomegaly  Genital/Pelvic: Inguinal hernia: Not present. Inguinal lymph nodes: without lymphadenopathy.   Rectal: (Deferred)    ###################################################################  Labs, Imaging and Diagnostic Testing:  Located in 'Care Everywhere' section of Epic EMR chart  PRIOR [facility] NOTES:  Not applicable  SURGERY NOTES:  Not applicable  PATHOLOGY:  Not applicable  Assessment and Plan:  DIAGNOSES:  Diagnoses and all orders for this visit:  Diverticulitis of sigmoid colon  Tobacco abuse  Recurrent UTI (urinary tract infection)    ASSESSMENT/PLAN  Pleasant smoking female with recurrent episodes of left lower quadrant pain and CT scan showing episodes of diverticulitis. One seem to be at the descending/sigmoid junction. Another one to my view seems to be more in the mid sigmoid. No evidence of any fistula to bladder or vagina even though  recurrent UTIs.  Think she has had repeated enough attacks to consider elective segmental resection. She is interested in the idea since she is only in her 56s and she is having attacks every year. 2 this year.  The anatomy & physiology of the digestive tract was discussed. The pathophysiology of the colon was discussed. Natural history risks  without surgery was discussed. I feel the risks of no intervention will lead to serious problems that outweigh the operative risks; therefore, I recommended a partial colectomy to remove the pathology. Minimally invasive (Robotic/Laparoscopic) & open techniques were discussed.   Risks such as bleeding, infection, abscess, leak, reoperation, injury to other organs, need for repair of tissues / organs, possible ostomy, hernia, heart attack, stroke, death, and other risks were discussed. I noted a good likelihood this will help address the problem. Goals of post-operative recovery were discussed as well. Need for adequate nutrition, daily bowel regimen and healthy physical activity, to optimize recovery was noted as well. We will work to minimize complications. Educational materials were available as well. Questions were answered. The patient expresses understanding & wishes to proceed with surgery.  She is overdue for colonoscopy. Had a been recommended in 2020. See if we can coordinate this to get it done the day before surgery, that way the patient just gets 1 bowel prep. We will try and get this done in February when she returns from her travels. She is not having any severe symptoms right now so probably can wait an extra month  I strong recommend she quit smoking. STOP SMOKING! We talked to the patient about the dangers of smoking. We stressed that tobacco use dramatically increases the risk of peri-operative complications such as infection, tissue necrosis leaving to problems with incision/wound and organ healing, hernia, chronic pain, heart attack, stroke, DVT,  pulmonary embolism, and death. We noted there are programs in our community to help stop smoking. Information was available.  FOLLOWUP: Return for Plan to schedule surgery. See instructions.  The plan was discussed in detail with the patient today, who expressed understanding & appreciation. The patient has my contact information, and understands to call us with any additional questions or concerns in the interval. I would be happy to see the patient back sooner if the need arises.  Portions of this report may have been transcribed using voice recognition software. Every effort was made to ensure accuracy; however, inadvertent computerized transcription errors may be present. Any transcriptional errors that result from this process are unintentional.  ########################################################  Adin Hector, MD, FACS, MASCRS Esophageal, Gastrointestinal & Colorectal Surgery Robotic and Minimally Invasive Surgery  [facility], [place]  [place]. [address] [place], [place] [address] [phone] Fax [phone] Main      05/09/2021   Electronically signed by Hollace Kinnier, MD at 05/09/2021 4:08 PM EST  Plan of Treatment - documented as of this encounter Plan of Treatment - Scheduled Referrals Scheduled Referrals Name Type Priority Associated Diagnoses Order Schedule  Ambulatory Referral to Gastroenterology Outpatient Referral ASAP Diverticulitis of sigmoid colon   Tobacco abuse   Recurrent UTI (urinary tract infection)   Ordered: 05/12/2021   Visit Diagnoses - documented in this encounter Visit Diagnoses Diagnosis  Diverticulitis of sigmoid colon - Primary   Diverticulitis of colon (without mention of hemorrhage)    Tobacco abuse   Tobacco use disorder    Recurrent UTI (urinary tract infection)     Care Teams - documented as of this encounter Care Teams Team Member Relationship  Specialty Start Date End Date  Plotnikov, Evie Lacks, MD   [place], [place] [address]   [phone] (Work)   [phone] (Fax)   PCP - General Internal Medicine 04/13/21    Milus Banister, MD   [place], [place] [address]   [phone] (Work)   [phone] (Fax)     Gastroenterology 05/09/21     Images Patient Demographics  Patient Demographics Patient Address Communication Language Race / Ethnicity Marital Status  [address] (Home) [place], [place] [address]  Former (Oct 21, 2010 - Nov. 08, 2022): [address] [place]) [place], [place] [address] [phone] [facility]) [phone] (Home) [email or web address] English (Preferred) White / Not Hispanic or Latino Married   Patient Contacts  Patient Contacts Contact Name Contact Address Communication Relationship to Patient  Tamara Charles [address] [place], [place] [address] [phone] [facility]) [phone] (Work) [phone] (Home) Spouse, Emergency Contact   Document Information  Primary Care Provider Other Service Providers Document Coverage Dates  Cassandria Anger, MD (Nov. 09, 2022November 09, 2022 - Present) [phone] (Work) [phone] (Fax) [place], [place] [address] Internal Medicine [facility] [place], [place] [address] Milus Banister, MD [phone] (Work) [phone] (Fax) [place], [place] [address] Gastroenterology [facility] [place], [place] [address] Dec. 05, 2022December 05, 2022   [place] [address] [place], [place] [address]   Encounter Providers Encounter Date  Hollace Kinnier, MD (Attending) [phone] (Work) [phone] (Fax) [address] [place], [place] [address] General Surgery Dec. 05, 2022December 05, 2022    Legal Authenticator   Associate Chief Health Information Officer     Show All Sections

## 2021-05-12 NOTE — Telephone Encounter (Signed)
Patient returning your call about appointment, please advise.

## 2021-05-12 NOTE — Telephone Encounter (Signed)
Left message on machine to call back   Appt made to see Dr Ardis Hughs on 06/15/21 at 75 am with Dr Chalmers Cater, Melene Plan, MD  Michael Boston, MD; Timothy Lasso, RN Richardson Landry,  Can't promise this is done exactly the day before her surgery but we will certainly try. She cancelled a screening colonoscopy in 2020 and we haven't heard from her since then.   Tullio Chausse,  Can you reach out to her. She needs OV with myself or any extender to [facility] and set up for colonoscopy   Thanks all        Previous Messages Referral Summary 05/12/2021 Referral Summary     Tamara Charles - 60 y.o. Female; born [birth year]Referral Summary, generated on Dec. 08, 2022December 08, 2022  Reason for Referral  Consultation (Urgent) - Pending Review Reason for Referral - Consultation (Urgent) - Pending Review Specialty Diagnoses / Procedures Referred By Contact Referred To Contact  Gastroenterology Diagnoses   Diverticulitis of sigmoid colon   Tobacco abuse   Recurrent UTI (urinary tract infection)    Gross, Adrian Saran, MD   [address]   [place]   [place], [place] [address]   Phone: [phone]   Fax: [phone]   Milus Banister, MD   [address]   [place], [place] [address]   Phone: [phone]   Fax: [phone]     Reason for Referral - Consultation (Urgent) - Pending Review Referral ID Status Reason Start Date Expiration Date Visits Requested Visits Authorized  [id number] Pending Review   05/12/2021 05/12/2022 1 1   Reason for Referral - Consultation (Urgent) - Pending Review Comments  We need to have colonoscopy set up for 07/14/21 for Diverticulitis surgery to be done on 07/15/21 by Dr Johney Maine   Jiles Harold surgery scheduler has reached out to your office but was told that we could not schedule the colonoscopy for 07/14/21 b/c pt has not been seen by your office. We need to please have this worked out so the patient can be taken care of  for the sake of only having to do one bowel prep for colonoscopy/sx date.      Electronically signed by Hollace Kinnier MD at 05/12/2021 9:40 AM EST   Reason for Visit  Reason for Visit -  Reason Comments  Diverticulitis     Encounter Details  Encounter Details Date Type Department Care Team Description  05/09/2021 Initial consult [facility]   [place], [place] [address]   [place], [place]   [place], [place] [address]   [phone] (Work)   [phone] (Fax)     Johney Maine, Adrian Saran, MD   [address]   [place]   [place], [place] [address]   [phone] (Work)   [phone] (Fax)   Diverticulitis of sigmoid colon (Primary Dx);  Tobacco abuse;  Recurrent UTI (urinary tract infection)   Allergies - documented as of this encounter (statuses as of 05/12/2021) Reconcile with Patient's Chart Allergies Active Allergy Reactions Severity Noted Date Comments  Amoxicillin-Pot Clavulanate Other (See Comments)   12/05/2018 Some diarrhea. No rash. Able to tolerate    Cefdinir Rash Low 03/21/2021 Was taking with flagyl, developed rash    Ciprofloxacin Unknown   12/05/2018 Sun rash    Metronidazole Rash Low 12/05/2018 Nausea -  needs anti-nausea Rx w/it      Took with cefdinir and developed rash    Sulfamethoxazole-Trimethoprim Nausea   11/26/2019     Medications - documented as of this encounter (statuses as of 05/12/2021) Reconcile with Patient's Chart Medications Medication Sig Dispensed Refills Start Date End Date Status  valACYclovir (VALTREX) 500 MG tablet   Take 1 tablet (500 mg total) by mouth at bedtime   0 04/28/2021   Active  trimethoprim 100 mg tablet       0 04/12/2021   Active  naproxen sodium (ALEVE) 220 MG tablet   Take by mouth   0     Active  nortriptyline (PAMELOR) 10 MG capsule   Take 1 capsule (10 mg total) by mouth at bedtime   0 11/30/2020   Active  ondansetron  (ZOFRAN-ODT) 4 MG disintegrating tablet   Take 1 tablet (4 mg total) by mouth every 8 (eight) hours as needed   0 03/17/2021   Active  psyllium, aspartame, (METAMUCIL) 3.4 gram packet   Take by mouth   0     Active  calcium carbonate (TUMS) 200 mg calcium (500 mg) chewable tablet   Take by mouth   0     Active  cefdinir (OMNICEF) 300 mg capsule   Take 1 capsule (300 mg total) by mouth 2 (two) times daily   0 03/17/2021   Active  cholecalciferol (VITAMIN D3) 2,000 unit capsule   Take by mouth   0 07/09/2019   Active  meloxicam (MOBIC) 15 MG tablet       0 04/12/2021   Active  metroNIDAZOLE (FLAGYL) 500 MG tablet   Take 1 tablet (500 mg total) by mouth 2 (two) times daily   0 03/17/2021   Active   Active Problems - documented as of this encounter (statuses as of 05/12/2021) Reconcile with Patient's Chart Active Problems Problem Noted Date  Diverticulitis of sigmoid colon 05/09/2021  Tobacco abuse 05/09/2021   Social History - documented as of this encounter Social History Tobacco Use Types Packs/Day Years Used Date  Smoking Tobacco: Never          Smokeless Tobacco: Never           Social History Tobacco Cessation: Counseling Given: Not Answered   Social History Alcohol Use Standard Drinks/Week Comments  Yes 0 (1 standard drink = 0.6 oz pure alcohol)     Social History Sex Assigned at [employer] Date Recorded  Not on file     Social History COVID-19 Exposure Response Date Recorded  In the last 10 days, have you been in contact with someone who was confirmed or suspected to have Coronavirus/COVID-19? No / Unsure 05/09/2021 2:58 PM EST   Last Filed Vital Signs - documented in this encounter Last Filed Vital Signs Vital Sign Reading Time Taken Comments  Blood Pressure 138/90 05/09/2021 3:26 PM EST    Pulse 84 05/09/2021 3:26 PM EST    Temperature 36.9 C (98.4 F) 05/09/2021 3:26 PM EST    Respiratory Rate - -    Oxygen Saturation 96% 05/09/2021 3:26 PM EST    Inhaled Oxygen  Concentration - -    Weight 75.9 kg (167 lb 6.4 oz) 05/09/2021 3:26 PM EST    Height 170.2 cm (5\' 7" ) 05/09/2021 3:26 PM EST    Body Mass Index 26.22 05/09/2021 3:26 PM EST     Patient Instructions - documented in this encounter Patient Instructions Gross, Adrian Saran, MD - 05/09/2021 3:15 PM EST  Formatting of this note is different from the original. Images from the original note were not included. #############################################################  YOU ARE BEING SCHEDULED FOR SURGERY to robotically resect your descending/sigmoid colon where you get recurrent diverticulitis.   Ideally you get colonoscopy before surgery. Hopefully the day before by [facility] gastroenterology.  You should hear from our office's scheduling department by phone within 5 working days about the location, date, and time of surgery.  If you have not heard from our office in a week, call the office to request an update from your surgeon's nurse.  Our [facility] Surgery Schedulers work hard to meet patient's requests in scheduling surgery, but sometimes the Operating Room or the Surgeon's schedule prevent [place] from making ideal plans.  Please review instructions prior to your surgery (need for a bowel prep, need to hold any blood thinner medication, post-operative instructions, etc).   If you have any questions about your diagnosis, recommendations, preoperative instructions, plan for surgery, or other concerns; then, please call the office to seek advice from your surgeon's nurse.  [facility] [phone]  ####################################################################################  ####################################################################################  COLON BOWEL PREP  FIVE DAYS PRIOR TO YOUR SURGERY  Obtain supplies for the bowel prep at a pharmacy of your choice: Office-e-mailed prescriptions for your antibiotic pills (Neomycin & Metronidazole)  A  bottle of MiraLax / Glycolax (238g) - no prescription required  A large bottle of Gatorade / Powerade (64oz) - - no prescription required  Dulcolax tablets (4 tablets) - no prescription required   Change your diet to make the bowel prep go more easily: Switch to a bland, low fiber diet Stop eating any nuts, popcorn, or fruit with seeds.  Stop all fiber supplements such as Metamucil, Miralax, etc.   Improve nutrition: Consider drinking 2-3 nutritional shakes (Ex: Ensure Surgery) every day, starting 5 days prior to surgery  DAY PRIOR TO SURGERY   Switch to a liquid diet the day before surgery Drink plenty of liquids all day to avoid getting dehydrated  If you are having nausea or vomiting, hold the neomycin antibiotic & just take metronidazole tablets.  7:00am Swallow 4 Dulcolax tablets with some water  10:00am Mix the bottle of MiraLax with the 64-oz bottle of Gatorade.  Drink the Gatorade/Miralax mixture gradually (8oz glass every 15 minutes) until gone. (You should finish in 4 hours)  2:00pm Take 2 Neomycin 500mg  tablets & 2 Metronidazole 500mg  tablets  3:00pm Take 2 Neomycin 500mg  tablets & 2 Metronidazole 500mg  tablets  Drink plenty of clear liquids all evening to avoid getting dehydrated  10:00pm Take 2 Neomycin 500mg  tablets & 2 Metronidazole 500mg  tablets  Drink 2 Carbohydrate loading nutrition drinks (ex: Ensure Presurgery) Do not eat anything solid after bedtime (midnight) the night before your surgery.  BUT DO drink plenty of clear liquids (Water, Gatorade, juice, soda, coffee, tea, broths, etc.) up to 2 hours prior to surgery to avoid getting dehydrated.   MORNING OF SURGERY  Remember to not to eat anything solid that morning  Drink one final carbohydrate loading nutritional drink (ex: Ensure Presurgery) upon waking up in the morning Hold or take medications as recommended by the hospital staff at your Preoperative visit Stop drinking liquids before you leave the  house (>2 hours prior to surgery)  If you have questions or concerns, please call [facility] [phone] during business hours to speak to the clinical staff for advice.  ##########################################################################################    #############################  Diverticular Disease  Diverticulosis of the colon  is a common condition that afflicts about 50 percent of Americans by age 73 and nearly all by age 82. Only a small percentage of those with diverticulosis have symptoms, and even fewer will ever require surgery.   What is Diverticulosis/ Diverticulitis?  Diverticula are pockets that develop in the colon wall, usually in the sigmoid or left colon, but may involve the entire colon. Diverticulosis describes the presence of these pockets. Diverticulitis describes inflammation or complications of these pockets.   What are the symptoms of diverticular disease?  Uncomplicated diverticular disease is usually not associated with symptoms. Symptoms are related to complications of diverticular disease including diverticulits and bleeding. Diverticular disease is a common cause of significant bleeding from the colon.  Diverticulitis - an infection of the diverticula - may cause one or more of the following symptoms: pain in the abdomen, chills, fever and change in bowel habits. More intense symptoms are associated with serious complications such as perforation (rupture), abscess or fistula formation (an abnormal connection between the colon and another organ or the skin).  What is the cause of diverticular disease?  The cause of diverticulosis and diverticulitis is not precisely known, but it is more common for people with a low fiber diet. It is thought that a low-fiber diet over the years creates increased colon pressure and results in pockets or diverticula.   How is diverticular disease treated?  Increasing the amount of dietary fiber (grains,  legumes, vegetables, etc.) - and sometimes restricting certain foods reduces the pressure in the colon and may decrease the risk of complications due to diverticular disease.  Diverticulitis requires different management. Mild cases may be managed with oral antibiotics, dietary restrictions and possibly stool softeners. More severe cases require hospitalization with intravenous antibiotics and dietary restraints. Most acute attacks can be relieved with such methods.   When is surgery necessary?  Surgery is reserved for patients with recurrent episodes of diverticulitis, complications or severe attacks when there's little or no response to medication. Surgery may also be required in individuals with a single episode of severe bleeding from diverticulosis or with recurrent episodes of bleeding.  Surgical treatment for diverticulitis removes the diseased part of the colon, most commonly, the left or sigmoid colon. Often the colon is hooked up or "anastomosed" again to the rectum. Complete recovery can be expected. Normal bowel function usually resumes in about three weeks. In emergency surgeries, patients may require a temporary colostomy bag. Patients are encouraged to seek medical attention for abdominal symptoms early to help avoid complications.   What is a colon and rectal surgeon? Colon and rectal surgeons are experts in the surgical and non-surgical treatment of diseases of the colon, rectum and anus. They have completed advanced surgical training in the treatment of these diseases as well as full general surgical training. Board-certified colon and rectal surgeons complete residencies in general surgery and colon and rectal surgery, and pass intensive examinations conducted by the American Board of Surgery and the American Board of Colon and Rectal Surgery. They are well-versed in the treatment of both benign and malignant diseases of the colon, rectum and anus and are able to perform routine screening  examinations and surgically treat conditions if indicated to do so.  #############################################  ##############################################################################################  STOP SMOKING!  We strongly recommend that you stop smoking. Smoking increases the risk of surgery including infection in the form of an open wound, pus formation, abscess, hernia at an incision on the abdomen, etc. You have an increased risk of  other MAJOR complications such as stroke, heart attack, forming clots in the leg and/or lungs, and death.   Smoking Cessation Quitting smoking is important to your health and has many advantages. However, it is not always easy to quit since nicotine is a very addictive drug. Often times, people try 3 times or more before being able to quit. This document explains the best ways for you to prepare to quit smoking. Quitting takes hard work and a lot of effort, but you can do it.  ADVANTAGES OF QUITTING SMOKING ? You will live longer, feel better, and live better. ? Your body will feel the impact of quitting smoking almost immediately. ? Within 20 minutes, blood pressure decreases. Your pulse returns to its normal level. ? After 8 hours, carbon monoxide levels in the blood return to normal. Your oxygen level increases. ? After 24 hours, the chance of having a heart attack starts to decrease. Your breath, hair, and body stop smelling like smoke. ? After 48 hours, damaged nerve endings begin to recover. Your sense of taste and smell improve. ? After 72 hours, the body is virtually free of nicotine. Your bronchial tubes relax and breathing becomes easier. ? After 2 to 12 weeks, lungs can hold more air. Exercise becomes easier and circulation improves. ? The risk of having a heart attack, stroke, cancer, or lung disease is greatly reduced. ? After 1 year, the risk of coronary heart disease is cut in half. ? After 5 years, the risk of stroke falls to  the same as a nonsmoker. ? After 10 years, the risk of lung cancer is cut in half and the risk of other cancers decreases significantly. ? After 15 years, the risk of coronary heart disease drops, usually to the level of a nonsmoker. ? If you are pregnant, quitting smoking will improve your chances of having a healthy baby. ? The people you live with, especially any children, will be healthier. ? You will have extra money to spend on things other than cigarettes.  QUESTIONS TO THINK ABOUT BEFORE ATTEMPTING TO QUIT You may want to talk about your answers with your caregiver. ? Why do you want to quit? ? If you tried to quit in the past, what helped and what did not? ? What will be the most difficult situations for you after you quit? How will you plan to handle them? ? Who can help you through the tough times? Your family? Friends? A caregiver? ? What pleasures do you get from smoking? What ways can you still get pleasure if you quit? Here are some questions to ask your caregiver: ? How can you help me to be successful at quitting? ? What medicine do you think would be best for me and how should I take it? ? What should I do if I need more help? ? What is smoking withdrawal like? How can I get information on withdrawal?  GET READY ? Set a quit date. ? Change your environment by getting rid of all cigarettes, ashtrays, matches, and lighters in your home, car, or work. Do not let people smoke in your home. ? Review your past attempts to quit. Think about what worked and what did not.  GET SUPPORT AND ENCOURAGEMENT You have a better chance of being successful if you have help. You can get support in many ways. ? Tell your family, friends, and co-workers that you are going to quit and need their support. Ask them not to smoke around you. ?  Get individual, group, or telephone counseling and support. Programs are available at [employer] and health centers. Call your local health department  for information about programs in your area. ? Spiritual beliefs and practices may help some smokers quit. ? Download a "quit meter" on your computer to keep track of quit statistics, such as how long you have gone without smoking, cigarettes not smoked, and money saved. ? Get a self-help book about quitting smoking and staying off of tobacco.  [place] ? Distract yourself from urges to smoke. Talk to someone, go for a walk, or occupy your time with a task. ? Change your normal routine. Take a different route to work. Drink tea instead of coffee. Eat breakfast in a different place. ? Reduce your stress. Take a hot bath, exercise, or read a book. ? Plan something enjoyable to do every day. Reward yourself for not smoking. ? Explore interactive web-based programs that specialize in helping you quit.  GET MEDICINE AND USE IT CORRECTLY Medicines can help you stop smoking and decrease the urge to smoke. Combining medicine with the above behavioral methods and support can greatly increase your chances of successfully quitting smoking. ? Nicotine replacement therapy helps deliver nicotine to your body without the negative effects and risks of smoking. Nicotine replacement therapy includes nicotine gum, lozenges, inhalers, nasal sprays, and skin patches. Some may be available over-the-counter and others require a prescription. ? Antidepressant medicine helps people abstain from smoking, but how this works is unknown. This medicine is available by prescription. ? Nicotinic receptor partial agonist medicine simulates the effect of nicotine in your brain. This medicine is available by prescription. Ask your caregiver for advice about which medicines to use and how to use them based on your health history. Your caregiver will tell you what side effects to look out for if you choose to be on a medicine or therapy. Carefully read the information on the package. Do not use any other product  containing nicotine while using a nicotine replacement product.   RELAPSE OR DIFFICULT SITUATIONS Most relapses occur within the first 3 months after quitting. Do not be discouraged if you start smoking again. Remember, most people try several times before finally quitting. You may have symptoms of withdrawal because your body is used to nicotine. You may crave cigarettes, be irritable, feel very hungry, cough often, get headaches, or have difficulty concentrating. The withdrawal symptoms are only temporary. They are strongest when you first quit, but they will go away within 10 14 days. To reduce the chances of relapse, try to: ? Avoid drinking alcohol. Drinking lowers your chances of successfully quitting. ? Reduce the amount of caffeine you consume. Once you quit smoking, the amount of caffeine in your body increases and can give you symptoms, such as a rapid heartbeat, sweating, and anxiety. ? Avoid smokers because they can make you want to smoke. ? Do not let weight gain distract you. Many smokers will gain weight when they quit, usually less than 10 pounds. Eat a healthy diet and stay active. You can always lose the weight gained after you quit. ? Find ways to improve your mood other than smoking.  FOR MORE INFORMATION  www.smokefree.gov   While it can be one of the most difficult things to do, the [place] community has programs to help you stop. Consider talking with your primary care physician about options. Also, Smoking Cessation classes are available through the [facility]:  The smoking cessation program  is a proven-effective program from the [employer]. The program is available for anyone 28 and older who currently smokes. The program lasts for 7 weeks and is 8 sessions. Each class will be approximately 1 1/2 hours. The program is every Tuesday. All classes are 12-1:30pm and same location.  Event Location Information:  Location: [place] 2nd Floor  Conference Room 2-037; located next to [facility] cross streets: [place] Entrance into the [facility] is adjacent to the [employer] main entrance. The conference room is located on the 2nd floor.  Parking Instructions: Visitor parking is adjacent to [employer] main entrance and the [place]  A smoking cessation program is also offered through the [facility]. Register online at [email or web address] or call [phone] for more information.  Tobacco cessation counseling is available at [facility]. Call [phone] for a free appointment.  Tobacco cessation classes also are available through the [facility] in [place]. For information, call [phone].  The Patient Education Network features videos on tobacco cessation. Please consult your listings in the center of this book to find instructions on how to access this resource.   ################################################################################################  #########################################################################################################################################################  SURGERY: POST OPERATIVE INSTRUCTIONS (Surgery for small bowel obstruction, colon resection, etc)  #########################################################################################################################################################  EAT Gradually transition to a high fiber diet with a fiber supplement over the next few days after discharge  WALK Walk an hour a day. Control your pain to do that.   CONTROL PAIN Control pain so that you can walk, sleep, tolerate sneezing/coughing, go up/down stairs.  HAVE A BOWEL MOVEMENT DAILY Keep your bowels regular to avoid problems. OK to try a laxative to override constipation. OK to use an antidairrheal to slow  down diarrhea. Call if not better after 2 tries  CALL IF YOU HAVE PROBLEMS/CONCERNS Call if you are still struggling despite following these instructions. Call if you have concerns not answered by these instructions  #########################################################################################################################################################  DIET Follow a light diet the first few days at home. Start with a bland diet such as soups, liquids, starchy foods, low fat foods, etc. If you feel full, bloated, or constipated, stay on a ful liquid or pureed/blenderized diet for a few days until you feel better and no longer constipated. Be sure to drink plenty of fluids every day to avoid getting dehydrated (feeling dizzy, not urinating, etc.). Gradually add a fiber supplement to your diet over the next week. Gradually get back to a regular solid diet. Avoid fast food or heavy meals the first week as you are more likely to get nauseated. It is expected for your digestive tract to need a few months to get back to normal. It is common for your bowel movements and stools to be irregular. You will have occasional bloating and cramping that should eventually fade away. Until you are eating solid food normally, off all pain medications, and back to regular activities; your bowels will not be normal. Focus on eating a low-fat, high fiber diet the rest of your life (See Getting to [place], below).  CARE of your INCISION or WOUND It is good for closed incision and even open wounds to be washed every day. Shower every day. Short baths are fine. Wash the incisions and wounds clean with soap & water.   If you have a closed incision(s), wash the incision with soap & water every day. You  may leave closed incisions open to air if it is dry. You may cover the incision with clean gauze & replace it after your daily shower for comfort.  It is good for closed incisions and even open  wounds to be washed every day. Shower every day. Short baths are fine. Wash the incisions and wounds clean with soap & water.  You may leave closed incisions open to air if it is dry. You may cover the incision with clean gauze & replace it after your daily shower for comfort.  TEGADERM: You have clear gauze band-aid dressings over your closed incision(s). Remove the dressings 3 days after surgery.  If you have an open wound with a wound vac, see wound vac care instructions.  ACTIVITIES as tolerated Start light daily activities --- self-care, walking, climbing stairs-- beginning the day after surgery. Gradually increase activities as tolerated. Control your pain to be active. Stop when you are tired. Ideally, walk several times a day, eventually an hour a day.  Most people are back to most day-to-day activities in a few weeks. It takes 4-8 weeks to get back to unrestricted, intense activity. If you can walk 30 minutes without difficulty, it is safe to try more intense activity such as jogging, treadmill, bicycling, low-impact aerobics, swimming, etc. Save the most intensive and strenuous activity for last (Usually 4-8 weeks after surgery) such as sit-ups, heavy lifting, contact sports, etc. Refrain from any intense heavy lifting or straining until you are off narcotics for pain control. You will have off days, but things should improve week-by-week.  DO NOT PUSH THROUGH PAIN. Let pain be your guide: If it hurts to do something, don't do it. Pain is your body warning you to avoid that activity for another week until the pain goes down. You may drive when you are no longer taking narcotic prescription pain medication, you can comfortably wear a seatbelt, and you can safely make sudden turns/stops to protect yourself without hesitating due to pain. You may have sexual intercourse when it is comfortable. If it hurts to do something, stop.  MEDICATIONS Take your usually prescribed home medications  unless otherwise directed.  Blood thinners:  Usually you can restart any strong blood thinners after the second postoperative day. It is OK to take aspirin right away.  If you are on strong blood thinners (warfarin/Coumadin, Plavix, Xerelto, Eliquis, Pradaxa, etc), discuss with your surgeon, medicine PCP, and/or cardiologist for instructions on when to restart the blood thinner & if blood monitoring is needed (PT/INR blood check, etc).   PAIN CONTROL Pain after surgery or related to activity is often due to strain/injury to muscle, tendon, nerves and/or incisions. This pain is usually short-term and will improve in a few months.  To help speed the process of healing and to get back to regular activity more quickly, DO THE FOLLOWING THINGS TOGETHER: 1. Increase activity gradually. DO NOT PUSH THROUGH PAIN 2. Use Ice and/or Heat 3. Try Gentle Massage and/or Stretching 4. Take over the counter pain medication 5. Take Narcotic prescription pain medication for more severe pain  Good pain control = faster recovery. It is better to take more medicine to be more active than to stay in bed all day to avoid medications. 1. Increase activity gradually Avoid heavy lifting at first, then increase to lifting as tolerated over the next 6 weeks. Do not "push through" the pain. Listen to your body and avoid positions and maneuvers than reproduce the pain. Wait a few days before  trying something more intense Walking an hour a day is encouraged to help your body recover faster and more safely. Start slowly and stop when getting sore. If you can walk 30 minutes without stopping or pain, you can try more intense activity (running, jogging, aerobics, cycling, swimming, treadmill, sex, sports, weightlifting, etc.) Remember: If it hurts to do it, then don't do it! 2. Use Ice and/or Heat You will have swelling and bruising around the incisions. This will take several weeks to resolve. Ice packs or heating pads (6-8  times a day, 30-60 minutes at a time) will help sooth soreness & bruising. Some people prefer to use ice alone, heat alone, or alternate between ice & heat. Experiment and see what works best for you. Consider trying ice for the first few days to help decrease swelling and bruising; then, switch to heat to help relax sore spots and speed recovery. Shower every day. Short baths are fine. It feels good! Keep the incisions and wounds clean with soap & water.  3. Try Gentle Massage and/or Stretching Massage at the area of pain many times a day Stop if you feel pain - do not overdo it 4. Take over the counter pain medication This helps the muscle and nerve tissues become less irritable and calm down faster Choose ONE of the following over-the-counter anti-inflammatory medications: Acetaminophen 500mg  tabs (Tylenol) 1-2 pills with every meal and just before bedtime (avoid if you have liver problems or if you have acetaminophen in you narcotic prescription) Naproxen 220mg  tabs (ex. Aleve, Naprosyn) 1-2 pills twice a day (avoid if you have kidney, stomach, IBD, or bleeding problems) Ibuprofen 200mg  tabs (ex. Advil, Motrin) 3-4 pills with every meal and just before bedtime (avoid if you have kidney, stomach, IBD, or bleeding problems) Take with food/snack several times a day as directed for at least 2 weeks to help keep pain / soreness down & more manageable. 5. Take Narcotic prescription pain medication for more severe pain A prescription for strong pain control is often given to you upon discharge (for example: oxycodone/Percocet, hydrocodone/Norco/Vicodin, or tramadol/Ultram) Take your pain medication as prescribed. Be mindful that most narcotic prescriptions contain Tylenol (acetaminophen) as well - avoid taking too much Tylenol. If you are having problems/concerns with the prescription medicine (does not control pain, nausea, vomiting, rash, itching, etc.), please call us [phone] to see if we  need to switch you to a different pain medicine that will work better for you and/or control your side effects better. If you need a refill on your pain medication, you must call the office before 4 pm and on weekdays only. By federal law, prescriptions for narcotics cannot be called into a pharmacy. They must be filled out on paper & picked up from our office by the patient or authorized caretaker. Prescriptions cannot be filled after 4 pm nor on weekends.   WHEN TO CALL us [phone] Severe uncontrolled or worsening pain  Fever over 101 F (38.5 C) Concerns with the incision: Worsening pain, redness, rash/hives, swelling, bleeding, or drainage Reactions / problems with new medications (itching, rash, hives, nausea, etc.) Nausea and/or vomiting Difficulty urinating Difficulty breathing Worsening fatigue, dizziness, lightheadedness, blurred vision Other concerns  If you are not getting better after two weeks or are noticing you are getting worse, contact our office [phone] for further advice. We may need to adjust your medications, re-evaluate you in the office, send you to the emergency room, or see what other things we can  do to help. The clinic staff is available to answer your questions during regular business hours (8:30am-5pm). Please don't hesitate to call and ask to speak to one of our nurses for clinical concerns.   A surgeon from [facility] is always on call at the hospitals 24 hours/day If you have a medical emergency, go to the nearest emergency room or call 911.  FOLLOW UP in our office One the day of your discharge from the hospital (or the next business weekday), please call [facility] to set up or confirm an appointment to see your surgeon in the office for a follow-up appointment. Usually it is 2-3 weeks after your surgery.  If you have skin staples at your incision(s), let the office know so we can set up a time in the office for the nurse  to remove them (usually around 10 days after surgery). Make sure that you call for appointments the day of discharge (or the next business weekday) from the hospital to ensure a convenient appointment time. IF YOU HAVE DISABILITY OR FAMILY LEAVE FORMS, BRING THEM TO THE OFFICE FOR PROCESSING. DO NOT GIVE THEM TO YOUR DOCTOR.  [facility] [address] [place], [place], [place] [address] ? [phone] - Main [phone] - [place], [phone] - Fax [email or web address]  GETTING TO GOOD BOWEL HEALTH.  It is expected for your digestive tract to need a few months to get back to normal. It is common for your bowel movements and stools to be irregular. You will have occasional bloating and cramping that should eventually fade away. Until you are eating solid food normally, off all pain medications, and back to regular activities; your bowels will not be normal.  Avoiding constipation The goal: ONE SOFT BOWEL MOVEMENT A DAY!   Drink plenty of fluids. Choose water first.  TAKE A FIBER SUPPLEMENT EVERY DAY THE REST OF YOUR LIFE  During your first week back home, gradually add back a fiber supplement every day  Experiment which form you can tolerate. There are many forms such as powders, tablets, wafers, gummies, etc. Psyllium bran (Metamucil), methylcellulose (Citrucel), Miralax or Glycolax, Benefiber, Flax Seed.   Adjust the dose week-by-week (1/2 dose/day to 6 doses a day) until you are moving your bowels 1-2 times a day. Cut back the dose or try a different fiber product if it is giving you problems such as diarrhea or bloating.  Sometimes a laxative is needed to help jump-start bowels if constipated until the fiber supplement can help regulate your bowels. If you are tolerating eating & you are farting, it is okay to try a gentle laxative such as double dose MiraLax, prune juice, or Milk of Magnesia. Avoid using laxatives too often.  Stool  softeners can sometimes help counteract the constipating effects of narcotic pain medicines. It can also cause diarrhea, so avoid using for too long. If you are still constipated despite taking fiber daily, eating solids, and a few doses of laxatives, call our office.  Controlling diarrhea Try drinking liquids and eating bland foods for a few days to avoid stressing your intestines further. Avoid dairy products (especially milk & ice cream) for a short time. The intestines often can lose the ability to digest lactose when stressed. Avoid foods that cause gassiness or bloating. Typical foods include beans and other legumes, cabbage, broccoli, and dairy foods. Avoid greasy, spicy, fast foods. Every person has some sensitivity to other foods, so listen to your body and  avoid those foods that trigger problems for you. Probiotics (such as active yogurt, Align, etc) may help repopulate the intestines and colon with normal bacteria and calm down a sensitive digestive tract Adding a fiber supplement gradually can help thicken stools by absorbing excess fluid and retrain the intestines to act more normally. Slowly increase the dose over a few weeks. Too much fiber too soon can backfire and cause cramping & bloating. It is okay to try and slow down diarrhea with a few doses of antidiarrheal medicines.  Bismuth subsalicylate (ex. Kayopectate, Pepto Bismol) for a few doses can help control diarrhea. Avoid if pregnant.  Loperamide (Imodium) can slow down diarrhea. Start with one tablet (2mg ) first. Avoid if you are having fevers or severe pain.   ILEOSTOMY PATIENTS WILL HAVE CHRONIC DIARRHEA since their colon is not in use.  Drink plenty of liquids. You will need to drink even more glasses of water/liquid a day to avoid getting dehydrated.  Record output from your ileostomy. Expect to empty the bag every 3-4 hours at first. Most people with a permanent ileostomy empty their bag 4-6 times at the least.  Use  antidiarrheal medicine (especially Imodium) several times a day to avoid getting dehydrated. Start with a dose at bedtime & breakfast. Adjust up or down as needed. Increase antidiarrheal medications as directed to avoid emptying the bag more than 8 times a day (every 3 hours).  Work with your wound ostomy nurse to learn care for your ostomy. See ostomy care instructions.  TROUBLESHOOTING IRREGULAR BOWELS 1) Start with a soft & bland diet. No spicy, greasy, or fried foods.  2) Avoid gluten/wheat or dairy products from diet to see if symptoms improve. 3) Miralax 17gm or flax seed mixed in [place]. water or juice-daily. May use 2-4 times a day as needed. 4) Gas-X, Phazyme, etc. as needed for gas & bloating.  5) Prilosec (omeprazole) over-the-counter as needed 6) Consider probiotics (Align, Activa, etc) to help calm the bowels down  Call your doctor if you are getting worse or not getting better. Sometimes further testing (cultures, endoscopy, X-ray studies, CT scans, bloodwork, etc.) may be needed to help diagnose and treat the cause of the diarrhea. [facility], [place], [place], [place], [place] [address] [phone] - Main.  [phone] - Toll Free. [phone] - Fax [email or web address]  #########################################################################################################################################################     Electronically signed by Hollace Kinnier, MD at 05/09/2021 3:56 PM EST   Progress Notes - documented in this encounter Gross, Adrian Saran, MD - 05/09/2021 3:15 PM EST Formatting of this note is different from the original. Images from the original note were not included.   REFERRING PHYSICIAN: Plotnikov, Evie Lacks, MD  Patient Care Team: Cassandria Anger, MD as PCP - General (Internal Medicine) Milus Banister, MD (Gastroenterology)  PROVIDER: Hollace Kinnier, MD  [facility] MRN:  [id number] DOB: November 14, 1960 DATE OF ENCOUNTER: 05/09/2021  SUBJECTIVE   Chief Complaint: Diverticulitis   History of Present Illness: Tamara Charles is a 60 y.o. female who is seen today  as an office consultation at the request of Dr. Alain Marion  for evaluation of Diverticulitis .   Former smoker. History of spontaneous blebs in pneumothoraxes. No major issues in the past decade. Has had episodes of left lower quadrant abdominal pain with at least 1 CAT scan confirming diverticulitis. Usually managed with oral antibiotic regimens. However she has issues with most antibiotics now. Saw gastroenterology. Dr. Ardis Hughs in 2020. Had discussion about  diet adjustment and recommended screening colonoscopy. As far as I can tell that did not happen. She recalls having an underwhelming colonoscopy when she lived in [place] about 15 years ago. Records not available.  She had another episode of diverticulitis this year. Discussed with her primary care physician. He recommended considering surgical consultation. Patient is a history of UTIs as well. She is never needed to be hospitalized from. However she has had a lot of issues with oral antibiotics. Augmentin seems to work but gives her diarrhea. Had a rash on cefdinir/Flagyl. Cannot tolerate Bactrim nor Cipro.  Patient here with her niece. She recalls having attacks for many years. I see CAT scans from 2019, 2021, 2022 for diverticulitis. She smokes less than a pack a day. Has quit for up to a 3 years but is back on it. She walks an hour with her niece many times a week without issues. No exertional chest pain or shortness of breath. 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 No issues since. No sleep apnea. No diabetes. She had a hysterectomy in the distant past but nothing recently. She is just hoping to getting back to eating pecan pie. She is try to avoid nuts and seeds even though she was reassured by her gastrologist it  was safe to consume. Does not recall any specific food triggering her episodes/attacks.  Medical History:  Past Medical History:  Diagnosis Date   Hypertension   Patient Active Problem List  Diagnosis   Diverticulitis of sigmoid colon   Tobacco abuse   Past Surgical History:  Procedure Laterality Date   HYSTERECTOMY   lung surgery N/A    Allergies  Allergen Reactions   Amoxicillin-Pot Clavulanate Other (See Comments)  Some diarrhea. No rash. Able to tolerate   Ciprofloxacin Unknown  Sun rash   Sulfamethoxazole-Trimethoprim Nausea   Cefdinir Rash  Was taking with flagyl, developed rash   Metronidazole Rash  Nausea - needs anti-nausea Rx w/it  Took with cefdinir and developed rash   Current Outpatient Medications on File Prior to Visit  Medication Sig Dispense Refill   cholecalciferol (VITAMIN D3) 2,000 unit capsule Take by mouth   nortriptyline (PAMELOR) 10 MG capsule Take 1 capsule (10 mg total) by mouth at bedtime   ondansetron (ZOFRAN-ODT) 4 MG disintegrating tablet Take 1 tablet (4 mg total) by mouth every 8 (eight) hours as needed   valACYclovir (VALTREX) 500 MG tablet Take 1 tablet (500 mg total) by mouth at bedtime   calcium carbonate (TUMS) 200 mg calcium (500 mg) chewable tablet Take by mouth   cefdinir (OMNICEF) 300 mg capsule Take 1 capsule (300 mg total) by mouth 2 (two) times daily   meloxicam (MOBIC) 15 MG tablet   metroNIDAZOLE (FLAGYL) 500 MG tablet Take 1 tablet (500 mg total) by mouth 2 (two) times daily   naproxen sodium (ALEVE) 220 MG tablet Take by mouth   psyllium, aspartame, (METAMUCIL) 3.4 gram packet Take by mouth   trimethoprim 100 mg tablet   No current facility-administered medications on file prior to visit.   History reviewed. No pertinent family history.   Social History   Tobacco Use  Smoking Status Never  Smokeless Tobacco Never    Social History   Socioeconomic History   Marital status: Married  Tobacco Use   Smoking  status: Never   Smokeless tobacco: Never  Vaping Use   Vaping Use: Never used  Substance and Sexual Activity   Alcohol use:  Yes   Drug use: Never   ############################################################  Review of Systems: A complete review of systems (ROS) was obtained from the patient. I have reviewed this information and discussed as appropriate with the patient. See HPI as well for other pertinent ROS.  Constitutional: No fevers, chills, sweats. Weight stable Eyes: No vision changes, No discharge HENT: No sore throats, nasal drainage Lymph: No neck swelling, No bruising easily Pulmonary: No cough, productive sputum CV: No orthopnea, PND Patient walks 60 minutes for about 3 miles without difficulty. No exertional chest/neck/shoulder/arm pain.  GI: No personal nor family history of GI/colon cancer, inflammatory bowel disease, irritable bowel syndrome, allergy such as Celiac Sprue, dietary/dairy problems, colitis, ulcers nor gastritis. No recent sick contacts/gastroenteritis. No travel outside the country. No changes in diet.  Renal: No UTIs, No hematuria Genital: No drainage, bleeding, masses Musculoskeletal: No severe joint pain. Good ROM major joints Skin: No sores or lesions Heme/Lymph: No easy bleeding. No swollen lymph nodes  OBJECTIVE   Vitals:  05/09/21 1526  BP: (!) 138/90  Pulse: 84  Temp: 36.9 C (98.4 F)  SpO2: 96%  Weight: 75.9 kg (167 lb 6.4 oz)  Height: 170.2 cm (5\' 7" )    Body mass index is 26.22 kg/m.  PHYSICAL EXAM:  Constitutional: Not cachectic. Hygeine adequate. Vitals signs as above.  Eyes: Pupils reactive, normal extraocular movements. Sclera nonicteric Neuro: CN II-XII intact. No major focal sensory defects. No major motor deficits. Lymph: No head/neck/groin lymphadenopathy Psych: No severe agitation. No severe anxiety. Judgment & insight Adequate, Oriented x4, HENT: Normocephalic, Mucus membranes moist. No thrush.  Neck: Supple, No  tracheal deviation. No obvious thyromegaly Chest: No pain to chest wall compression. Good respiratory excursion. No audible wheezing CV: Pulses intact. Regular rhythm. No major extremity edema Ext: No obvious deformity or contracture. Edema: Not present. No cyanosis Skin: No major subcutaneous nodules. Warm and dry Musculoskeletal: Severe joint rigidity not present. No obvious clubbing. No digital petechiae.   Abdomen:  Flat Hernia: Not present. Diastasis recti: Not present. Soft. Nondistended. Nontender. Some mild soreness along left lower quadrant/flank. No guarding. No peritonitis no hepatomegaly. No splenomegaly  Genital/Pelvic: Inguinal hernia: Not present. Inguinal lymph nodes: without lymphadenopathy.   Rectal: (Deferred)    ###################################################################  Labs, Imaging and Diagnostic Testing:  Located in 'Care Everywhere' section of Epic EMR chart  PRIOR [facility] CLIN[facility] NOTES:  Not applicable  SURGERY NOTES:  Not applicable  PATHOLOGY:  Not applicable  Assessment and Plan:  DIAGNOSES:  Diagnoses and all orders for this visit:  Diverticulitis of sigmoid colon  Tobacco abuse  Recurrent UTI (urinary tract infection)    ASSESSMENT/PLAN  Pleasant smoking female with recurrent episodes of left lower quadrant pain and CT scan showing episodes of diverticulitis. One seem to be at the descending/sigmoid junction. Another one to my view seems to be more in the mid sigmoid. No evidence of any fistula to bladder or vagina even though recurrent UTIs.  Think she has had repeated enough attacks to consider elective segmental resection. She is interested in the idea since she is only in her 54s and she is having attacks every year. 2 this year.  The anatomy & physiology of the digestive tract was discussed. The pathophysiology of the colon was discussed. Natural history risks without surgery was discussed. I feel the risks of no  intervention will lead to serious problems that outweigh the operative risks; therefore, I recommended a partial colectomy to remove the pathology. Minimally invasive (Robotic/Laparoscopic) & open techniques  were discussed.   Risks such as bleeding, infection, abscess, leak, reoperation, injury to other organs, need for repair of tissues / organs, possible ostomy, hernia, heart attack, stroke, death, and other risks were discussed. I noted a good likelihood this will help address the problem. Goals of post-operative recovery were discussed as well. Need for adequate nutrition, daily bowel regimen and healthy physical activity, to optimize recovery was noted as well. We will work to minimize complications. Educational materials were available as well. Questions were answered. The patient expresses understanding & wishes to proceed with surgery.  She is overdue for colonoscopy. Had a been recommended in 2020. See if we can coordinate this to get it done the day before surgery, that way the patient just gets 1 bowel prep. We will try and get this done in February when she returns from her travels. She is not having any severe symptoms right now so probably can wait an extra month  I strong recommend she quit smoking. STOP SMOKING! We talked to the patient about the dangers of smoking. We stressed that tobacco use dramatically increases the risk of peri-operative complications such as infection, tissue necrosis leaving to problems with incision/wound and organ healing, hernia, chronic pain, heart attack, stroke, DVT, pulmonary embolism, and death. We noted there are programs in our community to help stop smoking. Information was available.  FOLLOWUP: Return for Plan to schedule surgery. See instructions.  The plan was discussed in detail with the patient today, who expressed understanding & appreciation. The patient has my contact information, and understands to call us with any additional questions or  concerns in the interval. I would be happy to see the patient back sooner if the need arises.  Portions of this report may have been transcribed using voice recognition software. Every effort was made to ensure accuracy; however, inadvertent computerized transcription errors may be present. Any transcriptional errors that result from this process are unintentional.  ########################################################  Adin Hector, MD, FACS, MASCRS Esophageal, Gastrointestinal & Colorectal Surgery Robotic and Minimally Invasive Surgery  [facility], [place]  [place]. [address], [place], [place] [address] [phone] Fax [phone] Main      05/09/2021   Electronically signed by Hollace Kinnier, MD at 05/09/2021 4:08 PM EST  Plan of Treatment - documented as of this encounter Plan of Treatment - Scheduled Referrals Scheduled Referrals Name Type Priority Associated Diagnoses Order Schedule  Ambulatory Referral to Gastroenterology Outpatient Referral ASAP Diverticulitis of sigmoid colon   Tobacco abuse   Recurrent UTI (urinary tract infection)   Ordered: 05/12/2021   Visit Diagnoses - documented in this encounter Visit Diagnoses Diagnosis  Diverticulitis of sigmoid colon - Primary   Diverticulitis of colon (without mention of hemorrhage)    Tobacco abuse   Tobacco use disorder    Recurrent UTI (urinary tract infection)     Care Teams - documented as of this encounter Care Teams Team Member Relationship Specialty Start Date End Date  Plotnikov, Evie Lacks, MD   [place], [place] [address]   [phone] (Work)   [phone] (Fax)   PCP - General Internal Medicine 04/13/21    Milus Banister, MD   [address]   [place], [place] [address]   [phone] (Work)   [phone] (Fax)     Gastroenterology 05/09/21     Images Patient  Demographics  Patient Demographics Patient Address Communication  Language Race / Ethnicity Marital Status  [place] (Home) [place], [place] [address]  Former (Oct 21, 2010 - Nov. 08, 2022): [address] [place]) [place], [place] [address] [phone] [facility]) [phone] (Home) [email or web address] English (Preferred) White / Not Hispanic or Latino Married   Patient Contacts  Patient Contacts Contact Name Contact Address Communication Relationship to Patient  Tonetta Napoles [address] [place], [place] [address] [phone] [facility]) [phone] (Work) [phone] (Home) Spouse, Emergency Contact   Document Information  Primary Care Provider Other Service Providers Document Coverage Dates  Cassandria Anger, MD (Nov. 09, 2022November 09, 2022 - Present) [phone] (Work) [phone] (Fax) [place], [place] [address] Internal Medicine [facility] [place], [place] [address] Milus Banister, MD [phone] (Work) [phone] (Fax) [address] [place], [place] [address] Gastroenterology [facility] [place], [place] [address] Dec. 05, 2022December 05, 2022   [place] [address] [place], [place] [address]   Encounter Providers Encounter Date  Hollace Kinnier, MD (Attending) [phone] (Work) [phone] (Fax) [address] [place], [place] [address] General Surgery Dec. 05, 2022December 05, 2022   Legal Authenticator   Associate Chief Health Information Officer     Show All Sections

## 2021-05-12 NOTE — Telephone Encounter (Signed)
See alternate note  

## 2021-05-13 ENCOUNTER — Other Ambulatory Visit: Payer: Self-pay

## 2021-05-13 ENCOUNTER — Encounter: Payer: Self-pay | Admitting: Emergency Medicine

## 2021-05-13 ENCOUNTER — Ambulatory Visit
Admission: EM | Admit: 2021-05-13 | Discharge: 2021-05-13 | Disposition: A | Discharge status: Home / Self Care | Payer: 59 | Attending: Emergency Medicine | Admitting: Emergency Medicine

## 2021-05-13 DIAGNOSIS — R300 Dysuria: Secondary | ICD-10-CM | POA: Insufficient documentation

## 2021-05-13 DIAGNOSIS — N390 Urinary tract infection, site not specified: Secondary | ICD-10-CM | POA: Diagnosis present

## 2021-05-13 LAB — POCT URINALYSIS DIP (MANUAL ENTRY)
Bilirubin, UA: NEGATIVE
Blood, UA: NEGATIVE
Glucose, UA: NEGATIVE mg/dL
Ketones, POC UA: NEGATIVE mg/dL
Leukocytes, UA: NEGATIVE
Nitrite, UA: NEGATIVE
Protein Ur, POC: NEGATIVE mg/dL
Spec Grav, UA: 1.01 (ref 1.010–1.025)
Urobilinogen, UA: 0.2 E.U./dL
pH, UA: 7 (ref 5.0–8.0)

## 2021-05-13 MED ORDER — FLUCONAZOLE 150 MG PO TABS: ORAL_TABLET | ORAL | 2 refills | Status: DC

## 2021-05-13 MED ORDER — ONDANSETRON 4 MG PO TBDP: 4.0000 mg | ORAL_TABLET | Freq: Three times a day (TID) | ORAL | 0 refills | Status: DC | PRN

## 2021-05-13 MED ORDER — SULFAMETHOXAZOLE-TRIMETHOPRIM 800-160 MG PO TABS: 1.0000 | ORAL_TABLET | Freq: Two times a day (BID) | ORAL | 0 refills | Status: DC

## 2021-05-13 NOTE — ED Provider Notes (Signed)
[facility]    CSN: [id number] Arrival date & time: 05/13/21  5284    HISTORY   Chief Complaint  Patient presents with   Dysuria   HPI Tamara Charles is a 60 y.o. female. Patient presents to [facility] for evaluation of left flank pain starting 4 days ago.  Patient states she has a hx of recurrent UTIs and was taking a daily cephalexin starting in July, and then she decided not to take it a few weeks ago anymore because she was worried about c-diff.  She states she never has symptoms until her back starts hurting.  Patient states she is at increased frequency of urination and incomplete emptying.  Denies burning with urination, vaginal discharge, pelvic pain, pelvic pressure, abnormal vaginal bleeding, genital lesions.  Patient states she took tylenol for the pain last night with mild improvement.  Patient states she has had 5 urinary tract infections this year.  Patient states she sees his urologist for history family history of bladder cancer, sees a specialist for a history of diverticulitis.  The history is provided by the patient.  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   Left flank pain 06/26/2018   Acute bronchitis 06/26/2018   Lower abdominal pain 06/26/2018   Diverticulitis 01/28/2018   Diarrhea 01/28/2018   Rash and nonspecific skin eruption 10/23/2017   Hot flash not due to menopause 06/26/2016   Herpes simplex 04/20/2015   Weight gain 08/25/2014   Well  adult exam 02/23/2014   Bleb, lung ([place]) 02/23/2014   Family history of early CAD 02/23/2014   Tobacco use disorder 02/23/2014   Hypertension 04/21/2013   Spontaneous pneumothorax 04/21/2013   Past Surgical History:  Procedure Laterality Date   ABDOMINAL HYSTERECTOMY     partial   BREAST SURGERY Bilateral    breast reduction   LUNG SURGERY Bilateral 1997   REDUCTION MAMMAPLASTY     OB History   No obstetric history on file.    Home Medications    Prior to Admission medications   Medication Sig Start Date End Date Taking? Authorizing Provider  fluconazole (DIFLUCAN) 150 MG tablet Take 1 tablet today.  Take second tablet 3 days later. 05/13/21  Yes Lynden Oxford Scales, PA-C  sulfamethoxazole-trimethoprim (BACTRIM DS) 800-160 MG tablet Take 1 tablet by mouth 2 (two) times daily for 5 days. 05/13/21 05/18/21 Yes Lynden Oxford Scales, PA-C  aspirin EC 81 MG tablet Take 1 tablet (81 mg total) by mouth daily. 02/23/14   Plotnikov, Evie Lacks, MD  b complex vitamins tablet Take 1 tablet by mouth daily. 07/09/19   Plotnikov, Evie Lacks, MD  calcium carbonate (TUMS - DOSED IN MG ELEMENTAL CALCIUM) 500 MG chewable tablet Chew 1 tablet by mouth 3 (three) times daily.    [provider]  Cholecalciferol (VITAMIN D3) 50 MCG (2000 UT) capsule Take 1 capsule (2,000 Units total) by mouth daily. 07/09/19   Plotnikov, Evie Lacks, MD  meloxicam (MOBIC) 15 MG  tablet TAKE 1 TABLET BY MOUTH EVERY DAY 04/12/21   Plotnikov, Evie Lacks, MD  naproxen sodium (ANAPROX) 220 MG tablet Take 220 mg by mouth 2 (two) times daily with a meal.    [provider]  ondansetron (ZOFRAN ODT) 4 MG disintegrating tablet Take 1 tablet (4 mg total) by mouth every 8 (eight) hours as needed for nausea or vomiting. 05/13/21   Lynden Oxford Scales, PA-C  polyethylene glycol-electrolytes (NULYTELY/GOLYTELY) 420 g solution Take 4,000 mLs by mouth as directed. 02/26/19   Milus Banister, MD  Probiotic Product (ALIGN) 4 MG  CAPS Take 1 capsule (4 mg total) by mouth daily. Patient taking differently: Take 1 capsule by mouth at bedtime. 01/02/19   Plotnikov, Evie Lacks, MD  psyllium (METAMUCIL) 58.6 % packet Take 1 packet by mouth every other day.    [provider]  triamterene-hydrochlorothiazide (MAXZIDE-25) 37.5-25 MG tablet Take 1 tablet by mouth daily. Take 1 by mouth daily 03/05/21   [provider]  trimethoprim (TRIMPEX) 100 MG tablet Take 100 mg by mouth daily. 03/15/21   [provider]  valACYclovir (VALTREX) 500 MG tablet TAKE 1 TABLET BY MOUTH AT BEDTIME. 04/28/21   Plotnikov, Evie Lacks, MD   Family History Family History  Problem Relation Age of Onset   Heart disease Mother    Hypertension Mother    Heart disease Brother    Breast cancer Maternal Aunt    Bladder Cancer Maternal Grandmother    Social History Social History   Tobacco Use   Smoking status: Every Day    Packs/day: 0.50    Types: Cigarettes   Smokeless tobacco: Never  Vaping Use   Vaping Use: Never used  Substance Use Topics   Alcohol use: Yes    Comment: occ   Drug use: Yes    Frequency: 7.0 times per week    Types: Marijuana    Comment: daily    Allergies   Augmentin [amoxicillin-pot clavulanate], Bactrim [sulfamethoxazole-trimethoprim], Ciprofloxacin, Cefdinir, and Flagyl [metronidazole]  Review of Systems Review of Systems Pertinent findings noted in history of present illness.   Physical Exam Triage Vital Signs ED Triage Vitals  Enc Vitals Group     BP 04/01/21 0827 (!) 147/82     Pulse Rate 04/01/21 0827 72     Resp 04/01/21 0827 18     Temp 04/01/21 0827 98.3 F (36.8 C)     Temp Source 04/01/21 0827 Oral     SpO2 04/01/21 0827 98 %     Weight --      Height --      Head Circumference --      Peak Flow --      Pain Score 04/01/21 0826 5     Pain Loc --      Pain Edu? --      Excl. in [place]? --   No data found.  Updated Vital Signs BP (!) 140/92 (BP Location: Right Arm)    Pulse 70   Temp 99 F (37.2 C) (Oral)   Resp 18   LMP 06/05/2008   SpO2 96%   Physical Exam Vitals and nursing note reviewed.  Constitutional:      General: She is not in acute distress.    Appearance: Normal appearance. She is not ill-appearing.  HENT:     Head: Normocephalic and atraumatic.  Eyes:     General: Lids are normal.        Right eye: No discharge.  Left eye: No discharge.     Extraocular Movements: Extraocular movements intact.     Conjunctiva/sclera: Conjunctivae normal.     Right eye: Right conjunctiva is not injected.     Left eye: Left conjunctiva is not injected.  Neck:     Trachea: Trachea and phonation normal.  Cardiovascular:     Rate and Rhythm: Regular rhythm.     Pulses: Normal pulses.     Heart sounds: Normal heart sounds. No murmur heard.   No friction rub. No gallop.  Pulmonary:     Effort: Pulmonary effort is normal. No accessory muscle usage, prolonged expiration or respiratory distress.     Breath sounds: Normal breath sounds. No stridor, decreased air movement or transmitted upper airway sounds. No decreased breath sounds, wheezing, rhonchi or rales.  Chest:     Chest wall: No tenderness.  Abdominal:     General: Abdomen is flat. Bowel sounds are normal.     Palpations: Abdomen is soft.  Musculoskeletal:        General: Normal range of motion.     Cervical back: Normal range of motion and neck supple. Normal range of motion.  Lymphadenopathy:     Cervical: No cervical adenopathy.  Skin:    General: Skin is warm and dry.     Findings: No erythema or rash.  Neurological:     General: No focal deficit present.     Mental Status: She is alert and oriented to person, place, and time.  Psychiatric:        Mood and Affect: Mood normal.        Behavior: Behavior normal.    Visual Acuity Right Eye Distance:   Left Eye Distance:   Bilateral Distance:    Right Eye Near:   Left Eye Near:    Bilateral Near:     UC Couse /  Diagnostics / Procedures:    EKG  Radiology No results found.  Procedures Procedures (including critical care time)  UC Diagnoses / Final Clinical Impressions(s)   I have reviewed the triage vital signs and the nursing notes.  Pertinent labs & imaging results that were available during my care of the patient were reviewed by me and considered in my medical decision making (see chart for details).    Final diagnoses:  Dysuria  Acute urinary tract infection  Recurrent urinary tract infection   Urine dip today was unremarkable.  Urine culture will be performed per our protocol.  Patient will be contacted results and adjustments to treatment will be provided as indicated based on the results.  Based on patient's history of recurrence, discontinuation of prophylactic therapy and symptoms described today, I feel it is appropriate to begin treating patient empirically for presumed bacterial urinary tract infection.  Patient was advised of possibility that urine culture results may be negative if sample provided was obtained late in the day causing urine to be more diluted.  Patient was advised that if antibiotics were effective after the first 24 to 36 hours, despite negative urine culture result, it is recommended that they complete the full course as prescribed.  Return precautions advised.  Drug allergies reviewed, all questions addressed.  ED Prescriptions     Medication Sig Dispense Auth. Provider   ondansetron (ZOFRAN ODT) 4 MG disintegrating tablet Take 1 tablet (4 mg total) by mouth every 8 (eight) hours as needed for nausea or vomiting. 20 tablet Lynden Oxford Scales, PA-C   sulfamethoxazole-trimethoprim (BACTRIM DS) 800-160 MG tablet Take 1  tablet by mouth 2 (two) times daily for 5 days. 10 tablet Lynden Oxford Scales, PA-C   fluconazole (DIFLUCAN) 150 MG tablet Take 1 tablet today.  Take second tablet 3 days later. 2 tablet Lynden Oxford Scales, PA-C      PDMP not reviewed  this encounter.  Pending results:  Labs Reviewed  URINE CULTURE  POCT URINALYSIS DIP (MANUAL ENTRY)    Medications Ordered in UC: Medications - No data to display  Disposition Upon Discharge:  Condition: stable for discharge home Home: take medications as prescribed; routine discharge instructions as discussed; follow up as advised.  Patient presented with an acute illness with associated systemic symptoms and significant discomfort requiring urgent management. In my opinion, this is a condition that a prudent lay person (someone who possesses an average knowledge of health and medicine) may potentially expect to result in complications if not addressed urgently such as respiratory distress, impairment of bodily function or dysfunction of bodily organs.   Routine symptom specific, illness specific and/or disease specific instructions were discussed with the patient and/or caregiver at length.   As such, the patient has been evaluated and assessed, work-up was performed and treatment was provided in alignment with urgent care protocols and evidence based medicine.  Patient/parent/caregiver has been advised that the patient may require follow up for further testing and treatment if the symptoms continue in spite of treatment, as clinically indicated and appropriate.  Patient/parent/caregiver has been advised to return to the [facility] or PCP in 3-5 days if no better; to PCP or the Emergency Department if new signs and symptoms develop, or if the current signs or symptoms continue to change or worsen for further workup, evaluation and treatment as clinically indicated and appropriate  The patient will follow up with their current PCP if and as advised. If the patient does not currently have a PCP we will assist them in obtaining one.   The patient may need specialty follow up if the symptoms continue, in spite of conservative treatment and management, for further workup, evaluation, consultation and  treatment as clinically indicated and appropriate.  Patient/parent/caregiver verbalized understanding and agreement of plan as discussed.  All questions were addressed during visit.  Please see discharge instructions below for further details of plan.  Discharge Instructions:   Discharge Instructions      Please begin Bactrim, 1 tablet twice daily for the next 5 days.  You are doing a great job staying well-hydrated, keep it up!  I printed out a picture of the Kegel toner machine that I was talking to you about along with some information about pelvic floor muscle exercises.  The gynecologist that I strongly encourage you to consider seeing is located in [place], but I promise you the [facility] on [address] where you can see her is only 20 minutes from this location.  I have renewed your prescription for Zofran, I have also provided you with a preventive dose of Diflucan in case you get a vaginal yeast infection from taking the Bactrim.  Thank you for visiting urgent care today, the results of your urine culture will be posted to your MyChart and if they are positive and any changes to your antibiotics need to be made, you will be contacted by phone.       Lynden Oxford Scales, PA-C 05/13/21 1418

## 2021-05-13 NOTE — Discharge Instructions (Signed)
Please begin Bactrim, 1 tablet twice daily for the next 5 days.  You are doing a great job staying well-hydrated, keep it up!  I printed out a picture of the Kegel toner machine that I was talking to you about along with some information about pelvic floor muscle exercises.  The gynecologist that I strongly encourage you to consider seeing is located in [place], but I promise you the [facility] on [address] where you can see her is only 20 minutes from this location.  I have renewed your prescription for Zofran, I have also provided you with a preventive dose of Diflucan in case you get a vaginal yeast infection from taking the Bactrim.  Thank you for visiting urgent care today, the results of your urine culture will be posted to your MyChart and if they are positive and any changes to your antibiotics need to be made, you will be contacted by phone.

## 2021-05-13 NOTE — ED Triage Notes (Signed)
Patient presents to [facility] for evaluation of left flank pain starting 4 days ago.  Patient states she has a hx of recurrent UTIs and was taking a daily cephalexin starting in July, and then she decided not to take it a few weeks ago anymore because she was worried about c-diff.  She states she never has symptoms until her back starts hurting.  Patient states she took tylenol for the pain last night with mild improvement.

## 2021-05-15 LAB — URINE CULTURE: Culture: 40000 — AB

## 2021-05-16 ENCOUNTER — Telehealth (HOSPITAL_COMMUNITY): Payer: Self-pay | Admitting: Emergency Medicine

## 2021-05-16 MED ORDER — NITROFURANTOIN MONOHYD MACRO 100 MG PO CAPS: 100.0000 mg | ORAL_CAPSULE | Freq: Two times a day (BID) | ORAL | 0 refills | Status: DC

## 2021-05-25 ENCOUNTER — Telehealth: Payer: Self-pay | Admitting: Internal Medicine

## 2021-05-25 DIAGNOSIS — N390 Urinary tract infection, site not specified: Secondary | ICD-10-CM

## 2021-05-25 DIAGNOSIS — R300 Dysuria: Secondary | ICD-10-CM

## 2021-05-25 NOTE — Telephone Encounter (Signed)
Patient states she went to the ED on 12-09 due to an UTI  Patient states she was prescribed an antibiotic that did not help  Patient states she is still experiencing the same UTI symptoms and is requesting a rx for another antibiotic  Pharmacy [id number] - [place], [place]  Please advise

## 2021-05-26 ENCOUNTER — Other Ambulatory Visit (INDEPENDENT_AMBULATORY_CARE_PROVIDER_SITE_OTHER): Payer: 59

## 2021-05-26 ENCOUNTER — Other Ambulatory Visit: Payer: Self-pay

## 2021-05-26 DIAGNOSIS — N390 Urinary tract infection, site not specified: Secondary | ICD-10-CM | POA: Diagnosis not present

## 2021-05-26 DIAGNOSIS — R300 Dysuria: Secondary | ICD-10-CM | POA: Diagnosis not present

## 2021-05-26 LAB — URINALYSIS, ROUTINE W REFLEX MICROSCOPIC
Bilirubin Urine: NEGATIVE
Hgb urine dipstick: NEGATIVE
Ketones, ur: NEGATIVE
Leukocytes,Ua: NEGATIVE
Nitrite: NEGATIVE
RBC / HPF: NONE SEEN (ref 0–?)
Specific Gravity, Urine: 1.015 (ref 1.000–1.030)
Total Protein, Urine: NEGATIVE
Urine Glucose: NEGATIVE
Urobilinogen, UA: 0.2 (ref 0.0–1.0)
pH: 7 (ref 5.0–8.0)

## 2021-05-26 NOTE — Telephone Encounter (Signed)
Tamara Charles a normal urine test on 12/9 and her culture did not grow any pathologic bacteria. I am not sure about urinary infection. She can take over-the-counter AZO tablets for a few days.  She can bring another UA/culture to the lab today before she starts Azo tablets. I should see her next week in the office. Thanks

## 2021-05-26 NOTE — Telephone Encounter (Signed)
Pt is headed in to do UA/Culture and also will start AZO.

## 2021-05-26 NOTE — Telephone Encounter (Signed)
Called pt there was no answer LMOM w/ MD response. Ordered UA/Cx...Tamara Charles

## 2021-05-27 ENCOUNTER — Telehealth: Payer: Self-pay

## 2021-05-27 LAB — URINE CULTURE: Result:: NO GROWTH

## 2021-05-27 NOTE — Telephone Encounter (Signed)
Initial urinalysis is negative  Urine culture is still pending, but there is no indication for change in tx at this time

## 2021-05-27 NOTE — Telephone Encounter (Signed)
Notified pt w/ MD response. Inform once we get cx back we will call with results.Marland KitchenJohny Charles

## 2021-05-27 NOTE — Telephone Encounter (Signed)
Pt called in wanting her UA/Culture result. Advise pt dr hadn't review them as of yet.  Please call pt with results.

## 2021-05-31 ENCOUNTER — Other Ambulatory Visit: Payer: Self-pay | Admitting: Internal Medicine

## 2021-06-15 ENCOUNTER — Encounter: Payer: Self-pay | Admitting: Gastroenterology

## 2021-06-15 ENCOUNTER — Ambulatory Visit: Payer: 59 | Admitting: Gastroenterology

## 2021-06-15 VITALS — BP 114/62 | HR 70 | Ht 66.0 in | Wt 165.0 lb

## 2021-06-15 DIAGNOSIS — K5792 Diverticulitis of intestine, part unspecified, without perforation or abscess without bleeding: Secondary | ICD-10-CM | POA: Diagnosis not present

## 2021-06-15 IMAGING — CT CT ABD-PELV W/O CM
2 of 4 series · 16 of 46 positions shown, 18 images · non-contrast
Comparison: April 15, 2020.

CLINICAL DATA: Acute left lower quadrant abdominal pain.

EXAM:
CT ABDOMEN AND PELVIS WITHOUT CONTRAST
TECHNIQUE: Multidetector CT imaging of the abdomen and pelvis was performed
following the standard protocol without IV contrast.

[Series 2: axial st · axial · 0.88mm/px · z∈[-474,-50]mm · 13 of 93 slices shown, 15 images]
[im 4/93  soft-tissue]
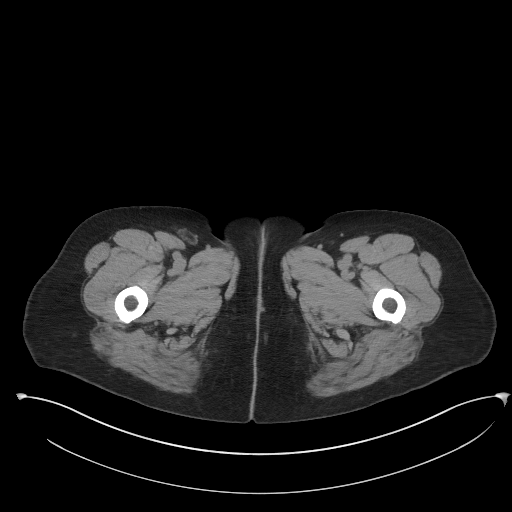
[im 4/93  bone]
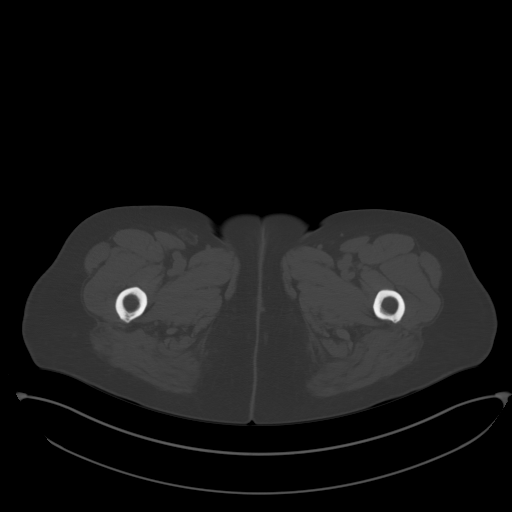
[im 12/93  soft-tissue]
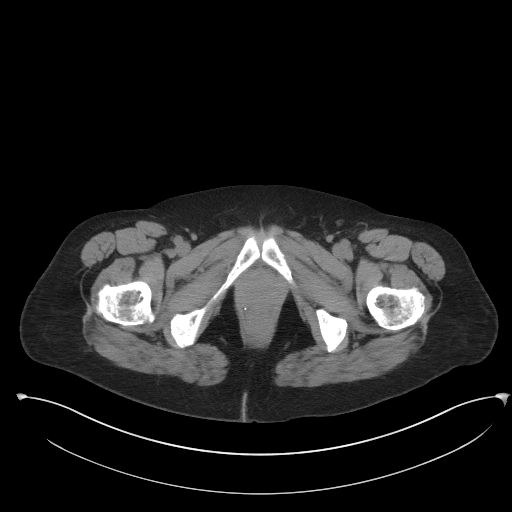
[im 20/93  soft-tissue]
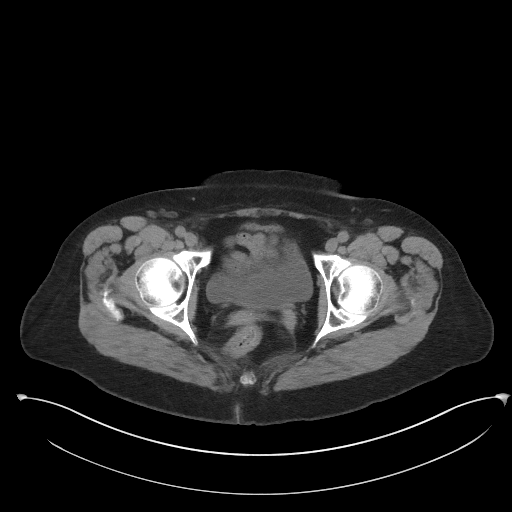
[im 27/93  soft-tissue]
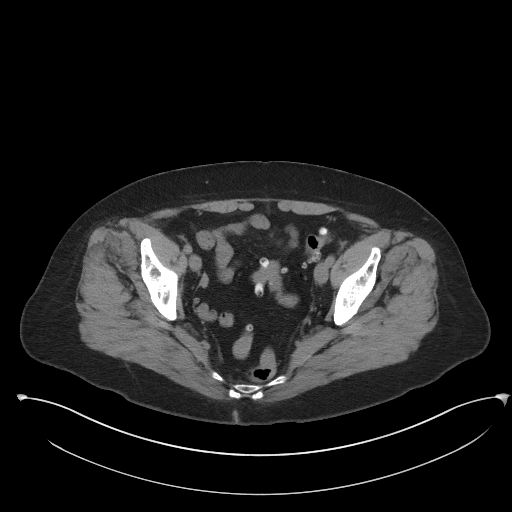
[im 31/93  soft-tissue]
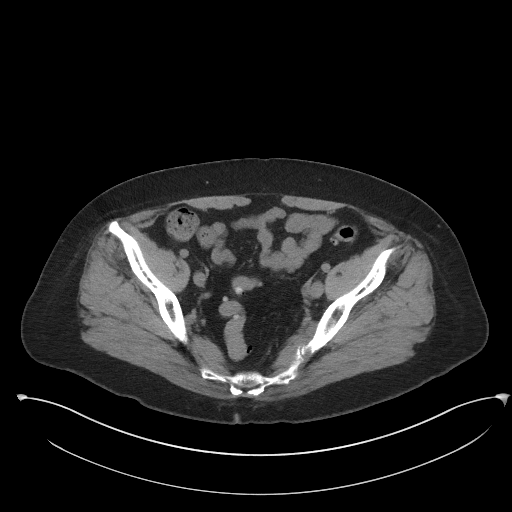
[im 39/93  soft-tissue]
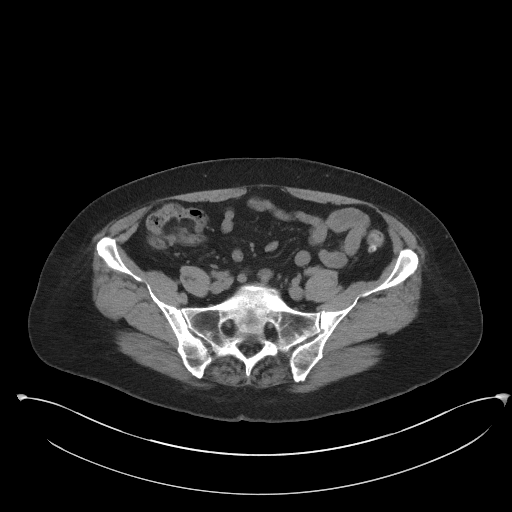
[im 47/93  soft-tissue]
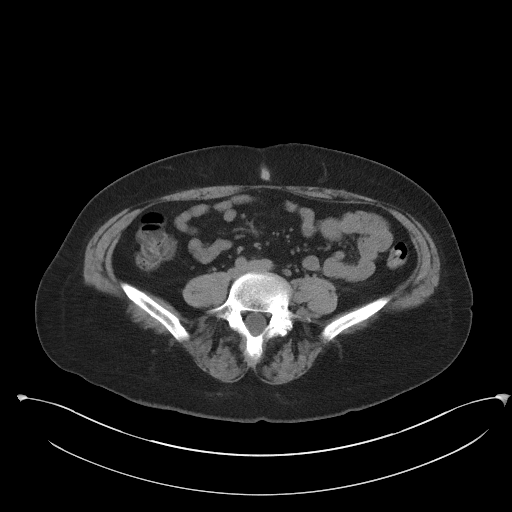
[im 54/93  soft-tissue]
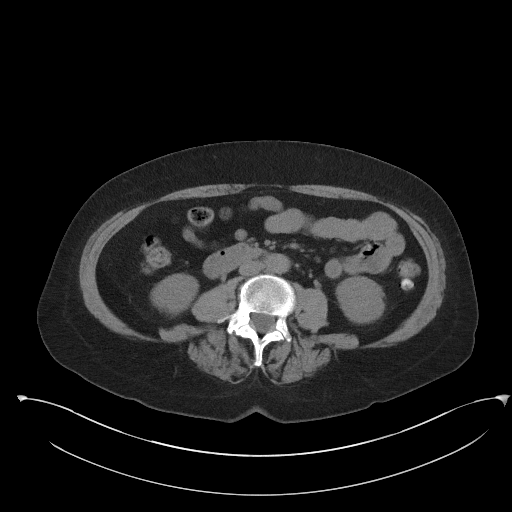
[im 62/93  soft-tissue]
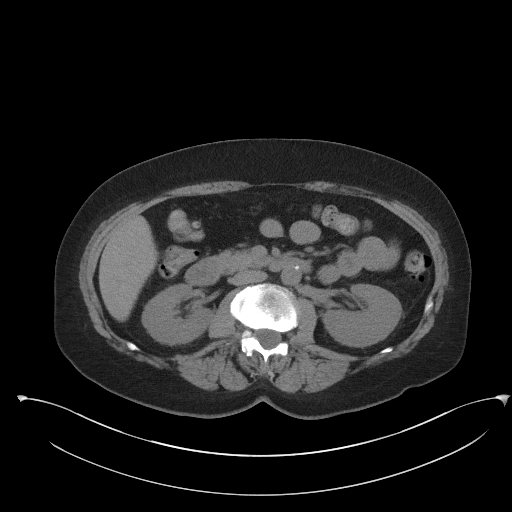
[im 62/93  bone]
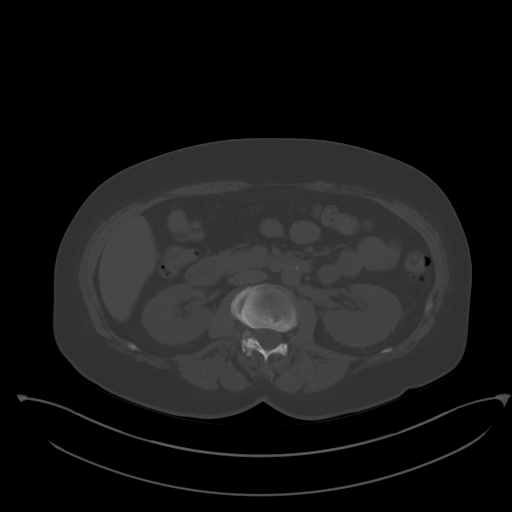
[im 66/93  soft-tissue]
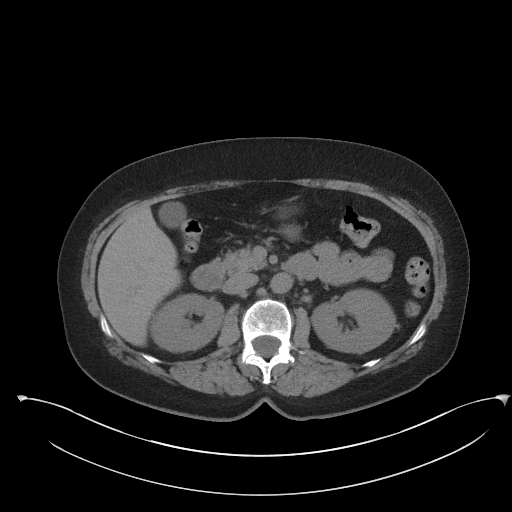
[im 73/93  soft-tissue]
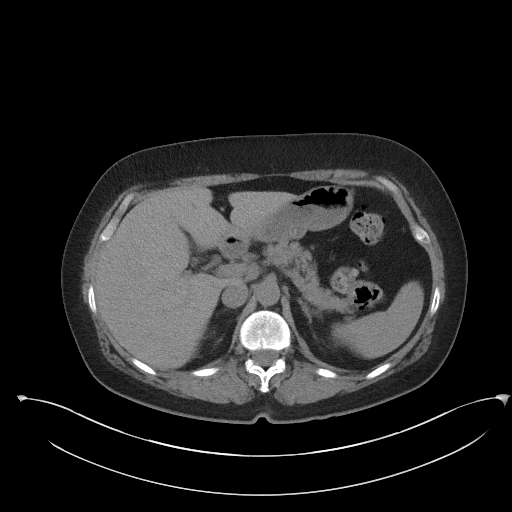
[im 81/93  soft-tissue]
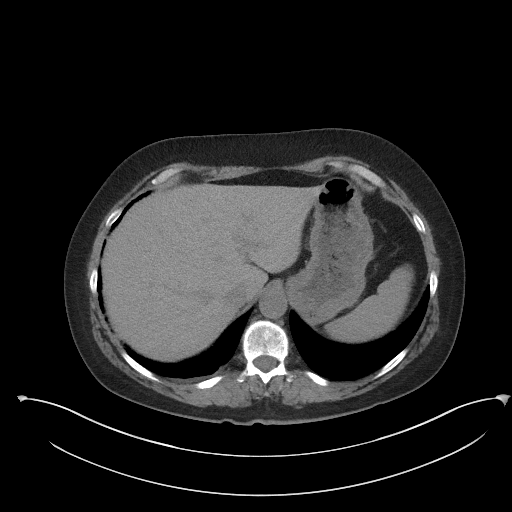
[im 89/93  soft-tissue]
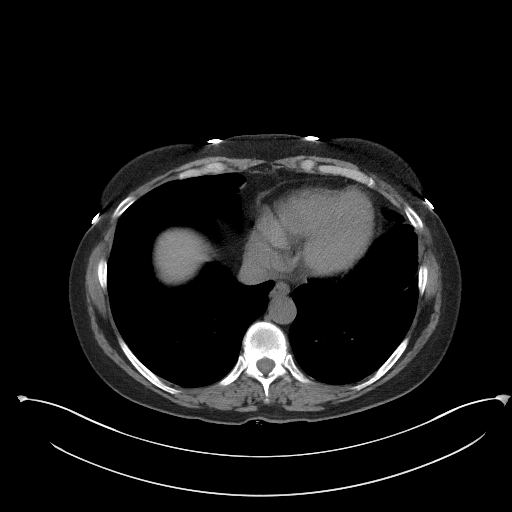

[Series 5: coronal st · coronal · 0.73mm/px · 3 of 85 slices shown]
[im 29/85  soft-tissue]
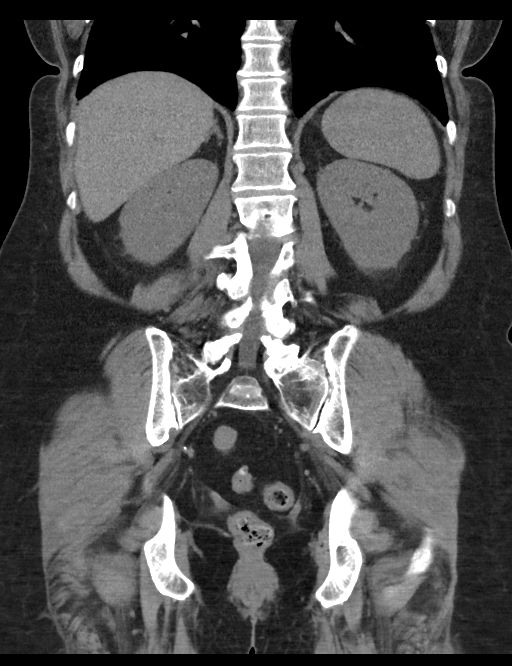
[im 38/85  soft-tissue]
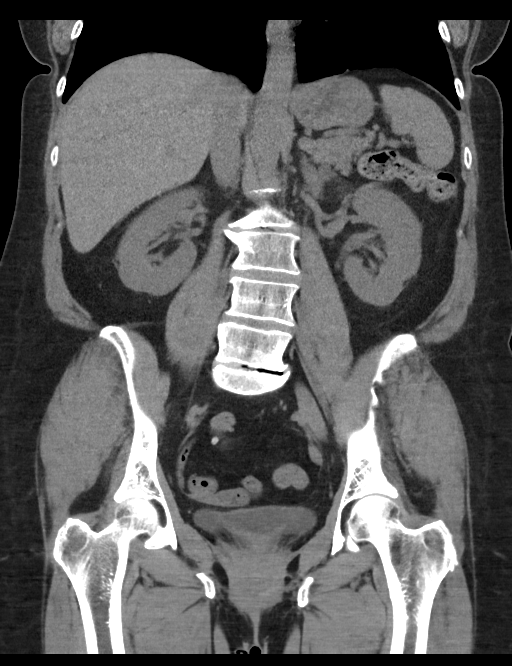
[im 47/85  soft-tissue]
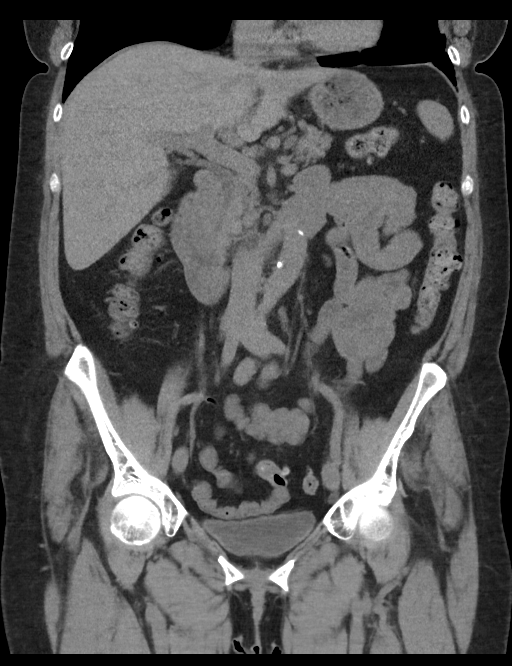

[16 of 46 positions shown; findings below may reference images not displayed]

FINDINGS: Lower chest: No acute abnormality.

Hepatobiliary: No focal liver abnormality is seen. No gallstones,
gallbladder wall thickening, or biliary dilatation.

Pancreas: Unremarkable. No pancreatic ductal dilatation or
surrounding inflammatory changes.

Spleen: Normal in size without focal abnormality.

Adrenals/Urinary Tract: Adrenal glands are unremarkable. Kidneys are
normal, without renal calculi, focal lesion, or hydronephrosis.
Bladder is unremarkable.

Stomach/Bowel: Stomach is within normal limits. Appendix appears
normal. No evidence of bowel wall thickening, distention, or
inflammatory changes. Diverticulosis of descending and sigmoid colon
is noted without inflammation.

Vascular/Lymphatic: No significant vascular findings are present. No
enlarged abdominal or pelvic lymph nodes.

Reproductive: Status post hysterectomy. No adnexal masses.

Other: No abdominal wall hernia or abnormality. No abdominopelvic
ascites.

Musculoskeletal: No acute or significant osseous findings.
IMPRESSION: Diverticulosis of descending and sigmoid colon without inflammation.

No acute abnormality seen in the abdomen or pelvis.

## 2021-06-15 NOTE — Progress Notes (Signed)
HPI: This is a pleasant 61 year old woman  I met her as a new patient almost 2 and half years ago September 2020 for recurrent diverticulitis.  It was pretty clear at that point that she was having recurrent intermittent left-sided colon diverticulitis.  She had had 2 or 3 episodes by that point.  I recommended a colonoscopy since she had not had one for 10 or 15 years.  She scheduled it and then canceled it and we have not heard from her since then.    I was contacted by one of the surgeons here in town Dr. Johney Maine.  He saw her in his office last month, December 2022.  He agreed that she was having episodes of recurrent left-sided diverticulitis.  He recommended elective segmental resection.  That surgery is scheduled for Friday, February 10.  Blood work October 2022 shows normal CBC normal complete metabolic profile except for a potassium of 3.4.  She believes she has had 4 discrete episodes of left-sided diverticulitis.  They seem to respond with antibiotics.  When she has these she has left lower quadrant pains.  She does not have any troubles with her bowels chronically.  No overt GI bleeding.  Her grandmothers sister might have had colon cancer but no one else in the family has had colon cancer   ROS: complete GI ROS as described in HPI, all other review negative.  Constitutional:  No unintentional weight loss   Past Medical History:  Diagnosis Date   Anxiety    Hypertension    Spontaneous pneumothorax    at 61 yo   UTI (urinary tract infection)     Past Surgical History:  Procedure Laterality Date   ABDOMINAL HYSTERECTOMY     partial   BREAST SURGERY Bilateral    breast reduction   LUNG SURGERY Bilateral 1997   REDUCTION MAMMAPLASTY      Current Outpatient Medications  Medication Sig Dispense Refill   aspirin EC 81 MG tablet Take 1 tablet (81 mg total) by mouth daily. 100 tablet 3   b complex vitamins tablet Take 1 tablet by mouth daily. 100 tablet 3   calcium  carbonate (TUMS - DOSED IN MG ELEMENTAL CALCIUM) 500 MG chewable tablet Chew 1 tablet by mouth 3 (three) times daily.     Cholecalciferol (VITAMIN D3) 50 MCG (2000 UT) capsule Take 1 capsule (2,000 Units total) by mouth daily. 100 capsule 3   meloxicam (MOBIC) 15 MG tablet TAKE 1 TABLET BY MOUTH EVERY DAY (Patient taking differently: TAKE 1 TABLET BY MOUTH EVERY DAY as needed) 30 tablet 2   naproxen sodium (ANAPROX) 220 MG tablet Take 220 mg by mouth 2 (two) times daily with a meal.     Probiotic Product (ALIGN) 4 MG CAPS Take 1 capsule (4 mg total) by mouth daily. (Patient taking differently: Take 1 capsule by mouth at bedtime.) 30 capsule 1   psyllium (METAMUCIL) 58.6 % packet Take 1 packet by mouth every other day.     triamterene-hydrochlorothiazide (MAXZIDE-25) 37.5-25 MG tablet TAKE 1 TABLET BY MOUTH EVERY DAY 90 tablet 3   trimethoprim (TRIMPEX) 100 MG tablet Take 100 mg by mouth daily.     valACYclovir (VALTREX) 500 MG tablet TAKE 1 TABLET BY MOUTH AT BEDTIME. 90 tablet 3   No current facility-administered medications for this visit.    Allergies as of 06/15/2021 - Review Complete 06/15/2021  Allergen Reaction Noted   Augmentin [amoxicillin-pot clavulanate]  12/05/2018   Bactrim [sulfamethoxazole-trimethoprim] Nausea Only 11/26/2019  Ciprofloxacin  12/05/2018   Cefdinir Rash 03/21/2021   Flagyl [metronidazole] Rash 12/05/2018    Family History  Problem Relation Age of Onset   Heart disease Mother    Hypertension Mother    Heart disease Brother    Bladder Cancer Maternal Grandmother    Breast cancer Maternal Aunt    Colon cancer Other        grandmothers sister colon cancer   Stomach cancer Neg Hx    Pancreatic cancer Neg Hx     Social History   Socioeconomic History   Marital status: Married    Spouse name: Not on file   Number of children: Not on file   Years of education: Not on file   Highest education level: Not on file  Occupational History   Not on file   Tobacco Use   Smoking status: Every Day    Packs/day: 0.50    Types: Cigarettes   Smokeless tobacco: Never  Vaping Use   Vaping Use: Never used  Substance and Sexual Activity   Alcohol use: Yes    Comment: occ   Drug use: Yes    Frequency: 7.0 times per week    Types: Marijuana    Comment: daily    Sexual activity: Yes  Other Topics Concern   Not on file  Social History Narrative   Not on file   Social Determinants of Health   Financial Resource Strain: Not on file  Food Insecurity: Not on file  Transportation Needs: Not on file  Physical Activity: Not on file  Stress: Not on file  Social Connections: Not on file  Intimate Partner Violence: Not on file     Physical Exam: Ht _0  (1.676 m)    Wt 165 lb (74.8 kg)    LMP 06/05/2008    BMI 26.63 kg/m  Constitutional: generally well-appearing Psychiatric: alert and oriented x3 Abdomen: soft, nontender, nondistended, no obvious ascites, no peritoneal signs, normal bowel sounds No peripheral edema noted in lower extremities  Assessment and plan: 61 y.o. female with recurrent left-sided colonic diverticulitis  She is currently scheduled for elective left colon resection for recurrent left-sided diverticulitis.  It looks like that surgery is scheduled for Friday, February 10.  She would like to have a colonoscopy the day before to "clear her colon".  I had recommended this colonoscopy for her to be done 2 years ago.  Currently my schedule for Thursday, February 9 is full.  I will try to squeeze her in however.  We will see if endoscopy can allow that.  If not then she understands that she will probably have to have colonoscopy a few days prior to her segmental resection requiring separate prepping.  Please see the "Patient Instructions" section for addition details about the plan.  Owens Loffler, MD [facility] 06/15/2021, 10:44 AM   Total time on date of encounter was 30 minutes (this included time spent  preparing to see the patient reviewing records; obtaining and/or reviewing separately obtained history; performing a medically appropriate exam and/or evaluation; counseling and educating the patient and family if present; ordering medications, tests or procedures if applicable; and documenting clinical information in the health record).

## 2021-06-15 NOTE — H&P (View-Only) (Signed)
° °HPI: °This is a pleasant 60-year-old woman ° °I met her as a new patient almost 2 and half years ago September 2020 for recurrent diverticulitis.  It was pretty clear at that point that she was having recurrent intermittent left-sided colon diverticulitis.  She had had 2 or 3 episodes by that point.  I recommended a colonoscopy since she had not had one for 10 or 15 years.  She scheduled it and then canceled it and we have not heard from her since then.   ° °I was contacted by one of the surgeons here in town Dr. Gross.  He saw her in his office last month, December 2022.  He agreed that she was having episodes of recurrent left-sided diverticulitis.  He recommended elective segmental resection.  That surgery is scheduled for Friday, February 10. ° °Blood work October 2022 shows normal CBC normal complete metabolic profile except for a potassium of 3.4. ° °She believes she has had 4 discrete episodes of left-sided diverticulitis.  They seem to respond with antibiotics.  When she has these she has left lower quadrant pains.  She does not have any troubles with her bowels chronically.  No overt GI bleeding. ° °Her grandmothers sister might have had colon cancer but no one else in the family has had colon cancer ° ° °ROS: complete GI ROS as described in HPI, all other review negative. ° °Constitutional:  No unintentional weight loss ° ° °Past Medical History:  °Diagnosis Date  ° Anxiety   ° Hypertension   ° Spontaneous pneumothorax   ° at 61 yo  ° UTI (urinary tract infection)   ° ° °Past Surgical History:  °Procedure Laterality Date  ° ABDOMINAL HYSTERECTOMY    ° partial  ° BREAST SURGERY Bilateral   ° breast reduction  ° LUNG SURGERY Bilateral 1997  ° REDUCTION MAMMAPLASTY    ° ° °Current Outpatient Medications  °Medication Sig Dispense Refill  ° aspirin EC 81 MG tablet Take 1 tablet (81 mg total) by mouth daily. 100 tablet 3  ° b complex vitamins tablet Take 1 tablet by mouth daily. 100 tablet 3  ° calcium  carbonate (TUMS - DOSED IN MG ELEMENTAL CALCIUM) 500 MG chewable tablet Chew 1 tablet by mouth 3 (three) times daily.    ° Cholecalciferol (VITAMIN D3) 50 MCG (2000 UT) capsule Take 1 capsule (2,000 Units total) by mouth daily. 100 capsule 3  ° meloxicam (MOBIC) 15 MG tablet TAKE 1 TABLET BY MOUTH EVERY DAY (Patient taking differently: TAKE 1 TABLET BY MOUTH EVERY DAY as needed) 30 tablet 2  ° naproxen sodium (ANAPROX) 220 MG tablet Take 220 mg by mouth 2 (two) times daily with a meal.    ° Probiotic Product (ALIGN) 4 MG CAPS Take 1 capsule (4 mg total) by mouth daily. (Patient taking differently: Take 1 capsule by mouth at bedtime.) 30 capsule 1  ° psyllium (METAMUCIL) 58.6 % packet Take 1 packet by mouth every other day.    ° triamterene-hydrochlorothiazide (MAXZIDE-25) 37.5-25 MG tablet TAKE 1 TABLET BY MOUTH EVERY DAY 90 tablet 3  ° trimethoprim (TRIMPEX) 100 MG tablet Take 100 mg by mouth daily.    ° valACYclovir (VALTREX) 500 MG tablet TAKE 1 TABLET BY MOUTH AT BEDTIME. 90 tablet 3  ° °No current facility-administered medications for this visit.  ° ° °Allergies as of 06/15/2021 - Review Complete 06/15/2021  °Allergen Reaction Noted  ° Augmentin [amoxicillin-pot clavulanate]  12/05/2018  ° Bactrim [sulfamethoxazole-trimethoprim] Nausea Only 11/26/2019  °   Ciprofloxacin  12/05/2018  ° Cefdinir Rash 03/21/2021  ° Flagyl [metronidazole] Rash 12/05/2018  ° ° °Family History  °Problem Relation Age of Onset  ° Heart disease Mother   ° Hypertension Mother   ° Heart disease Brother   ° Bladder Cancer Maternal Grandmother   ° Breast cancer Maternal Aunt   ° Colon cancer Other   °     grandmothers sister colon cancer  ° Stomach cancer Neg Hx   ° Pancreatic cancer Neg Hx   ° ° °Social History  ° °Socioeconomic History  ° Marital status: Married  °  Spouse name: Not on file  ° Number of children: Not on file  ° Years of education: Not on file  ° Highest education level: Not on file  °Occupational History  ° Not on file   °Tobacco Use  ° Smoking status: Every Day  °  Packs/day: 0.50  °  Types: Cigarettes  ° Smokeless tobacco: Never  °Vaping Use  ° Vaping Use: Never used  °Substance and Sexual Activity  ° Alcohol use: Yes  °  Comment: occ  ° Drug use: Yes  °  Frequency: 7.0 times per week  °  Types: Marijuana  °  Comment: daily   ° Sexual activity: Yes  °Other Topics Concern  ° Not on file  °Social History Narrative  ° Not on file  ° °Social Determinants of Health  ° °Financial Resource Strain: Not on file  °Food Insecurity: Not on file  °Transportation Needs: Not on file  °Physical Activity: Not on file  °Stress: Not on file  °Social Connections: Not on file  °Intimate Partner Violence: Not on file  ° ° ° °Physical Exam: °Ht 5' 6" (1.676 m)    Wt 165 lb (74.8 kg)    LMP 06/05/2008    BMI 26.63 kg/m²  °Constitutional: generally well-appearing °Psychiatric: alert and oriented x3 °Abdomen: soft, nontender, nondistended, no obvious ascites, no peritoneal signs, normal bowel sounds °No peripheral edema noted in lower extremities ° °Assessment and plan: °60 y.o. female with recurrent left-sided colonic diverticulitis ° °She is currently scheduled for elective left colon resection for recurrent left-sided diverticulitis.  It looks like that surgery is scheduled for Friday, February 10.  She would like to have a colonoscopy the day before to "clear her colon".  I had recommended this colonoscopy for her to be done 2 years ago.  Currently my schedule for Thursday, February 9 is full.  I will try to squeeze her in however.  We will see if endoscopy can allow that.  If not then she understands that she will probably have to have colonoscopy a few days prior to her segmental resection requiring separate prepping. ° °Please see the "Patient Instructions" section for addition details about the plan. ° °Calbert Hulsebus, MD °[facility] °06/15/2021, 10:44 AM ° ° °Total time on date of encounter was 30 minutes (this included time spent  preparing to see the patient reviewing records; obtaining and/or reviewing separately obtained history; performing a medically appropriate exam and/or evaluation; counseling and educating the patient and family if present; ordering medications, tests or procedures if applicable; and documenting clinical information in the health record). ° ° ° ° ° ° ° °

## 2021-06-15 NOTE — Patient Instructions (Signed)
If you are age 59 or younger, your body mass index should be between 19-25. Your Body mass index is 26.63 kg/m. If this is out of the aformentioned range listed, please consider follow up with your Primary Care Provider.  ________________________________________________________  [facility] providers would like to encourage you to use [facility] to communicate with providers for non-urgent requests or questions.  Due to long hold times on the telephone, sending your provider a message by [facility] may be a faster and more efficient way to get a response.  Please allow 48 business hours for a response.  Please remember that this is for non-urgent requests.  _______________________________________________________  Tamara Charles have been scheduled for a colonoscopy. Please follow written instructions given to you at your visit today.  Please pick up your prep supplies at the pharmacy within the next 1-3 days. If you use inhalers (even only as needed), please bring them with you on the day of your procedure.  Due to recent changes in healthcare laws, you may see the results of your imaging and laboratory studies on MyChart before your provider has had a chance to review them.  We understand that in some cases there may be results that are confusing or concerning to you. Not all laboratory results come back in the same time frame and the provider may be waiting for multiple results in order to interpret others.  Please give [place] 48 hours in order for your provider to thoroughly review all the results before contacting the office for clarification of your results.   Thank you for entrusting me with your care and choosing [facility].  Dr Ardis Hughs

## 2021-06-16 ENCOUNTER — Ambulatory Visit: Payer: Self-pay | Admitting: Surgery

## 2021-06-16 DIAGNOSIS — R739 Hyperglycemia, unspecified: Secondary | ICD-10-CM

## 2021-06-16 NOTE — Progress Notes (Signed)
Covid testing on 07/13/2021 at 0800am.  Come thru Main Entrance at [facility].  Have a seat in the lobby on the  right .  Call [phone] and give them your name and let them know you are here for covid testing                Tamara Charles  06/16/2021   Your procedure is scheduled on:         07/14/2021.   Report to [facility] Main  Entrance   Report to admitting at    [phone]     Call this number if you have problems the morning of surgery [phone]           REMEMBER: FOLLOW ALL YOUR BOWEL PREP INSTRUCTIONS WITH CLEAR LIQUIDS FROM YOUR SURGEON'S INSTRUCTIONS. DRINK 2 PRESURGERY ENSURE DRINKS THE NIGHT BEFORE SURGERY AT 1000 PM AND 1 PRESURGERY DRINK THE DAY OF THE PROCEDURE 3 HOURS PRIOR TO SCHEDULED SURGERY.  NOTHING BY MOUTH EXCEPT CLEAR LIQUIDS UNTIL THREE HOURS PRIOR TO SCHEDULED SURGERY. PLEASE FINISH PRESURGERY 3RD  ENSURE  DRINK PER SURGEON ORDER 3 HOURS PRIOR TO SCHEDULED SURGERY TIME WHICH NEEDS TO BE COMPLETED AT __0430am_______.     CLEAR LIQUID DIET   Foods Allowed                                                                      Coffee and tea, regular and decaf                           Plain Jell-O any favor except red or purple                                         Fruit ices (not with fruit pulp)                                      Iced Popsicles                                     Carbonated beverages, regular and diet                                    Cranberry, grape and apple juices Sports drinks like Gatorade Lightly seasoned clear broth or consume(fat free) Sugar  _____________________________________________________________________      BRUSH YOUR TEETH MORNING OF SURGERY AND RINSE YOUR MOUTH OUT, NO CHEWING GUM CANDY OR MINTS.     Take these medicines the morning of surgery with A SIP OF WATER:  none   DO NOT TAKE ANY DIABETIC MEDICATIONS DAY OF YOUR SURGERY                               You may not have any metal on your body  including hair pins  and              piercings  Do not wear jewelry, make-up, lotions, powders or perfumes, deodorant             Do not wear nail polish on your fingernails.  Do not shave  48 hours prior to surgery.              Men may shave face and neck.   Do not bring valuables to the hospital. [place].  Contacts, dentures or bridgework may not be worn into surgery.  Leave suitcase in the car. After surgery it may be brought to your room.                 Please read over the following fact sheets you were given: _____________________________________________________________________  [facility] - Preparing for Surgery Before surgery, you can play an important role.  Because skin is not sterile, your skin needs to be as free of germs as possible.  You can reduce the number of germs on your skin by washing with CHG (chlorahexidine gluconate) soap before surgery.  CHG is an antiseptic cleaner which kills germs and bonds with the skin to continue killing germs even after washing. Please DO NOT use if you have an allergy to CHG or antibacterial soaps.  If your skin becomes reddened/irritated stop using the CHG and inform your nurse when you arrive at Short Stay. Do not shave (including legs and underarms) for at least 48 hours prior to the first CHG shower.  You may shave your face/neck. Please follow these instructions carefully:  1.  Shower with CHG Soap the night before surgery and the  morning of Surgery.  2.  If you choose to wash your hair, wash your hair first as usual with your  normal  shampoo.  3.  After you shampoo, rinse your hair and body thoroughly to remove the  shampoo.                           4.  Use CHG as you would any other liquid soap.  You can apply chg directly  to the skin and wash                       Gently with a scrungie or clean washcloth.  5.  Apply the CHG Soap to your body ONLY FROM THE NECK DOWN.   Do not use  on face/ open                           Wound or open sores. Avoid contact with eyes, ears mouth and genitals (private parts).                       Wash face,  Genitals (private parts) with your normal soap.             6.  Wash thoroughly, paying special attention to the area where your surgery  will be performed.  7.  Thoroughly rinse your body with warm water from the neck down.  8.  DO NOT shower/wash with your normal soap after using and rinsing off  the CHG Soap.  9.  Pat yourself dry with a clean towel.            10.  Wear clean pajamas.            11.  Place clean sheets on your bed the night of your first shower and do not  sleep with pets. Day of Surgery : Do not apply any lotions/deodorants the morning of surgery.  Please wear clean clothes to the hospital/surgery center.  FAILURE TO FOLLOW THESE INSTRUCTIONS MAY RESULT IN THE CANCELLATION OF YOUR SURGERY PATIENT SIGNATURE_________________________________  NURSE SIGNATURE__________________________________  ________________________________________________________________________

## 2021-06-16 NOTE — Progress Notes (Addendum)
Anesthesia Review:  PCP: DR Plotnikov  Cardiologist : none  Chest x-ray : EKG : 06/20/21.  Echo : Stress test: Cardiac Cath :  Activity level: can do ao flgiht of stairs without difficulty  Sleep Study/ CPAP : none  Fasting Blood Sugar :      / Checks Blood Sugar -- times a day:   Blood Thinner/ Instructions /Last Dose: ASA / Instructions/ Last Dose :   Covid test - 07/13/21 at 0800am  81 mg Aspirin  Smoker  Bowel prep on 07/13/2021 for colonoscopy on 07/14/2021.  After colonoscopy on 07/14/21 pt to remain on clear liquids per pt for surgery on 07/15/21. PT reports at preop has bowel prep instructions.   Called [facility] and spoke with Elmyra Ricks in triage  and asked for clarification of above to [facility] sure pt understands bowel prep instructions correctly. Elmyra Ricks to call me back.   Called [facility] on 06/22/2021 and spoke with Claiborne Billings in Triage and she stated above was correct regarding bowel prep, colonoscopy then surgery.  Hgba1c-5.6 on 06/20/21.

## 2021-06-20 ENCOUNTER — Other Ambulatory Visit (HOSPITAL_COMMUNITY): Payer: Self-pay

## 2021-06-20 ENCOUNTER — Other Ambulatory Visit: Payer: Self-pay

## 2021-06-20 ENCOUNTER — Encounter (HOSPITAL_COMMUNITY): Payer: Self-pay

## 2021-06-20 ENCOUNTER — Encounter (HOSPITAL_COMMUNITY)
Admission: RE | Admit: 2021-06-20 | Discharge: 2021-06-20 | Disposition: A | Discharge status: Home / Self Care | Payer: 59 | Source: Ambulatory Visit | Attending: Surgery | Admitting: Surgery

## 2021-06-20 VITALS — BP 132/90 | HR 57 | Temp 98.1°F | Resp 16 | Ht 66.5 in | Wt 166.5 lb

## 2021-06-20 DIAGNOSIS — Z01818 Encounter for other preprocedural examination: Secondary | ICD-10-CM | POA: Insufficient documentation

## 2021-06-20 DIAGNOSIS — R739 Hyperglycemia, unspecified: Secondary | ICD-10-CM | POA: Insufficient documentation

## 2021-06-20 DIAGNOSIS — Z20822 Contact with and (suspected) exposure to covid-19: Secondary | ICD-10-CM | POA: Insufficient documentation

## 2021-06-20 HISTORY — DX: Gastro-esophageal reflux disease without esophagitis: K21.9

## 2021-06-20 HISTORY — DX: Unspecified osteoarthritis, unspecified site: M19.90

## 2021-06-20 LAB — BASIC METABOLIC PANEL
Anion gap: 8 (ref 5–15)
BUN: 15 mg/dL (ref 6–20)
CO2: 27 mmol/L (ref 22–32)
Calcium: 9.7 mg/dL (ref 8.9–10.3)
Chloride: 104 mmol/L (ref 98–111)
Creatinine, Ser: 0.78 mg/dL (ref 0.44–1.00)
GFR, Estimated: >60 mL/min (ref >60)
Glucose, Bld: 98 mg/dL (ref 70–99)
Potassium: 4.2 mmol/L (ref 3.5–5.1)
Sodium: 139 mmol/L (ref 135–145)

## 2021-06-20 LAB — CBC
HCT: 39.9 % (ref 36.0–46.0)
Hemoglobin: 13.5 g/dL (ref 12.0–15.0)
MCH: 33.8 pg (ref 26.0–34.0)
MCHC: 33.8 g/dL (ref 30.0–36.0)
MCV: 99.8 fL (ref 80.0–100.0)
Platelets: 307 10*3/uL (ref 150–400)
RBC: 4 MIL/uL (ref 3.87–5.11)
RDW: 12.4 % (ref 11.5–15.5)
WBC: 6.1 10*3/uL (ref 4.0–10.5)
nRBC: 0 % (ref 0.0–0.2)

## 2021-06-20 LAB — HEMOGLOBIN A1C
Hgb A1c MFr Bld: 5.6 % (ref 4.8–5.6)
Mean Plasma Glucose: 114.02 mg/dL

## 2021-07-05 ENCOUNTER — Encounter (HOSPITAL_COMMUNITY): Payer: Self-pay | Admitting: Gastroenterology

## 2021-07-13 ENCOUNTER — Encounter (HOSPITAL_COMMUNITY)
Admission: RE | Admit: 2021-07-13 | Discharge: 2021-07-13 | Disposition: A | Discharge status: Home / Self Care | Payer: 59 | Source: Ambulatory Visit | Attending: Surgery | Admitting: Surgery

## 2021-07-13 ENCOUNTER — Other Ambulatory Visit: Payer: Self-pay

## 2021-07-13 LAB — SARS CORONAVIRUS 2 (TAT 6-24 HRS): SARS Coronavirus 2: NEGATIVE

## 2021-07-13 NOTE — Anesthesia Preprocedure Evaluation (Addendum)
Anesthesia Evaluation  Patient identified by MRN, date of birth, ID band Patient awake    Reviewed: Allergy & Precautions, NPO status , Patient's Chart, lab work & pertinent test results  History of Anesthesia Complications Negative for: history of anesthetic complications  Airway Mallampati: II  TM Distance: >3 FB Neck ROM: Full    Dental  (+) Missing,    Pulmonary Current Smoker,    Pulmonary exam normal        Cardiovascular hypertension, Pt. on medications Normal cardiovascular exam     Neuro/Psych negative neurological ROS  negative psych ROS   GI/Hepatic Neg liver ROS, GERD  Medicated and Controlled,recurrent diverticulitis   Endo/Other  negative endocrine ROS  Renal/GU negative Renal ROS  negative genitourinary   Musculoskeletal  (+) Arthritis ,   Abdominal   Peds  Hematology negative hematology ROS (+)   Anesthesia Other Findings Day of surgery medications reviewed with patient.  Reproductive/Obstetrics negative OB ROS                            Anesthesia Physical Anesthesia Plan  ASA: 2  Anesthesia Plan: MAC   Post-op Pain Management: Minimal or no pain anticipated   Induction:   PONV Risk Score and Plan: Treatment may vary due to age or medical condition and Propofol infusion  Airway Management Planned: Natural Airway and Nasal Cannula  Additional Equipment: None  Intra-op Plan:   Post-operative Plan:   Informed Consent: I have reviewed the patients History and Physical, chart, labs and discussed the procedure including the risks, benefits and alternatives for the proposed anesthesia with the patient or authorized representative who has indicated his/her understanding and acceptance.       Plan Discussed with: CRNA  Anesthesia Plan Comments:        Anesthesia Quick Evaluation

## 2021-07-14 ENCOUNTER — Ambulatory Visit (HOSPITAL_COMMUNITY): Payer: 59 | Admitting: Anesthesiology

## 2021-07-14 ENCOUNTER — Ambulatory Visit (HOSPITAL_COMMUNITY)
Admission: RE | Admit: 2021-07-14 | Discharge: 2021-07-14 | Disposition: A | Discharge status: Home / Self Care | Payer: 59 | Attending: Gastroenterology | Admitting: Gastroenterology

## 2021-07-14 ENCOUNTER — Encounter (HOSPITAL_COMMUNITY)
Admission: RE | Disposition: A | Discharge status: Home / Self Care | Payer: Self-pay | Source: Home / Self Care | Attending: Gastroenterology

## 2021-07-14 ENCOUNTER — Encounter (HOSPITAL_COMMUNITY): Payer: Self-pay | Admitting: Gastroenterology

## 2021-07-14 ENCOUNTER — Ambulatory Visit (HOSPITAL_COMMUNITY): Payer: 59 | Admitting: Physician Assistant

## 2021-07-14 DIAGNOSIS — Z1211 Encounter for screening for malignant neoplasm of colon: Secondary | ICD-10-CM | POA: Diagnosis present

## 2021-07-14 DIAGNOSIS — K219 Gastro-esophageal reflux disease without esophagitis: Secondary | ICD-10-CM | POA: Insufficient documentation

## 2021-07-14 DIAGNOSIS — K5792 Diverticulitis of intestine, part unspecified, without perforation or abscess without bleeding: Secondary | ICD-10-CM

## 2021-07-14 DIAGNOSIS — Z20822 Contact with and (suspected) exposure to covid-19: Secondary | ICD-10-CM | POA: Insufficient documentation

## 2021-07-14 DIAGNOSIS — D120 Benign neoplasm of cecum: Secondary | ICD-10-CM | POA: Insufficient documentation

## 2021-07-14 DIAGNOSIS — K635 Polyp of colon: Secondary | ICD-10-CM | POA: Diagnosis not present

## 2021-07-14 DIAGNOSIS — Z01812 Encounter for preprocedural laboratory examination: Secondary | ICD-10-CM

## 2021-07-14 DIAGNOSIS — K5732 Diverticulitis of large intestine without perforation or abscess without bleeding: Secondary | ICD-10-CM | POA: Insufficient documentation

## 2021-07-14 DIAGNOSIS — I10 Essential (primary) hypertension: Secondary | ICD-10-CM | POA: Diagnosis not present

## 2021-07-14 DIAGNOSIS — F17210 Nicotine dependence, cigarettes, uncomplicated: Secondary | ICD-10-CM | POA: Diagnosis not present

## 2021-07-14 HISTORY — PX: COLONOSCOPY WITH PROPOFOL: SHX5780

## 2021-07-14 HISTORY — PX: POLYPECTOMY: SHX5525

## 2021-07-14 SURGERY — COLONOSCOPY WITH PROPOFOL
Anesthesia: Monitor Anesthesia Care

## 2021-07-14 MED ORDER — LIDOCAINE 2% (20 MG/ML) 5 ML SYRINGE
INTRAMUSCULAR | Status: DC | PRN
  Administered 2021-07-14: 60 mg via INTRAVENOUS

## 2021-07-14 MED ORDER — LACTATED RINGERS IV SOLN
INTRAVENOUS | Status: DC
  Administered 2021-07-14: 1000 mL via INTRAVENOUS

## 2021-07-14 MED ORDER — PROPOFOL 1000 MG/100ML IV EMUL
INTRAVENOUS | Status: AC
  Filled 2021-07-14: qty 100

## 2021-07-14 MED ORDER — PROPOFOL 10 MG/ML IV BOLUS
INTRAVENOUS | Status: DC | PRN
Start: 2021-07-14 — End: 2021-07-14
  Administered 2021-07-14 (×2): 20 mg via INTRAVENOUS
  Administered 2021-07-14: 10 mg via INTRAVENOUS

## 2021-07-14 MED ORDER — ONDANSETRON HCL 4 MG/2ML IJ SOLN
INTRAMUSCULAR | Status: DC | PRN
  Administered 2021-07-14: 4 mg via INTRAVENOUS

## 2021-07-14 MED ORDER — SODIUM CHLORIDE 0.9 % IV SOLN: INTRAVENOUS | Status: DC

## 2021-07-14 MED ORDER — PROPOFOL 500 MG/50ML IV EMUL
INTRAVENOUS | Status: DC | PRN
  Administered 2021-07-14: 125 ug/kg/min via INTRAVENOUS

## 2021-07-14 SURGICAL SUPPLY — 22 items

## 2021-07-14 NOTE — Anesthesia Postprocedure Evaluation (Signed)
Anesthesia Post Note  Patient: Tamara Charles  Procedure(s) Performed: COLONOSCOPY WITH PROPOFOL POLYPECTOMY     Patient location during evaluation: PACU Anesthesia Type: MAC Level of consciousness: awake and alert Pain management: pain level controlled Vital Signs Assessment: post-procedure vital signs reviewed and stable Respiratory status: spontaneous breathing, nonlabored ventilation and respiratory function stable Cardiovascular status: blood pressure returned to baseline Postop Assessment: no apparent nausea or vomiting Anesthetic complications: no   No notable events documented.  Last Vitals:  Vitals:   07/14/21 0940 07/14/21 0950  BP: 132/83 (!) 149/62  Pulse: 66 (!) 57  Resp: 14 13  Temp:    SpO2: 98% 97%    Last Pain:  Vitals:   07/14/21 0950  TempSrc:   PainSc: 0-No pain                 Marthenia Rolling

## 2021-07-14 NOTE — Interval H&P Note (Signed)
History and Physical Interval Note:  07/14/2021 8:04 AM  Tamara Charles  has presented today for surgery, with the diagnosis of recurrent diverticulitis.  The various methods of treatment have been discussed with the patient and family. After consideration of risks, benefits and other options for treatment, the patient has consented to  Procedure(s): COLONOSCOPY WITH PROPOFOL (N/A) as a surgical intervention.  The patient's history has been reviewed, patient examined, no change in status, stable for surgery.  I have reviewed the patient's chart and labs.  Questions were answered to the patient's satisfaction.     Milus Banister

## 2021-07-14 NOTE — Op Note (Signed)
[facility] Patient Name: Tamara Charles Procedure Date: 07/14/2021 MRN: [id number] Attending MD: Milus Banister , MD Date of Birth: 12-Jan-1961 CSN: [id number] Age: 61 Admit Type: Outpatient Procedure:                Colonoscopy Indications:              Screening for colorectal malignant neoplasm;                            recurrent left colon diverticulitis Providers:                Milus Banister, MD, Doristine Johns, RN, Luan Moore, Technician, Virgia Land, CRNA Referring MD:              Medicines:                Monitored Anesthesia Care Complications:            No immediate complications. Estimated blood loss:                            None. Estimated Blood Loss:     Estimated blood loss: none. Procedure:                Pre-Anesthesia Assessment:                           - Prior to the procedure, a History and Physical                            was performed, and patient medications and                            allergies were reviewed. The patient's tolerance of                            previous anesthesia was also reviewed. The risks                            and benefits of the procedure and the sedation                            options and risks were discussed with the patient.                            All questions were answered, and informed consent                            was obtained. Prior Anticoagulants: The patient has                            taken no previous anticoagulant or antiplatelet                            agents. ASA Grade  Assessment: II - A patient with                            mild systemic disease. After reviewing the risks                            and benefits, the patient was deemed in                            satisfactory condition to undergo the procedure.                           After obtaining informed consent, the colonoscope                            was passed  under direct vision. Throughout the                            procedure, the patient's blood pressure, pulse, and                            oxygen saturations were monitored continuously. The                            CF-HQ190L ([id number]) Olympus colonoscope was                            introduced through the anus and advanced to the the                            cecum, identified by appendiceal orifice and                            ileocecal valve. The colonoscopy was performed                            without difficulty. The patient tolerated the                            procedure well. The quality of the bowel                            preparation was good. The ileocecal valve,                            appendiceal orifice, and rectum were photographed. Scope In: 9:10:22 AM Scope Out: 9:23:38 AM Scope Withdrawal Time: 0 hours 6 minutes 54 seconds  Total Procedure Duration: 0 hours 13 minutes 16 seconds  Findings:      A 3 mm polyp was found in the cecum. The polyp was sessile. The polyp       was removed with a cold snare. Resection and retrieval were complete.      Multiple small and large-mouthed diverticula were found in the entire       colon. This was most impressive  in the left colon where there was       associated mucosal edema, mild luninal narrowing.      The exam was otherwise without abnormality on direct and retroflexion       views. Impression:               - One 3 mm polyp in the cecum, removed with a cold                            snare. Resected and retrieved.                           - Diverticulosis in the entire examined colon. This                            was most impressive in the left colon where there                            was associated mucosal edema, mild luninal                            narrowing.                           - The examination was otherwise normal on direct                            and retroflexion views. Moderate  Sedation:      Not Applicable - Patient had care per Anesthesia. Recommendation:           - Patient has a contact number available for                            emergencies. The signs and symptoms of potential                            delayed complications were discussed with the                            patient. Return to normal activities tomorrow.                            Written discharge instructions were provided to the                            patient.                           - Resume previous diet.                           - Continue present medications.                           - Await pathology results.                           -  OK to proceed with your elective colon resection                            surgery tomorrow. Procedure Code(s):        --- Professional ---                           [id number], Colonoscopy, flexible; with removal of                            tumor(s), polyp(s), or other lesion(s) by snare                            technique Diagnosis Code(s):        --- Professional ---                           Z12.11, Encounter for screening for malignant                            neoplasm of colon                           K63.5, Polyp of colon                           K57.30, Diverticulosis of large intestine without                            perforation or abscess without bleeding CPT copyright 2019 [employer]. All rights reserved. The codes documented in this report are preliminary and upon coder review may  be revised to meet current compliance requirements. Milus Banister, MD 07/14/2021 9:32:58 AM This report has been signed electronically. Number of Addenda: 0

## 2021-07-14 NOTE — Discharge Instructions (Signed)
YOU HAD AN ENDOSCOPIC PROCEDURE TODAY: Refer to the procedure report and other information in the discharge instructions given to you for any specific questions about what was found during the examination. If this information does not answer your questions, please call [facility] office at [phone] to clarify.  ° °YOU SHOULD EXPECT: Some feelings of bloating in the abdomen. Passage of more gas than usual. Walking can help get rid of the air that was put into your GI tract during the procedure and reduce the bloating. If you had a lower endoscopy (such as a colonoscopy or flexible sigmoidoscopy) you may notice spotting of blood in your stool or on the toilet paper. Some abdominal soreness may be present for a day or two, also. ° °DIET: Your first meal following the procedure should be a light meal and then it is ok to progress to your normal diet. A half-sandwich or bowl of soup is an example of a good first meal. Heavy or fried foods are harder to digest and may make you feel nauseous or bloated. Drink plenty of fluids but you should avoid alcoholic beverages for 24 hours. If you had a esophageal dilation, please see attached instructions for diet.   ° °ACTIVITY: Your care partner should take you home directly after the procedure. You should plan to take it easy, moving slowly for the rest of the day. You can resume normal activity the day after the procedure however YOU SHOULD NOT DRIVE, use power tools, machinery or perform tasks that involve climbing or major physical exertion for 24 hours (because of the sedation medicines used during the test).  ° °SYMPTOMS TO REPORT IMMEDIATELY: °A gastroenterologist can be reached at any hour. Please call [phone]  for any of the following symptoms:  °Following lower endoscopy (colonoscopy, flexible sigmoidoscopy) °Excessive amounts of blood in the stool  °Significant tenderness, worsening of abdominal pains  °Swelling of the abdomen that is new, acute  °Fever of 100° or  higher  °Following upper endoscopy (EGD, EUS, ERCP, esophageal dilation) °Vomiting of blood or coffee ground material  °New, significant abdominal pain  °New, significant chest pain or pain under the shoulder blades  °Painful or persistently difficult swallowing  °New shortness of breath  °Black, tarry-looking or red, bloody stools ° °FOLLOW UP:  °If any biopsies were taken you will be contacted by phone or by letter within the next 1-3 weeks. Call [phone]  if you have not heard about the biopsies in 3 weeks.  °Please also call with any specific questions about appointments or follow up tests. ° °

## 2021-07-14 NOTE — Transfer of Care (Signed)
Immediate Anesthesia Transfer of Care Note  Patient: Tamara Charles  Procedure(s) Performed: COLONOSCOPY WITH PROPOFOL POLYPECTOMY  Patient Location: PACU  Anesthesia Type:MAC  Level of Consciousness: awake, alert  and oriented  Airway & Oxygen Therapy: Patient Spontanous Breathing and Patient connected to face mask oxygen  Post-op Assessment: Report given to RN and Post -op Vital signs reviewed and stable  Post vital signs: stable  Last Vitals:  Vitals Value Taken Time  BP 127/69 07/14/21 0930  Temp 36.7 C 07/14/21 0930  Pulse 63 07/14/21 0932  Resp 21 07/14/21 0932  SpO2 100 % 07/14/21 0932  Vitals shown include unvalidated device data.  Last Pain:  Vitals:   07/14/21 0930  TempSrc: Tympanic  PainSc: 0-No pain         Complications: No notable events documented.

## 2021-07-15 ENCOUNTER — Inpatient Hospital Stay (HOSPITAL_COMMUNITY)
Admission: RE | Admit: 2021-07-15 | Discharge: 2021-07-17 | DRG: 331 | Disposition: A | Discharge status: Home / Self Care | Payer: 59 | Attending: General Surgery | Admitting: General Surgery

## 2021-07-15 ENCOUNTER — Other Ambulatory Visit: Payer: Self-pay

## 2021-07-15 ENCOUNTER — Other Ambulatory Visit (HOSPITAL_COMMUNITY): Payer: Self-pay

## 2021-07-15 ENCOUNTER — Inpatient Hospital Stay (HOSPITAL_COMMUNITY): Payer: 59 | Admitting: Physician Assistant

## 2021-07-15 ENCOUNTER — Encounter (HOSPITAL_COMMUNITY)
Admission: RE | Disposition: A | Discharge status: Home / Self Care | Payer: Self-pay | Source: Home / Self Care | Attending: Surgery

## 2021-07-15 ENCOUNTER — Encounter (HOSPITAL_COMMUNITY): Payer: Self-pay | Admitting: Surgery

## 2021-07-15 DIAGNOSIS — R600 Localized edema: Secondary | ICD-10-CM | POA: Diagnosis present

## 2021-07-15 DIAGNOSIS — Z87440 Personal history of urinary (tract) infections: Secondary | ICD-10-CM | POA: Diagnosis not present

## 2021-07-15 DIAGNOSIS — Z791 Long term (current) use of non-steroidal anti-inflammatories (NSAID): Secondary | ICD-10-CM

## 2021-07-15 DIAGNOSIS — R1032 Left lower quadrant pain: Secondary | ICD-10-CM | POA: Diagnosis present

## 2021-07-15 DIAGNOSIS — Z90710 Acquired absence of both cervix and uterus: Secondary | ICD-10-CM

## 2021-07-15 DIAGNOSIS — K660 Peritoneal adhesions (postprocedural) (postinfection): Secondary | ICD-10-CM

## 2021-07-15 DIAGNOSIS — K5732 Diverticulitis of large intestine without perforation or abscess without bleeding: Secondary | ICD-10-CM

## 2021-07-15 DIAGNOSIS — Z880 Allergy status to penicillin: Secondary | ICD-10-CM

## 2021-07-15 DIAGNOSIS — Z800 Family history of malignant neoplasm of digestive organs: Secondary | ICD-10-CM

## 2021-07-15 DIAGNOSIS — Z8249 Family history of ischemic heart disease and other diseases of the circulatory system: Secondary | ICD-10-CM | POA: Diagnosis not present

## 2021-07-15 DIAGNOSIS — Z881 Allergy status to other antibiotic agents status: Secondary | ICD-10-CM | POA: Diagnosis not present

## 2021-07-15 DIAGNOSIS — K219 Gastro-esophageal reflux disease without esophagitis: Secondary | ICD-10-CM | POA: Diagnosis present

## 2021-07-15 DIAGNOSIS — I10 Essential (primary) hypertension: Secondary | ICD-10-CM | POA: Diagnosis present

## 2021-07-15 DIAGNOSIS — R739 Hyperglycemia, unspecified: Secondary | ICD-10-CM

## 2021-07-15 DIAGNOSIS — F17210 Nicotine dependence, cigarettes, uncomplicated: Secondary | ICD-10-CM | POA: Diagnosis present

## 2021-07-15 DIAGNOSIS — Z882 Allergy status to sulfonamides status: Secondary | ICD-10-CM | POA: Diagnosis not present

## 2021-07-15 DIAGNOSIS — M1990 Unspecified osteoarthritis, unspecified site: Secondary | ICD-10-CM | POA: Diagnosis present

## 2021-07-15 DIAGNOSIS — Z79899 Other long term (current) drug therapy: Secondary | ICD-10-CM | POA: Diagnosis not present

## 2021-07-15 DIAGNOSIS — I700 Atherosclerosis of aorta: Secondary | ICD-10-CM | POA: Diagnosis present

## 2021-07-15 DIAGNOSIS — D120 Benign neoplasm of cecum: Secondary | ICD-10-CM | POA: Diagnosis present

## 2021-07-15 DIAGNOSIS — Z20822 Contact with and (suspected) exposure to covid-19: Secondary | ICD-10-CM | POA: Diagnosis present

## 2021-07-15 DIAGNOSIS — Z8619 Personal history of other infectious and parasitic diseases: Secondary | ICD-10-CM | POA: Diagnosis not present

## 2021-07-15 HISTORY — PX: PROCTOSCOPY: SHX2266

## 2021-07-15 LAB — TYPE AND SCREEN
ABO/RH(D): A POS
Antibody Screen: NEGATIVE

## 2021-07-15 LAB — SURGICAL PATHOLOGY

## 2021-07-15 LAB — ABO/RH: ABO/RH(D): A POS

## 2021-07-15 SURGERY — COLECTOMY, SIGMOID, ROBOT-ASSISTED
Anesthesia: General | Site: Rectum

## 2021-07-15 MED ORDER — HYDROMORPHONE HCL 1 MG/ML IJ SOLN
0.5000 mg | INTRAMUSCULAR | Status: DC | PRN
  Administered 2021-07-15: 1 mg via INTRAVENOUS
  Filled 2021-07-15: qty 1

## 2021-07-15 MED ORDER — MIDAZOLAM HCL 5 MG/5ML IJ SOLN
INTRAMUSCULAR | Status: DC | PRN
  Administered 2021-07-15: 2 mg via INTRAVENOUS

## 2021-07-15 MED ORDER — VITAMIN B-12 1000 MCG PO TABS
2000.0000 ug | ORAL_TABLET | Freq: Every day | ORAL | Status: DC
  Administered 2021-07-16: 2000 ug via ORAL
  Filled 2021-07-15: qty 2

## 2021-07-15 MED ORDER — PROCHLORPERAZINE EDISYLATE 10 MG/2ML IJ SOLN
5.0000 mg | Freq: Four times a day (QID) | INTRAMUSCULAR | Status: DC | PRN
  Administered 2021-07-15: 5 mg via INTRAVENOUS
  Filled 2021-07-15: qty 2

## 2021-07-15 MED ORDER — TRIAMTERENE-HCTZ 37.5-25 MG PO TABS
1.0000 | ORAL_TABLET | Freq: Every day | ORAL | Status: DC
  Filled 2021-07-15: qty 1

## 2021-07-15 MED ORDER — LIDOCAINE HCL 2 % IJ SOLN
INTRAMUSCULAR | Status: AC
  Filled 2021-07-15: qty 20

## 2021-07-15 MED ORDER — KETAMINE HCL 10 MG/ML IJ SOLN
INTRAMUSCULAR | Status: DC | PRN
  Administered 2021-07-15: 30 mg via INTRAVENOUS

## 2021-07-15 MED ORDER — ROCURONIUM BROMIDE 10 MG/ML (PF) SYRINGE
PREFILLED_SYRINGE | INTRAVENOUS | Status: DC | PRN
  Administered 2021-07-15: 80 mg via INTRAVENOUS
  Administered 2021-07-15 (×2): 20 mg via INTRAVENOUS

## 2021-07-15 MED ORDER — BUPIVACAINE LIPOSOME 1.3 % IJ SUSP
INTRAMUSCULAR | Status: DC | PRN
  Administered 2021-07-15: 20 mL

## 2021-07-15 MED ORDER — SODIUM CHLORIDE 0.9% FLUSH: 3.0000 mL | INTRAVENOUS | Status: DC | PRN

## 2021-07-15 MED ORDER — ONDANSETRON HCL 4 MG/2ML IJ SOLN
4.0000 mg | Freq: Four times a day (QID) | INTRAMUSCULAR | Status: DC | PRN
  Administered 2021-07-15: 4 mg via INTRAVENOUS
  Filled 2021-07-15: qty 2

## 2021-07-15 MED ORDER — MAGIC MOUTHWASH
15.0000 mL | Freq: Four times a day (QID) | ORAL | Status: DC | PRN
  Filled 2021-07-15: qty 15

## 2021-07-15 MED ORDER — TRIMETHOPRIM 100 MG PO TABS
100.0000 mg | ORAL_TABLET | Freq: Every day | ORAL | Status: DC
  Administered 2021-07-15 – 2021-07-16 (×2): 100 mg via ORAL
  Filled 2021-07-15 (×2): qty 1

## 2021-07-15 MED ORDER — ACETAMINOPHEN 500 MG PO TABS
1000.0000 mg | ORAL_TABLET | ORAL | Status: AC
  Administered 2021-07-15: 1000 mg via ORAL
  Filled 2021-07-15: qty 2

## 2021-07-15 MED ORDER — ACETAMINOPHEN 500 MG PO TABS
1000.0000 mg | ORAL_TABLET | Freq: Four times a day (QID) | ORAL | Status: DC
  Administered 2021-07-15 – 2021-07-17 (×7): 1000 mg via ORAL
  Filled 2021-07-15 (×7): qty 2

## 2021-07-15 MED ORDER — DIPHENHYDRAMINE HCL 12.5 MG/5ML PO ELIX: 12.5000 mg | ORAL_SOLUTION | Freq: Four times a day (QID) | ORAL | Status: DC | PRN

## 2021-07-15 MED ORDER — ROCURONIUM BROMIDE 10 MG/ML (PF) SYRINGE
PREFILLED_SYRINGE | INTRAVENOUS | Status: AC
  Filled 2021-07-15: qty 10

## 2021-07-15 MED ORDER — LACTATED RINGERS IV SOLN: INTRAVENOUS | Status: AC

## 2021-07-15 MED ORDER — LIDOCAINE HCL (PF) 2 % IJ SOLN
INTRAMUSCULAR | Status: DC | PRN
  Administered 2021-07-15: 1.5 mg/kg/h via INTRADERMAL

## 2021-07-15 MED ORDER — FENTANYL CITRATE (PF) 250 MCG/5ML IJ SOLN
INTRAMUSCULAR | Status: AC
  Filled 2021-07-15: qty 5

## 2021-07-15 MED ORDER — ONDANSETRON HCL 4 MG/2ML IJ SOLN
INTRAMUSCULAR | Status: DC | PRN
  Administered 2021-07-15: 4 mg via INTRAVENOUS

## 2021-07-15 MED ORDER — CHLORHEXIDINE GLUCONATE 0.12 % MT SOLN
15.0000 mL | Freq: Once | OROMUCOSAL | Status: AC
  Administered 2021-07-15: 15 mL via OROMUCOSAL

## 2021-07-15 MED ORDER — MEPERIDINE HCL 50 MG/ML IJ SOLN: 6.2500 mg | INTRAMUSCULAR | Status: DC | PRN

## 2021-07-15 MED ORDER — ALVIMOPAN 12 MG PO CAPS
12.0000 mg | ORAL_CAPSULE | ORAL | Status: AC
  Administered 2021-07-15: 12 mg via ORAL
  Filled 2021-07-15: qty 1

## 2021-07-15 MED ORDER — CLINDAMYCIN PHOSPHATE 900 MG/50ML IV SOLN
900.0000 mg | Freq: Three times a day (TID) | INTRAVENOUS | Status: AC
  Administered 2021-07-15: 900 mg via INTRAVENOUS
  Filled 2021-07-15: qty 50

## 2021-07-15 MED ORDER — LIDOCAINE 2% (20 MG/ML) 5 ML SYRINGE
INTRAMUSCULAR | Status: DC | PRN
  Administered 2021-07-15: 60 mg via INTRAVENOUS

## 2021-07-15 MED ORDER — SODIUM CHLORIDE 0.9% FLUSH
3.0000 mL | Freq: Two times a day (BID) | INTRAVENOUS | Status: DC
  Administered 2021-07-15 – 2021-07-16 (×2): 3 mL via INTRAVENOUS

## 2021-07-15 MED ORDER — KETAMINE HCL 10 MG/ML IJ SOLN
INTRAMUSCULAR | Status: AC
  Filled 2021-07-15: qty 1

## 2021-07-15 MED ORDER — SUGAMMADEX SODIUM 200 MG/2ML IV SOLN
INTRAVENOUS | Status: DC | PRN
  Administered 2021-07-15: 200 mg via INTRAVENOUS

## 2021-07-15 MED ORDER — FENTANYL CITRATE (PF) 250 MCG/5ML IJ SOLN
INTRAMUSCULAR | Status: DC | PRN
  Administered 2021-07-15 (×4): 50 ug via INTRAVENOUS
  Administered 2021-07-15 (×2): 25 ug via INTRAVENOUS

## 2021-07-15 MED ORDER — DEXAMETHASONE SODIUM PHOSPHATE 10 MG/ML IJ SOLN
INTRAMUSCULAR | Status: AC
  Filled 2021-07-15: qty 1

## 2021-07-15 MED ORDER — ENOXAPARIN SODIUM 40 MG/0.4ML IJ SOSY
40.0000 mg | PREFILLED_SYRINGE | Freq: Once | INTRAMUSCULAR | Status: AC
  Administered 2021-07-15: 40 mg via SUBCUTANEOUS
  Filled 2021-07-15: qty 0.4

## 2021-07-15 MED ORDER — ENSURE PRE-SURGERY PO LIQD
592.0000 mL | Freq: Once | ORAL | Status: DC
  Filled 2021-07-15: qty 592

## 2021-07-15 MED ORDER — TRAMADOL HCL 50 MG PO TABS
50.0000 mg | ORAL_TABLET | Freq: Four times a day (QID) | ORAL | Status: DC | PRN
  Administered 2021-07-15 – 2021-07-16 (×3): 100 mg via ORAL
  Administered 2021-07-16: 50 mg via ORAL
  Administered 2021-07-17: 100 mg via ORAL
  Filled 2021-07-15 (×5): qty 2

## 2021-07-15 MED ORDER — LABETALOL HCL 5 MG/ML IV SOLN
INTRAVENOUS | Status: DC | PRN
  Administered 2021-07-15: 5 mg via INTRAVENOUS

## 2021-07-15 MED ORDER — ALVIMOPAN 12 MG PO CAPS
12.0000 mg | ORAL_CAPSULE | Freq: Two times a day (BID) | ORAL | Status: DC
  Administered 2021-07-16: 12 mg via ORAL
  Filled 2021-07-15: qty 1

## 2021-07-15 MED ORDER — ENALAPRILAT 1.25 MG/ML IV SOLN
0.6250 mg | Freq: Four times a day (QID) | INTRAVENOUS | Status: DC | PRN
  Filled 2021-07-15: qty 1

## 2021-07-15 MED ORDER — OXYCODONE HCL 5 MG/5ML PO SOLN: 5.0000 mg | Freq: Once | ORAL | Status: DC | PRN

## 2021-07-15 MED ORDER — ZINC SULFATE 220 (50 ZN) MG PO CAPS
220.0000 mg | ORAL_CAPSULE | Freq: Every day | ORAL | Status: DC
  Administered 2021-07-16: 220 mg via ORAL
  Filled 2021-07-15 (×3): qty 1

## 2021-07-15 MED ORDER — MELATONIN 3 MG PO TABS: 3.0000 mg | ORAL_TABLET | Freq: Every evening | ORAL | Status: DC | PRN

## 2021-07-15 MED ORDER — LIP MEDEX EX OINT
1.0000 "application " | TOPICAL_OINTMENT | Freq: Two times a day (BID) | CUTANEOUS | Status: DC
  Administered 2021-07-15 – 2021-07-16 (×3): 1 via TOPICAL
  Filled 2021-07-15 (×3): qty 7

## 2021-07-15 MED ORDER — PROPOFOL 10 MG/ML IV BOLUS
INTRAVENOUS | Status: AC
  Filled 2021-07-15: qty 20

## 2021-07-15 MED ORDER — GABAPENTIN 300 MG PO CAPS
300.0000 mg | ORAL_CAPSULE | ORAL | Status: AC
  Administered 2021-07-15: 300 mg via ORAL
  Filled 2021-07-15: qty 1

## 2021-07-15 MED ORDER — PROCHLORPERAZINE MALEATE 10 MG PO TABS
10.0000 mg | ORAL_TABLET | Freq: Four times a day (QID) | ORAL | Status: DC | PRN
  Filled 2021-07-15: qty 1

## 2021-07-15 MED ORDER — HYDROMORPHONE HCL 1 MG/ML IJ SOLN
INTRAMUSCULAR | Status: AC
  Administered 2021-07-15: 0.5 mg via INTRAVENOUS
  Filled 2021-07-15: qty 2

## 2021-07-15 MED ORDER — OXYCODONE HCL 5 MG PO TABS: 5.0000 mg | ORAL_TABLET | Freq: Once | ORAL | Status: DC | PRN

## 2021-07-15 MED ORDER — MIDAZOLAM HCL 2 MG/2ML IJ SOLN
INTRAMUSCULAR | Status: AC
  Filled 2021-07-15: qty 2

## 2021-07-15 MED ORDER — TRAMADOL HCL 50 MG PO TABS
50.0000 mg | ORAL_TABLET | Freq: Four times a day (QID) | ORAL | 0 refills | Status: DC | PRN
  Filled 2021-07-15: qty 20, 3d supply, fill #0

## 2021-07-15 MED ORDER — PHENYLEPHRINE HCL-NACL 20-0.9 MG/250ML-% IV SOLN
INTRAVENOUS | Status: DC | PRN
Start: 2021-07-15 — End: 2021-07-15
  Administered 2021-07-15: 40 ug/min via INTRAVENOUS

## 2021-07-15 MED ORDER — GENTAMICIN SULFATE 40 MG/ML IJ SOLN
5.0000 mg/kg | INTRAVENOUS | Status: AC
  Administered 2021-07-15: 374 mg via INTRAVENOUS
  Filled 2021-07-15: qty 9.25

## 2021-07-15 MED ORDER — CELECOXIB 200 MG PO CAPS
200.0000 mg | ORAL_CAPSULE | ORAL | Status: AC
  Administered 2021-07-15: 200 mg via ORAL
  Filled 2021-07-15: qty 1

## 2021-07-15 MED ORDER — LACTATED RINGERS IV BOLUS: 1000.0000 mL | Freq: Three times a day (TID) | INTRAVENOUS | Status: DC | PRN

## 2021-07-15 MED ORDER — METOPROLOL TARTRATE 5 MG/5ML IV SOLN: 5.0000 mg | Freq: Four times a day (QID) | INTRAVENOUS | Status: DC | PRN

## 2021-07-15 MED ORDER — PHENYLEPHRINE 40 MCG/ML (10ML) SYRINGE FOR IV PUSH (FOR BLOOD PRESSURE SUPPORT)
PREFILLED_SYRINGE | INTRAVENOUS | Status: DC | PRN
  Administered 2021-07-15: 80 ug via INTRAVENOUS
  Administered 2021-07-15: 120 ug via INTRAVENOUS
  Administered 2021-07-15: 80 ug via INTRAVENOUS
  Administered 2021-07-15: 120 ug via INTRAVENOUS

## 2021-07-15 MED ORDER — ALUM & MAG HYDROXIDE-SIMETH 200-200-20 MG/5ML PO SUSP: 30.0000 mL | Freq: Four times a day (QID) | ORAL | Status: DC | PRN

## 2021-07-15 MED ORDER — HYDROMORPHONE HCL 1 MG/ML IJ SOLN
0.2500 mg | INTRAMUSCULAR | Status: DC | PRN
  Administered 2021-07-15: 0.5 mg via INTRAVENOUS
  Administered 2021-07-15: 0.25 mg via INTRAVENOUS
  Administered 2021-07-15: 0.5 mg via INTRAVENOUS

## 2021-07-15 MED ORDER — ONDANSETRON HCL 4 MG/2ML IJ SOLN
INTRAMUSCULAR | Status: AC
  Filled 2021-07-15: qty 2

## 2021-07-15 MED ORDER — SODIUM CHLORIDE 0.9 % IV SOLN: 250.0000 mL | INTRAVENOUS | Status: DC | PRN

## 2021-07-15 MED ORDER — ASPIRIN EC 81 MG PO TBEC
81.0000 mg | DELAYED_RELEASE_TABLET | Freq: Every day | ORAL | Status: DC
  Administered 2021-07-15 – 2021-07-16 (×2): 81 mg via ORAL
  Filled 2021-07-15 (×2): qty 1

## 2021-07-15 MED ORDER — ONDANSETRON HCL 4 MG PO TABS: 4.0000 mg | ORAL_TABLET | Freq: Four times a day (QID) | ORAL | Status: DC | PRN

## 2021-07-15 MED ORDER — EPHEDRINE SULFATE-NACL 50-0.9 MG/10ML-% IV SOSY
PREFILLED_SYRINGE | INTRAVENOUS | Status: DC | PRN
  Administered 2021-07-15: 10 mg via INTRAVENOUS
  Administered 2021-07-15: 5 mg via INTRAVENOUS

## 2021-07-15 MED ORDER — ENOXAPARIN SODIUM 40 MG/0.4ML IJ SOSY
40.0000 mg | PREFILLED_SYRINGE | INTRAMUSCULAR | Status: DC
  Administered 2021-07-16 – 2021-07-17 (×2): 40 mg via SUBCUTANEOUS
  Filled 2021-07-15 (×2): qty 0.4

## 2021-07-15 MED ORDER — DIPHENHYDRAMINE HCL 50 MG/ML IJ SOLN: 12.5000 mg | Freq: Four times a day (QID) | INTRAMUSCULAR | Status: DC | PRN

## 2021-07-15 MED ORDER — LACTATED RINGERS IV SOLN: INTRAVENOUS | Status: DC | PRN

## 2021-07-15 MED ORDER — NICOTINE 14 MG/24HR TD PT24: 14.0000 mg | MEDICATED_PATCH | Freq: Every day | TRANSDERMAL | Status: DC

## 2021-07-15 MED ORDER — LACTATED RINGERS IV SOLN: INTRAVENOUS | Status: DC

## 2021-07-15 MED ORDER — BUPIVACAINE-EPINEPHRINE (PF) 0.25% -1:200000 IJ SOLN
INTRAMUSCULAR | Status: DC | PRN
  Administered 2021-07-15: 60 mL

## 2021-07-15 MED ORDER — BUPIVACAINE LIPOSOME 1.3 % IJ SUSP
INTRAMUSCULAR | Status: AC
  Filled 2021-07-15: qty 20

## 2021-07-15 MED ORDER — LIDOCAINE HCL (PF) 2 % IJ SOLN
INTRAMUSCULAR | Status: AC
  Filled 2021-07-15: qty 10

## 2021-07-15 MED ORDER — LACTATED RINGERS IR SOLN
Status: DC | PRN
  Administered 2021-07-15: 2000 mL

## 2021-07-15 MED ORDER — PROPOFOL 10 MG/ML IV BOLUS
INTRAVENOUS | Status: DC | PRN
Start: 2021-07-15 — End: 2021-07-15
  Administered 2021-07-15: 150 mg via INTRAVENOUS

## 2021-07-15 MED ORDER — BUPIVACAINE LIPOSOME 1.3 % IJ SUSP: 20.0000 mL | Freq: Once | INTRAMUSCULAR | Status: DC

## 2021-07-15 MED ORDER — ENSURE SURGERY PO LIQD
237.0000 mL | Freq: Two times a day (BID) | ORAL | Status: DC
  Administered 2021-07-16: 237 mL via ORAL

## 2021-07-15 MED ORDER — ZINC GLUCONATE 50 MG PO TABS: 50.0000 mg | ORAL_TABLET | Freq: Every day | ORAL | Status: DC

## 2021-07-15 MED ORDER — BUPIVACAINE-EPINEPHRINE (PF) 0.25% -1:200000 IJ SOLN
INTRAMUSCULAR | Status: AC
  Filled 2021-07-15: qty 60

## 2021-07-15 MED ORDER — PROMETHAZINE HCL 25 MG/ML IJ SOLN: 6.2500 mg | INTRAMUSCULAR | Status: DC | PRN

## 2021-07-15 MED ORDER — HYDRALAZINE HCL 20 MG/ML IJ SOLN: 10.0000 mg | INTRAMUSCULAR | Status: DC | PRN

## 2021-07-15 MED ORDER — MELATONIN 5 MG PO TABS
10.0000 mg | ORAL_TABLET | Freq: Every day | ORAL | Status: DC
  Administered 2021-07-15 – 2021-07-16 (×2): 10 mg via ORAL
  Filled 2021-07-15 (×2): qty 2

## 2021-07-15 MED ORDER — ORAL CARE MOUTH RINSE: 15.0000 mL | Freq: Once | OROMUCOSAL | Status: AC

## 2021-07-15 MED ORDER — VALACYCLOVIR HCL 500 MG PO TABS
500.0000 mg | ORAL_TABLET | Freq: Every day | ORAL | Status: DC
  Administered 2021-07-15 – 2021-07-16 (×2): 500 mg via ORAL
  Filled 2021-07-15 (×2): qty 1

## 2021-07-15 MED ORDER — AMISULPRIDE (ANTIEMETIC) 5 MG/2ML IV SOLN: 10.0000 mg | Freq: Once | INTRAVENOUS | Status: DC | PRN

## 2021-07-15 MED ORDER — SIMETHICONE 80 MG PO CHEW: 40.0000 mg | CHEWABLE_TABLET | Freq: Four times a day (QID) | ORAL | Status: DC | PRN

## 2021-07-15 MED ORDER — PHENYLEPHRINE HCL-NACL 20-0.9 MG/250ML-% IV SOLN
INTRAVENOUS | Status: AC
  Filled 2021-07-15: qty 500

## 2021-07-15 MED ORDER — ENSURE PRE-SURGERY PO LIQD
296.0000 mL | Freq: Once | ORAL | Status: DC
  Filled 2021-07-15: qty 296

## 2021-07-15 MED ORDER — CLINDAMYCIN PHOSPHATE 900 MG/50ML IV SOLN
900.0000 mg | INTRAVENOUS | Status: AC
  Administered 2021-07-15: 900 mg via INTRAVENOUS
  Filled 2021-07-15: qty 50

## 2021-07-15 MED ORDER — DEXAMETHASONE SODIUM PHOSPHATE 10 MG/ML IJ SOLN
INTRAMUSCULAR | Status: DC | PRN
  Administered 2021-07-15: 6 mg via INTRAVENOUS

## 2021-07-15 MED ORDER — 0.9 % SODIUM CHLORIDE (POUR BTL) OPTIME
TOPICAL | Status: DC | PRN
Start: 2021-07-15 — End: 2021-07-15
  Administered 2021-07-15: 1000 mL

## 2021-07-15 SURGICAL SUPPLY — 111 items
APPLIER CLIP 5 13 M/L LIGAMAX5 (MISCELLANEOUS)
APPLIER CLIP ROT 10 11.4 M/L (STAPLE)
BAG COUNTER SPONGE SURGICOUNT (BAG) ×3 IMPLANT
BLADE EXTENDED COATED 6.5IN (ELECTRODE) IMPLANT
CANNULA REDUC XI 12-8 STAPL (CANNULA)
CANNULA REDUCER 12-8 DVNC XI (CANNULA) IMPLANT
CELLS DAT CNTRL 66122 CELL SVR (MISCELLANEOUS) IMPLANT
CHLORAPREP W/TINT 26 (MISCELLANEOUS) IMPLANT
CLIP APPLIE 5 13 M/L LIGAMAX5 (MISCELLANEOUS) IMPLANT
CLIP APPLIE ROT 10 11.4 M/L (STAPLE) IMPLANT
COVER SURGICAL LIGHT HANDLE (MISCELLANEOUS) ×6 IMPLANT
COVER TIP SHEARS 8 DVNC (MISCELLANEOUS) ×2 IMPLANT
COVER TIP SHEARS 8MM DA VINCI (MISCELLANEOUS) ×1
DEVICE TROCAR PUNCTURE CLOSURE (ENDOMECHANICALS) IMPLANT
DRAIN CHANNEL 19F RND (DRAIN) IMPLANT
DRAPE ARM DVNC X/XI (DISPOSABLE) ×8 IMPLANT
DRAPE COLUMN DVNC XI (DISPOSABLE) ×2 IMPLANT
DRAPE DA VINCI XI ARM (DISPOSABLE) ×4
DRAPE DA VINCI XI COLUMN (DISPOSABLE) ×1
DRAPE SURG IRRIG POUCH 19X23 (DRAPES) ×3 IMPLANT
DRSG OPSITE POSTOP 4X10 (GAUZE/BANDAGES/DRESSINGS) IMPLANT
DRSG OPSITE POSTOP 4X6 (GAUZE/BANDAGES/DRESSINGS) IMPLANT
DRSG OPSITE POSTOP 4X8 (GAUZE/BANDAGES/DRESSINGS) ×1 IMPLANT
DRSG TEGADERM 2-3/8X2-3/4 SM (GAUZE/BANDAGES/DRESSINGS) ×11 IMPLANT
DRSG TEGADERM 4X4.75 (GAUZE/BANDAGES/DRESSINGS) IMPLANT
ELECT PENCIL ROCKER SW 15FT (MISCELLANEOUS) ×3 IMPLANT
ELECT REM PT RETURN 15FT ADLT (MISCELLANEOUS) ×3 IMPLANT
ENDOLOOP SUT PDS II  0 18 (SUTURE)
ENDOLOOP SUT PDS II 0 18 (SUTURE) IMPLANT
EVACUATOR SILICONE 100CC (DRAIN) IMPLANT
GAUZE SPONGE 2X2 8PLY STRL LF (GAUZE/BANDAGES/DRESSINGS) ×2 IMPLANT
GLOVE SURG NEOPR MICRO LF SZ8 (GLOVE) ×9 IMPLANT
GLOVE SURG UNDER LTX SZ8 (GLOVE) ×9 IMPLANT
GOWN STRL REUS W/TWL XL LVL3 (GOWN DISPOSABLE) ×9 IMPLANT
GRASPER SUT TROCAR 14GX15 (MISCELLANEOUS) IMPLANT
HOLDER FOLEY CATH W/STRAP (MISCELLANEOUS) ×3 IMPLANT
IRRIG SUCT STRYKERFLOW 2 WTIP (MISCELLANEOUS) ×3
IRRIGATION SUCT STRKRFLW 2 WTP (MISCELLANEOUS) ×2 IMPLANT
KIT PROCEDURE DA VINCI SI (MISCELLANEOUS)
KIT PROCEDURE DVNC SI (MISCELLANEOUS) IMPLANT
KIT SIGMOIDOSCOPE (SET/KITS/TRAYS/PACK) IMPLANT
KIT TURNOVER KIT A (KITS) IMPLANT
NDL INSUFFLATION 14GA 120MM (NEEDLE) ×2 IMPLANT
NEEDLE INSUFFLATION 14GA 120MM (NEEDLE) ×3 IMPLANT
PACK CARDIOVASCULAR III (CUSTOM PROCEDURE TRAY) ×3 IMPLANT
PACK COLON (CUSTOM PROCEDURE TRAY) ×3 IMPLANT
PAD POSITIONING PINK XL (MISCELLANEOUS) ×3 IMPLANT
PROTECTOR NERVE ULNAR (MISCELLANEOUS) ×6 IMPLANT
RELOAD STAPLE 45 3.5 BLU DVNC (STAPLE) IMPLANT
RELOAD STAPLE 45 4.3 GRN DVNC (STAPLE) IMPLANT
RELOAD STAPLE 60 3.5 BLU DVNC (STAPLE) IMPLANT
RELOAD STAPLE 60 4.3 GRN DVNC (STAPLE) IMPLANT
RELOAD STAPLER 3.5X45 BLU DVNC (STAPLE) IMPLANT
RELOAD STAPLER 3.5X60 BLU DVNC (STAPLE) IMPLANT
RELOAD STAPLER 4.3X45 GRN DVNC (STAPLE) IMPLANT
RELOAD STAPLER 4.3X60 GRN DVNC (STAPLE) ×2 IMPLANT
RETRACTOR WND ALEXIS 18 MED (MISCELLANEOUS) IMPLANT
RTRCTR WOUND ALEXIS 18CM MED (MISCELLANEOUS)
SCISSORS LAP 5X35 DISP (ENDOMECHANICALS) ×3 IMPLANT
SEAL CANN UNIV 5-8 DVNC XI (MISCELLANEOUS) ×6 IMPLANT
SEAL XI 5MM-8MM UNIVERSAL (MISCELLANEOUS) ×3
SEALER VESSEL DA VINCI XI (MISCELLANEOUS) ×1
SEALER VESSEL EXT DVNC XI (MISCELLANEOUS) ×2 IMPLANT
SOLUTION ELECTROLUBE (MISCELLANEOUS) ×3 IMPLANT
SPIKE FLUID TRANSFER (MISCELLANEOUS) ×3 IMPLANT
SPONGE GAUZE 2X2 STER 10/PKG (GAUZE/BANDAGES/DRESSINGS) ×1
STAPLER 45 DA VINCI SURE FORM (STAPLE)
STAPLER 45 SUREFORM DVNC (STAPLE) IMPLANT
STAPLER 60 DA VINCI SURE FORM (STAPLE) ×1
STAPLER 60 SUREFORM DVNC (STAPLE) IMPLANT
STAPLER CANNULA SEAL DVNC XI (STAPLE) ×2 IMPLANT
STAPLER CANNULA SEAL XI (STAPLE) ×1
STAPLER ECHELON POWER CIR 29 (STAPLE) IMPLANT
STAPLER ECHELON POWER CIR 31 (STAPLE) ×1 IMPLANT
STAPLER RELOAD 3.5X45 BLU DVNC (STAPLE)
STAPLER RELOAD 3.5X45 BLUE (STAPLE)
STAPLER RELOAD 3.5X60 BLU DVNC (STAPLE)
STAPLER RELOAD 3.5X60 BLUE (STAPLE)
STAPLER RELOAD 4.3X45 GREEN (STAPLE)
STAPLER RELOAD 4.3X45 GRN DVNC (STAPLE)
STAPLER RELOAD 4.3X60 GREEN (STAPLE) ×1
STAPLER RELOAD 4.3X60 GRN DVNC (STAPLE) ×2
STOPCOCK 4 WAY LG BORE MALE ST (IV SETS) ×6 IMPLANT
SURGILUBE 2OZ TUBE FLIPTOP (MISCELLANEOUS) IMPLANT
SUT MNCRL AB 4-0 PS2 18 (SUTURE) ×3 IMPLANT
SUT PDS AB 1 CT1 27 (SUTURE) ×6 IMPLANT
SUT PROLENE 0 CT 2 (SUTURE) IMPLANT
SUT PROLENE 2 0 KS (SUTURE) IMPLANT
SUT PROLENE 2 0 SH DA (SUTURE) IMPLANT
SUT SILK 2 0 (SUTURE)
SUT SILK 2 0 SH CR/8 (SUTURE) IMPLANT
SUT SILK 2-0 18XBRD TIE 12 (SUTURE) IMPLANT
SUT SILK 3 0 (SUTURE)
SUT SILK 3 0 SH CR/8 (SUTURE) ×3 IMPLANT
SUT SILK 3-0 18XBRD TIE 12 (SUTURE) IMPLANT
SUT V-LOC BARB 180 2/0GR6 GS22 (SUTURE)
SUT VIC AB 3-0 SH 18 (SUTURE) IMPLANT
SUT VIC AB 3-0 SH 27 (SUTURE)
SUT VIC AB 3-0 SH 27XBRD (SUTURE) IMPLANT
SUT VICRYL 0 UR6 27IN ABS (SUTURE) ×3 IMPLANT
SUTURE V-LC BRB 180 2/0GR6GS22 (SUTURE) IMPLANT
SYR 10ML ECCENTRIC (SYRINGE) ×3 IMPLANT
SYS LAPSCP GELPORT 120MM (MISCELLANEOUS)
SYS WOUND ALEXIS 18CM MED (MISCELLANEOUS) ×3
SYSTEM LAPSCP GELPORT 120MM (MISCELLANEOUS) IMPLANT
SYSTEM WOUND ALEXIS 18CM MED (MISCELLANEOUS) ×2 IMPLANT
TOWEL OR NON WOVEN STRL DISP B (DISPOSABLE) ×3 IMPLANT
TRAY FOLEY MTR SLVR 16FR STAT (SET/KITS/TRAYS/PACK) ×3 IMPLANT
TROCAR ADV FIXATION 5X100MM (TROCAR) ×3 IMPLANT
TUBING CONNECTING 10 (TUBING) ×6 IMPLANT
TUBING INSUFFLATION 10FT LAP (TUBING) ×3 IMPLANT

## 2021-07-15 NOTE — H&P (Signed)
07/15/2021   REFERRING PHYSICIAN: Plotnikov, Evie Lacks, MD  Patient Care Team: Cassandria Anger, MD as PCP - General (Internal Medicine) Milus Banister, MD (Gastroenterology)  PROVIDER: Hollace Kinnier, MD  [facility] MRN: [id number] DOB: 03/09/1961 DATE OF ENCOUNTER: 05/09/2021  SUBJECTIVE   Chief Complaint: Diverticulitis   History of Present Illness: Tamara Charles is a 61 y.o. female who is seen today  as an office consultation at the request of Dr. Alain Marion  for evaluation of Diverticulitis .   Former smoker. History of spontaneous blebs in pneumothoraxes. No major issues in the past decade. Has had episodes of left lower quadrant abdominal pain with at least 1 CAT scan confirming diverticulitis. Usually managed with oral antibiotic regimens. However she has issues with most antibiotics now. Saw gastroenterology. Dr. Ardis Hughs in 2020. Had discussion about diet adjustment and recommended screening colonoscopy. As far as I can tell that did not happen. She recalls having an underwhelming colonoscopy when she lived in [place] about 15 years ago. Records not available.  She had another episode of diverticulitis this year. Discussed with her primary care physician. He recommended considering surgical consultation. Patient is a history of UTIs as well. She is never needed to be hospitalized from. However she has had a lot of issues with oral antibiotics. Augmentin seems to work but gives her diarrhea. Had a rash on cefdinir/Flagyl. Cannot tolerate Bactrim nor Cipro.  Patient here with her niece. She recalls having attacks for many years. I see CAT scans from 2019, 2021, 2022 for diverticulitis. She smokes less than a pack a day. Has quit for up to a 3 years but is back on it. She walks an hour with her niece many times a week without issues. No exertional chest pain or shortness of breath. 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 No issues since.  No sleep apnea. No diabetes. She had a hysterectomy in the distant past but nothing recently. She is just hoping to getting back to eating pecan pie. She is try to avoid nuts and seeds even though she was reassured by her gastrologist it was safe to consume. Does not recall any specific food triggering her episodes/attacks.  Medical History:  Past Medical History:  Diagnosis Date   Hypertension   Patient Active Problem List  Diagnosis   Diverticulitis of sigmoid colon   Tobacco abuse   Past Surgical History:  Procedure Laterality Date   HYSTERECTOMY   lung surgery N/A    Allergies  Allergen Reactions   Amoxicillin-Pot Clavulanate Other (See Comments)  Some diarrhea. No rash. Able to tolerate   Ciprofloxacin Unknown  Sun rash   Sulfamethoxazole-Trimethoprim Nausea   Cefdinir Rash  Was taking with flagyl, developed rash   Metronidazole Rash  Nausea - needs anti-nausea Rx w/it  Took with cefdinir and developed rash   Current Outpatient Medications on File Prior to Visit  Medication Sig Dispense Refill   cholecalciferol (VITAMIN D3) 2,000 unit capsule Take by mouth   nortriptyline (PAMELOR) 10 MG capsule Take 1 capsule (10 mg total) by mouth at bedtime   ondansetron (ZOFRAN-ODT) 4 MG disintegrating tablet Take 1 tablet (4 mg total) by mouth every 8 (eight) hours as needed   valACYclovir (VALTREX) 500 MG tablet Take 1 tablet (500 mg total) by mouth at bedtime   calcium carbonate (TUMS) 200 mg calcium (500 mg) chewable tablet Take by mouth   cefdinir (OMNICEF) 300 mg capsule Take 1 capsule (300 mg  total) by mouth 2 (two) times daily   meloxicam (MOBIC) 15 MG tablet   metroNIDAZOLE (FLAGYL) 500 MG tablet Take 1 tablet (500 mg total) by mouth 2 (two) times daily   naproxen sodium (ALEVE) 220 MG tablet Take by mouth   psyllium, aspartame, (METAMUCIL) 3.4 gram packet Take by mouth   trimethoprim 100 mg tablet   No current facility-administered medications on file prior to visit.    History reviewed. No pertinent family history.   Social History   Tobacco Use  Smoking Status Never  Smokeless Tobacco Never    Social History   Socioeconomic History   Marital status: Married  Tobacco Use   Smoking status: Never   Smokeless tobacco: Never  Vaping Use   Vaping Use: Never used  Substance and Sexual Activity   Alcohol use: Yes   Drug use: Never   ############################################################  Review of Systems: A complete review of systems (ROS) was obtained from the patient. I have reviewed this information and discussed as appropriate with the patient. See HPI as well for other pertinent ROS.  Constitutional: No fevers, chills, sweats. Weight stable Eyes: No vision changes, No discharge HENT: No sore throats, nasal drainage Lymph: No neck swelling, No bruising easily Pulmonary: No cough, productive sputum CV: No orthopnea, PND Patient walks 60 minutes for about 3 miles without difficulty. No exertional chest/neck/shoulder/arm pain.  GI: No personal nor family history of GI/colon cancer, inflammatory bowel disease, irritable bowel syndrome, allergy such as Celiac Sprue, dietary/dairy problems, colitis, ulcers nor gastritis. No recent sick contacts/gastroenteritis. No travel outside the country. No changes in diet.  Renal: No UTIs, No hematuria Genital: No drainage, bleeding, masses Musculoskeletal: No severe joint pain. Good ROM major joints Skin: No sores or lesions Heme/Lymph: No easy bleeding. No swollen lymph nodes  OBJECTIVE   Vitals:  05/09/21 1526  BP: (!) 138/90  Pulse: 84  Temp: 36.9 C (98.4 F)  SpO2: 96%  Weight: 75.9 kg (167 lb 6.4 oz)  Height: 170.2 cm (5\' 7" )    Body mass index is 26.22 kg/m.  PHYSICAL EXAM:  Constitutional: Not cachectic. Hygeine adequate. Vitals signs as above.  Eyes: Pupils reactive, normal extraocular movements. Sclera nonicteric Neuro: CN II-XII intact. No major focal sensory defects.  No major motor deficits. Lymph: No head/neck/groin lymphadenopathy Psych: No severe agitation. No severe anxiety. Judgment & insight Adequate, Oriented x4, HENT: Normocephalic, Mucus membranes moist. No thrush.  Neck: Supple, No tracheal deviation. No obvious thyromegaly Chest: No pain to chest wall compression. Good respiratory excursion. No audible wheezing CV: Pulses intact. Regular rhythm. No major extremity edema Ext: No obvious deformity or contracture. Edema: Not present. No cyanosis Skin: No major subcutaneous nodules. Warm and dry Musculoskeletal: Severe joint rigidity not present. No obvious clubbing. No digital petechiae.   Abdomen:  Flat Hernia: Not present. Diastasis recti: Not present. Soft. Nondistended. Nontender. Some mild soreness along left lower quadrant/flank. No guarding. No peritonitis no hepatomegaly. No splenomegaly  Genital/Pelvic: Inguinal hernia: Not present. Inguinal lymph nodes: without lymphadenopathy.   Rectal: (Deferred)    ###################################################################  Labs, Imaging and Diagnostic Testing:  Located in 'Care Everywhere' section of Epic EMR chart  PRIOR [facility] CLINIC NOTES:  Not applicable  SURGERY NOTES:  Not applicable  PATHOLOGY:  Not applicable  Assessment and Plan:  DIAGNOSES:  Diagnoses and all orders for this visit:  Diverticulitis of sigmoid colon  Tobacco abuse  Recurrent UTI (urinary tract infection)    ASSESSMENT/PLAN  Pleasant smoking female with recurrent  episodes of left lower quadrant pain and CT scan showing episodes of diverticulitis. One seem to be at the descending/sigmoid junction. Another one to my view seems to be more in the mid sigmoid. No evidence of any fistula to bladder or vagina even though recurrent UTIs.  Think she has had repeated enough attacks to consider elective segmental resection. She is interested in the idea since she is only in her 38s and she is having  attacks every year. 2 this year.  The anatomy & physiology of the digestive tract was discussed. The pathophysiology of the colon was discussed. Natural history risks without surgery was discussed. I feel the risks of no intervention will lead to serious problems that outweigh the operative risks; therefore, I recommended a partial colectomy to remove the pathology. Minimally invasive (Robotic/Laparoscopic) & open techniques were discussed.   Risks such as bleeding, infection, abscess, leak, reoperation, injury to other organs, need for repair of tissues / organs, possible ostomy, hernia, heart attack, stroke, death, and other risks were discussed. I noted a good likelihood this will help address the problem. Goals of post-operative recovery were discussed as well. Need for adequate nutrition, daily bowel regimen and healthy physical activity, to optimize recovery was noted as well. We will work to minimize complications. Educational materials were available as well. Questions were answered. The patient expresses understanding & wishes to proceed with surgery.  She is overdue for colonoscopy. Had a been recommended in 2020. See if we can coordinate this to get it done the day before surgery, that way the patient just gets 1 bowel prep. We will try and get this done in February when she returns from her travels. She is not having any severe symptoms right now so probably can wait an extra month  I strong recommend she quit smoking. STOP SMOKING! We talked to the patient about the dangers of smoking. We stressed that tobacco use dramatically increases the risk of peri-operative complications such as infection, tissue necrosis leaving to problems with incision/wound and organ healing, hernia, chronic pain, heart attack, stroke, DVT, pulmonary embolism, and death. We noted there are programs in our community to help stop smoking. Information was available.    Adin Hector, MD, FACS, MASCRS Esophageal,  Gastrointestinal & Colorectal Surgery Robotic and Minimally Invasive Surgery  [facility], [place]  [place]. [address] [place], [place] [address] [phone] Fax [phone] Main  CONTACT INFORMATION:  Weekday (9AM-5PM): Call [facility] main office at [phone]  Weeknight (5PM-9AM) or Weekend/Holiday: Check [email or web address] (password " [email or web address]") for General Surgery [facility] coverage  (Please, do not use SecureChat as it is not reliable communication to operating surgeons for immediate patient care)    07/15/2021

## 2021-07-15 NOTE — Addendum Note (Signed)
Addendum  created 07/15/21 1231 by Milford Cage, CRNA   Intraprocedure Meds edited

## 2021-07-15 NOTE — Interval H&P Note (Signed)
History and Physical Interval Note:  07/15/2021 7:20 AM  Tamara Charles  has presented today for surgery, with the diagnosis of DIVERTICULITIS.  The various methods of treatment have been discussed with the patient and family. After consideration of risks, benefits and other options for treatment, the patient has consented to  Procedure(s): ROBOTOIC RESECTION OF COLON, DESCENDING AND SIGMOID (N/A) RIGID PROCTOSCOPY (N/A) as a surgical intervention.  The patient's history has been reviewed, patient examined, no change in status, stable for surgery.  I have reviewed the patient's chart and labs.  Questions were answered to the patient's satisfaction.    I have re-reviewed the the patient's records, history, medications, and allergies.  I have re-examined the patient.  I again discussed intraoperative plans and goals of post-operative recovery.  The patient agrees to proceed.  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   Left flank pain 06/26/2018   Acute bronchitis 06/26/2018   Lower abdominal pain 06/26/2018   Diverticulitis 01/28/2018   Diarrhea 01/28/2018   Rash and nonspecific skin eruption 10/23/2017   Hot flash not due to menopause 06/26/2016   Herpes simplex 04/20/2015   Weight gain 08/25/2014   Well adult exam 02/23/2014   Bleb, lung ([place]) 02/23/2014   Family history of early CAD 02/23/2014   Tobacco use disorder 02/23/2014    Hypertension 04/21/2013   Spontaneous pneumothorax 04/21/2013    Past Medical History:  Diagnosis Date   Arthritis    GERD (gastroesophageal reflux disease)    Hypertension    Spontaneous pneumothorax    at 61 yo   UTI (urinary tract infection)     Past Surgical History:  Procedure Laterality Date   ABDOMINAL HYSTERECTOMY     partial   BREAST SURGERY Bilateral    breast reduction   LUNG SURGERY Bilateral 1997   REDUCTION MAMMAPLASTY     TUBAL LIGATION      Social History   Socioeconomic History   Marital status: Married    Spouse name: Not on file   Number of children: Not on file   Years of education: Not on file   Highest education level: Not on file  Occupational History   Not on file  Tobacco Use   Smoking status: Every Day    Packs/day: 0.50    Years: 15.00    Pack years: 7.50    Types: Cigarettes   Smokeless tobacco: Never  Vaping Use   Vaping Use: Never used  Substance and Sexual Activity   Alcohol use: Yes    Comment: 3 times per week   Drug use: Not Currently    Types: Marijuana    Comment: none in several years   Sexual activity: Yes  Other Topics Concern   Not on file  Social History Narrative   Not on file   Social Determinants of Health   Financial Resource Strain: Not on file  Food Insecurity: Not on file  Transportation Needs: Not on file  Physical Activity: Not on file  Stress: Not on file  Social Connections: Not on file  Intimate Partner Violence: Not on file    Family History  Problem Relation Age of Onset   Heart disease Mother    Hypertension Mother    Heart disease Brother    Bladder Cancer Maternal Grandmother    Breast cancer Maternal Aunt    Colon cancer Other        grandmothers sister colon cancer   Stomach cancer Neg Hx    Pancreatic cancer Neg Hx     Medications Prior to Admission  Medication Sig Dispense Refill Last Dose   aspirin EC 81 MG tablet Take 1 tablet (81 mg total) by mouth daily. 100 tablet 3  07/13/2021   b complex vitamins tablet Take 1 tablet by mouth daily. 100 tablet 3 Past Week   calcium carbonate (TUMS - DOSED IN MG ELEMENTAL CALCIUM) 500 MG chewable tablet Chew 1 tablet by mouth 3 (three) times daily as needed for indigestion or heartburn.   Past Week   Cholecalciferol (VITAMIN D3) 50 MCG (2000 UT) capsule Take 1 capsule (2,000 Units total) by mouth daily. 100 capsule 3 Past Week   Melatonin 10 MG TABS Take 10 mg by mouth at bedtime.   Past Week   meloxicam (MOBIC) 15 MG tablet TAKE 1 TABLET BY MOUTH EVERY DAY 30 tablet 2 Past Month   naproxen sodium (ANAPROX) 220 MG tablet Take 220 mg by mouth 2 (two) times daily as needed (pain).   Past Week   Probiotic Product (ALIGN) 4 MG CAPS Take 1 capsule (4 mg total) by mouth daily. (Patient taking differently: Take 1 capsule by mouth at bedtime.) 30 capsule 1 Past Week   triamterene-hydrochlorothiazide (MAXZIDE-25) 37.5-25 MG tablet TAKE 1 TABLET BY MOUTH EVERY DAY 90 tablet 3 07/14/2021   trimethoprim (TRIMPEX) 100 MG tablet Take 100 mg by mouth at bedtime.   07/12/2021   valACYclovir (VALTREX) 500 MG tablet TAKE 1 TABLET BY MOUTH AT BEDTIME. 90 tablet 3 Past Week   vitamin B-12 (CYANOCOBALAMIN) 1000 MCG tablet Take 2,000 mcg by mouth daily.   Past Week   zinc gluconate 50 MG tablet Take 50 mg by mouth daily.   Past Week    Current Facility-Administered Medications  Medication Dose Route Frequency Provider Last Rate Last Admin   bupivacaine liposome (EXPAREL) 1.3 % injection 266 mg  20 mL Infiltration Once Michael Boston, MD       clindamycin (CLEOCIN) IVPB 900 mg  900 mg Intravenous 60 min Pre-Op Michael Boston, MD       And   gentamicin (GARAMYCIN) 370 mg in dextrose 5 % 100 mL IVPB  5 mg/kg Intravenous 60 min Pre-Op Michael Boston, MD       [START ON 07/16/2021] feeding supplement (ENSURE PRE-SURGERY) liquid 296 mL  296 mL Oral Once Michael Boston, MD       feeding supplement (ENSURE PRE-SURGERY) liquid 592 mL  592 mL Oral Once Michael Boston, MD       lactated ringers infusion   Intravenous Continuous Roderic Palau, MD 10 mL/hr at 07/15/21 0630 Continued from Pre-op at 07/15/21 0630   phenylephrine (NEOSYNEPHRINE) 20-0.9 MG/250ML-% infusion              Allergies  Allergen Reactions   Augmentin [Amoxicillin-Pot Clavulanate] Diarrhea     Some diarrhea. No rash. Able to tolerate   Bactrim [Sulfamethoxazole-Trimethoprim] Nausea Only   Cefdinir Rash    Was taking with flagyl, developed rash  Ciprofloxacin Rash    Sun rash   Flagyl [Metronidazole] Nausea Only and Rash    Nausea - needs anti-nausea Rx w/it  Took with cefdinir and developed rash    BP 125/84    Pulse 75    Temp 98.3 F (36.8 C) (Oral)    Resp 18    Ht 5\' 6"  (1.676 m)    Wt 75.5 kg    LMP 06/05/2008    SpO2 97%    BMI 26.87 kg/m   Labs: Results for orders placed or performed during the hospital encounter of 07/14/21 (from the past 48 hour(s))  SARS CORONAVIRUS 2 (TAT 6-24 HRS) Nasopharyngeal Nasopharyngeal Swab     Status: None   Collection Time: 07/13/21  7:57 AM   Specimen: Nasopharyngeal Swab  Result Value Ref Range   SARS Coronavirus 2 NEGATIVE NEGATIVE    Comment: (NOTE) SARS-CoV-2 target nucleic acids are NOT DETECTED.  The SARS-CoV-2 RNA is generally detectable in upper and lower respiratory specimens during the acute phase of infection. Negative results do not preclude SARS-CoV-2 infection, do not rule out co-infections with other pathogens, and should not be used as the sole basis for treatment or other patient management decisions. Negative results must be combined with clinical observations, patient history, and epidemiological information. The expected result is Negative.  Fact Sheet for Patients: SugarRoll.be  Fact Sheet for Healthcare Providers: https://www.woods-mathews.com/  This test is not yet approved or cleared by the [place] FDA and  has been authorized for  detection and/or diagnosis of SARS-CoV-2 by FDA under an Emergency Use Authorization (EUA). This EUA will remain  in effect (meaning this test can be used) for the duration of the COVID-19 declaration under Se ction 564(b)(1) of the Act, 21 U.S.C. section 360bbb-3(b)(1), unless the authorization is terminated or revoked sooner.  Performed at [facility], [place] [address] [place], [place] [address]     Imaging / Studies: No results found.   Adin Hector, M.D., F.A.C.S. Gastrointestinal and Minimally Invasive Surgery [facility] [address], [place], [place] [address] [phone] Main / Paging  07/15/2021 7:20 AM    Adin Hector

## 2021-07-15 NOTE — Discharge Instructions (Signed)
SURGERY: POST OP INSTRUCTIONS (Surgery for small bowel obstruction, colon resection, etc)   ######################################################################  EAT Gradually transition to a high fiber diet with a fiber supplement over the next few days after discharge  WALK Walk an hour a day.  Control your pain to do that.    CONTROL PAIN Control pain so that you can walk, sleep, tolerate sneezing/coughing, go up/down stairs.  HAVE A BOWEL MOVEMENT DAILY Keep your bowels regular to avoid problems.  OK to try a laxative to override constipation.  OK to use an antidairrheal to slow down diarrhea.  Call if not better after 2 tries  CALL IF YOU HAVE PROBLEMS/CONCERNS Call if you are still struggling despite following these instructions. Call if you have concerns not answered by these instructions  ######################################################################   DIET Follow a light diet the first few days at home.  Start with a bland diet such as soups, liquids, starchy foods, low fat foods, etc.  If you feel full, bloated, or constipated, stay on a ful liquid or pureed/blenderized diet for a few days until you feel better and no longer constipated. Be sure to drink plenty of fluids every day to avoid getting dehydrated (feeling dizzy, not urinating, etc.). Gradually add a fiber supplement to your diet over the next week.  Gradually get back to a regular solid diet.  Avoid fast food or heavy meals the first week as you are more likely to get nauseated. It is expected for your digestive tract to need a few months to get back to normal.  It is common for your bowel movements and stools to be irregular.  You will have occasional bloating and cramping that should eventually fade away.  Until you are eating solid food normally, off all pain medications, and back to regular activities; your bowels will not be normal. Focus on eating a low-fat, high fiber diet the rest of your life  (See Getting to [place], below).  CARE of your INCISION or WOUND It is good for closed incision and even open wounds to be washed every day.  Shower every day.  Short baths are fine.  Wash the incisions and wounds clean with soap & water.     If you have a closed incision(s), wash the incision with soap & water every day.  You may leave closed incisions open to air if it is dry.   You may cover the incision with clean gauze & replace it after your daily shower for comfort.  It is good for closed incisions and even open wounds to be washed every day.  Shower every day.  Short baths are fine.  Wash the incisions and wounds clean with soap & water.    You may leave closed incisions open to air if it is dry.   You may cover the incision with clean gauze & replace it after your daily shower for comfort.  TEGADERM:  You have clear gauze band-aid dressings over your closed incision(s).  Remove the dressings 3 days after surgery.   If you have an open wound with a wound vac, see wound vac care instructions.     ACTIVITIES as tolerated Start light daily activities --- self-care, walking, climbing stairs-- beginning the day after surgery.  Gradually increase activities as tolerated.  Control your pain to be active.  Stop when you are tired.  Ideally, walk several times a day, eventually an hour a day.   Most people are back to most day-to-day activities in  a few weeks.  It takes 4-8 weeks to get back to unrestricted, intense activity. If you can walk 30 minutes without difficulty, it is safe to try more intense activity such as jogging, treadmill, bicycling, low-impact aerobics, swimming, etc. Save the most intensive and strenuous activity for last (Usually 4-8 weeks after surgery) such as sit-ups, heavy lifting, contact sports, etc.  Refrain from any intense heavy lifting or straining until you are off narcotics for pain control.  You will have off days, but things should improve  week-by-week. DO NOT PUSH THROUGH PAIN.  Let pain be your guide: If it hurts to do something, don't do it.  Pain is your body warning you to avoid that activity for another week until the pain goes down. You may drive when you are no longer taking narcotic prescription pain medication, you can comfortably wear a seatbelt, and you can safely make sudden turns/stops to protect yourself without hesitating due to pain. You may have sexual intercourse when it is comfortable. If it hurts to do something, stop.  MEDICATIONS Take your usually prescribed home medications unless otherwise directed.   Blood thinners:  Usually you can restart any strong blood thinners after the second postoperative day.  It is OK to take aspirin right away.     If you are on strong blood thinners (warfarin/Coumadin, Plavix, Xerelto, Eliquis, Pradaxa, etc), discuss with your surgeon, medicine PCP, and/or cardiologist for instructions on when to restart the blood thinner & if blood monitoring is needed (PT/INR blood check, etc).     PAIN CONTROL Pain after surgery or related to activity is often due to strain/injury to muscle, tendon, nerves and/or incisions.  This pain is usually short-term and will improve in a few months.  To help speed the process of healing and to get back to regular activity more quickly, DO THE FOLLOWING THINGS TOGETHER: Increase activity gradually.  DO NOT PUSH THROUGH PAIN Use Ice and/or Heat Try Gentle Massage and/or Stretching Take over the counter pain medication Take Narcotic prescription pain medication for more severe pain  Good pain control = faster recovery.  It is better to take more medicine to be more active than to stay in bed all day to avoid medications.  Increase activity gradually Avoid heavy lifting at first, then increase to lifting as tolerated over the next 6 weeks. Do not push through the pain.  Listen to your body and avoid positions and maneuvers than reproduce the pain.   Wait a few days before trying something more intense Walking an hour a day is encouraged to help your body recover faster and more safely.  Start slowly and stop when getting sore.  If you can walk 30 minutes without stopping or pain, you can try more intense activity (running, jogging, aerobics, cycling, swimming, treadmill, sex, sports, weightlifting, etc.) Remember: If it hurts to do it, then dont do it! Use Ice and/or Heat You will have swelling and bruising around the incisions.  This will take several weeks to resolve. Ice packs or heating pads (6-8 times a day, 30-60 minutes at a time) will help sooth soreness & bruising. Some people prefer to use ice alone, heat alone, or alternate between ice & heat.  Experiment and see what works best for you.  Consider trying ice for the first few days to help decrease swelling and bruising; then, switch to heat to help relax sore spots and speed recovery. Shower every day.  Short baths are fine.  It feels good!  Keep the incisions and wounds clean with soap & water.   Try Gentle Massage and/or Stretching Massage at the area of pain many times a day Stop if you feel pain - do not overdo it Take over the counter pain medication This helps the muscle and nerve tissues become less irritable and calm down faster Choose ONE of the following over-the-counter anti-inflammatory medications: Acetaminophen 500mg  tabs (Tylenol) 1-2 pills with every meal and just before bedtime (avoid if you have liver problems or if you have acetaminophen in you narcotic prescription) Naproxen 220mg  tabs (ex. Aleve, Naprosyn) 1-2 pills twice a day (avoid if you have kidney, stomach, IBD, or bleeding problems) Ibuprofen 200mg  tabs (ex. Advil, Motrin) 3-4 pills with every meal and just before bedtime (avoid if you have kidney, stomach, IBD, or bleeding problems) Take with food/snack several times a day as directed for at least 2 weeks to help keep pain / soreness down & more  manageable. Take Narcotic prescription pain medication for more severe pain A prescription for strong pain control is often given to you upon discharge (for example: oxycodone/Percocet, hydrocodone/Norco/Vicodin, or tramadol/Ultram) Take your pain medication as prescribed. Be mindful that most narcotic prescriptions contain Tylenol (acetaminophen) as well - avoid taking too much Tylenol. If you are having problems/concerns with the prescription medicine (does not control pain, nausea, vomiting, rash, itching, etc.), please call us [phone] to see if we need to switch you to a different pain medicine that will work better for you and/or control your side effects better. If you need a refill on your pain medication, you must call the office before 4 pm and on weekdays only.  By federal law, prescriptions for narcotics cannot be called into a pharmacy.  They must be filled out on paper & picked up from our office by the patient or authorized caretaker.  Prescriptions cannot be filled after 4 pm nor on weekends.    WHEN TO CALL us [phone] Severe uncontrolled or worsening pain  Fever over 101 F (38.5 C) Concerns with the incision: Worsening pain, redness, rash/hives, swelling, bleeding, or drainage Reactions / problems with new medications (itching, rash, hives, nausea, etc.) Nausea and/or vomiting Difficulty urinating Difficulty breathing Worsening fatigue, dizziness, lightheadedness, blurred vision Other concerns If you are not getting better after two weeks or are noticing you are getting worse, contact our office [phone] for further advice.  We may need to adjust your medications, re-evaluate you in the office, send you to the emergency room, or see what other things we can do to help. The clinic staff is available to answer your questions during regular business hours (8:30am-5pm).  Please dont hesitate to call and ask to speak to one of our nurses for clinical concerns.    A  surgeon from [facility] is always on call at the hospitals 24 hours/day If you have a medical emergency, go to the nearest emergency room or call 911.  FOLLOW UP in our office One the day of your discharge from the hospital (or the next business weekday), please call [facility] to set up or confirm an appointment to see your surgeon in the office for a follow-up appointment.  Usually it is 2-3 weeks after your surgery.   If you have skin staples at your incision(s), let the office know so we can set up a time in the office for the nurse to remove them (usually around 10 days after surgery). Make sure that you call for  appointments the day of discharge (or the next business weekday) from the hospital to ensure a convenient appointment time. IF YOU HAVE DISABILITY OR FAMILY LEAVE FORMS, BRING THEM TO THE OFFICE FOR PROCESSING.  DO NOT GIVE THEM TO YOUR DOCTOR.  [facility] [address], [place], [place], [place]  [address] ? [phone] - Main [phone] - [place],  [phone] - Fax [email or web address]  GETTING TO GOOD BOWEL HEALTH. It is expected for your digestive tract to need a few months to get back to normal.  It is common for your bowel movements and stools to be irregular.  You will have occasional bloating and cramping that should eventually fade away.  Until you are eating solid food normally, off all pain medications, and back to regular activities; your bowels will not be normal.   Avoiding constipation The goal: ONE SOFT BOWEL MOVEMENT A DAY!    Drink plenty of fluids.  Choose water first. TAKE A FIBER SUPPLEMENT EVERY DAY THE REST OF YOUR LIFE During your first week back home, gradually add back a fiber supplement every day Experiment which form you can tolerate.   There are many forms such as powders, tablets, wafers, gummies, etc Psyllium bran (Metamucil), methylcellulose (Citrucel), Miralax or Glycolax,  Benefiber, Flax Seed.  Adjust the dose week-by-week (1/2 dose/day to 6 doses a day) until you are moving your bowels 1-2 times a day.  Cut back the dose or try a different fiber product if it is giving you problems such as diarrhea or bloating. Sometimes a laxative is needed to help jump-start bowels if constipated until the fiber supplement can help regulate your bowels.  If you are tolerating eating & you are farting, it is okay to try a gentle laxative such as double dose MiraLax, prune juice, or Milk of Magnesia.  Avoid using laxatives too often. Stool softeners can sometimes help counteract the constipating effects of narcotic pain medicines.  It can also cause diarrhea, so avoid using for too long. If you are still constipated despite taking fiber daily, eating solids, and a few doses of laxatives, call our office. Controlling diarrhea Try drinking liquids and eating bland foods for a few days to avoid stressing your intestines further. Avoid dairy products (especially milk & ice cream) for a short time.  The intestines often can lose the ability to digest lactose when stressed. Avoid foods that cause gassiness or bloating.  Typical foods include beans and other legumes, cabbage, broccoli, and dairy foods.  Avoid greasy, spicy, fast foods.  Every person has some sensitivity to other foods, so listen to your body and avoid those foods that trigger problems for you. Probiotics (such as active yogurt, Align, etc) may help repopulate the intestines and colon with normal bacteria and calm down a sensitive digestive tract Adding a fiber supplement gradually can help thicken stools by absorbing excess fluid and retrain the intestines to act more normally.  Slowly increase the dose over a few weeks.  Too much fiber too soon can backfire and cause cramping & bloating. It is okay to try and slow down diarrhea with a few doses of antidiarrheal medicines.   Bismuth subsalicylate (ex. Kayopectate, Pepto Bismol)  for a few doses can help control diarrhea.  Avoid if pregnant.   Loperamide (Imodium) can slow down diarrhea.  Start with one tablet (2mg ) first.  Avoid if you are having fevers or severe pain.  ILEOSTOMY PATIENTS WILL HAVE CHRONIC DIARRHEA since their colon is  not in use.    Drink plenty of liquids.  You will need to drink even more glasses of water/liquid a day to avoid getting dehydrated. Record output from your ileostomy.  Expect to empty the bag every 3-4 hours at first.  Most people with a permanent ileostomy empty their bag 4-6 times at the least.   Use antidiarrheal medicine (especially Imodium) several times a day to avoid getting dehydrated.  Start with a dose at bedtime & breakfast.  Adjust up or down as needed.  Increase antidiarrheal medications as directed to avoid emptying the bag more than 8 times a day (every 3 hours). Work with your wound ostomy nurse to learn care for your ostomy.  See ostomy care instructions. TROUBLESHOOTING IRREGULAR BOWELS 1) Start with a soft & bland diet. No spicy, greasy, or fried foods.  2) Avoid gluten/wheat or dairy products from diet to see if symptoms improve. 3) Miralax 17gm or flax seed mixed in [place]. water or juice-daily. May use 2-4 times a day as needed. 4) Gas-X, Phazyme, etc. as needed for gas & bloating.  5) Prilosec (omeprazole) over-the-counter as needed 6)  Consider probiotics (Align, Activa, etc) to help calm the bowels down  Call your doctor if you are getting worse or not getting better.  Sometimes further testing (cultures, endoscopy, X-ray studies, CT scans, bloodwork, etc.) may be needed to help diagnose and treat the cause of the diarrhea. [facility], [place], [place], [place], [place]  [address] [phone] - Main.    [phone]  - Toll Free.   [phone] - Fax [email or web address]

## 2021-07-15 NOTE — Anesthesia Preprocedure Evaluation (Signed)
Anesthesia Evaluation  Patient identified by MRN, date of birth, ID band Patient awake    Reviewed: Allergy & Precautions, NPO status , Patient's Chart, lab work & pertinent test results  History of Anesthesia Complications Negative for: history of anesthetic complications  Airway Mallampati: II  TM Distance: >3 FB Neck ROM: Full    Dental  (+) Missing,    Pulmonary Current Smoker and Patient abstained from smoking.,    Pulmonary exam normal        Cardiovascular hypertension, Pt. on medications Normal cardiovascular exam     Neuro/Psych negative neurological ROS  negative psych ROS   GI/Hepatic Neg liver ROS, GERD  Medicated and Controlled,recurrent diverticulitis   Endo/Other  negative endocrine ROS  Renal/GU negative Renal ROS  negative genitourinary   Musculoskeletal  (+) Arthritis , Osteoarthritis,    Abdominal   Peds  Hematology negative hematology ROS (+)   Anesthesia Other Findings Day of surgery medications reviewed with patient.  Reproductive/Obstetrics negative OB ROS                             Anesthesia Physical  Anesthesia Plan  ASA: 2  Anesthesia Plan: General   Post-op Pain Management:    Induction: Intravenous  PONV Risk Score and Plan: 2 and Treatment may vary due to age or medical condition, Ondansetron and Midazolam  Airway Management Planned: Oral ETT  Additional Equipment: None  Intra-op Plan:   Post-operative Plan: Extubation in OR  Informed Consent: I have reviewed the patients History and Physical, chart, labs and discussed the procedure including the risks, benefits and alternatives for the proposed anesthesia with the patient or authorized representative who has indicated his/her understanding and acceptance.       Plan Discussed with: CRNA  Anesthesia Plan Comments:         Anesthesia Quick Evaluation

## 2021-07-15 NOTE — Transfer of Care (Signed)
Immediate Anesthesia Transfer of Care Note  Patient: Tamara Charles  Procedure(s) Performed: ROBOTOIC LOW ANTERIOR RESECTION, BILATERAL TAP BLOCK (Abdomen) RIGID PROCTOSCOPY (Rectum)  Patient Location: PACU  Anesthesia Type:General  Level of Consciousness: awake  Airway & Oxygen Therapy: Patient Spontanous Breathing and Patient connected to face mask oxygen  Post-op Assessment: Report given to RN and Post -op Vital signs reviewed and stable  Post vital signs: Reviewed and stable  Last Vitals:  Vitals Value Taken Time  BP 147/77 07/15/21 1015  Temp 36.7 C 07/15/21 1011  Pulse 58 07/15/21 1018  Resp 16 07/15/21 1018  SpO2 100 % 07/15/21 1018  Vitals shown include unvalidated device data.  Last Pain:  Vitals:   07/15/21 1011  TempSrc:   PainSc: Asleep      Patients Stated Pain Goal: 3 (91/22/58 3462)  Complications: No notable events documented.

## 2021-07-15 NOTE — Anesthesia Postprocedure Evaluation (Signed)
Anesthesia Post Note  Patient: Tameah Mihalko  Procedure(s) Performed: ROBOTOIC LOW ANTERIOR RESECTION, BILATERAL TAP BLOCK (Abdomen) RIGID PROCTOSCOPY (Rectum)     Patient location during evaluation: PACU Anesthesia Type: General Level of consciousness: awake and alert Pain management: pain level controlled Vital Signs Assessment: post-procedure vital signs reviewed and stable Respiratory status: spontaneous breathing, nonlabored ventilation and respiratory function stable Cardiovascular status: blood pressure returned to baseline and stable Postop Assessment: no apparent nausea or vomiting Anesthetic complications: no   No notable events documented.  Last Vitals:  Vitals:   07/15/21 1056 07/15/21 1111  BP: 105/65 102/61  Pulse: 64 61  Resp: 17 18  Temp:  (!) 36.4 C  SpO2: 97% 98%    Last Pain:  Vitals:   07/15/21 1056  TempSrc:   PainSc: [place]

## 2021-07-15 NOTE — Op Note (Signed)
07/15/2021  10:03 AM  PATIENT:  Tamara Charles  61 y.o. female  Patient Care Team: Plotnikov, Evie Lacks, MD as PCP - General (Internal Medicine) Milus Banister, MD as Attending Physician (Gastroenterology) Michael Boston, MD as Consulting Physician (General Surgery)  PRE-OPERATIVE DIAGNOSIS:  DIVERTICULITIS  POST-OPERATIVE DIAGNOSIS:  RECURRENT DESCENDING/SIGMOID DIVERTICULITIS  PROCEDURE:   ROBOTIC LOW ANTERIOR RECTOSIGMOID RESECTION TRANSVERSUS ABDOMINIS PLANE (TAP) BLOCK - BILATERAL RIGID PROCTOSCOPY  SURGEON:  Adin Hector, MD  ASSISTANT: Leighton Ruff, MD, FACS, FASCRS An experienced assistant was required given the standard of surgical care given the complexity of the case.  This assistant was needed for exposure, dissection, suction, tissue approximation, retraction, perception, etc.  ANESTHESIA:     General  Regional TRANSVERSUS ABDOMINIS PLANE (TAP) nerve block for perioperative & postoperative pain control provided with liposomal bupivacaine (Experel) mixed with 0.25% bupivacaine as a Bilateral TAP block x 62mL each side at the level of the transverse abdominis & preperitoneal spaces along the flank at the anterior axillary line, from subcostal ridge to iliac crest under laparoscopic guidance   Local field block at port sites & extraction wound  EBL:  Total I/O In: 1110.4 [I.V.:1000; IV Piggyback:110.4] Out: 150 [Urine:50; Blood:100]  Delay start of Pharmacological VTE agent (>24hrs) due to surgical blood loss or risk of bleeding:  no  DRAINS: No  SPECIMEN:   RECTOSIGMOID COLON (open end proximal) DISTAL ANASTOMOTIC RING (final distal margin)  DISPOSITION OF SPECIMEN:  PATHOLOGY  COUNTS:  YES  PLAN OF CARE: Admit to inpatient   PATIENT DISPOSITION:  PACU - hemodynamically stable.  INDICATION:    Pleasant woman with repeated bouts of left lower quadrant pain and diverticulitis in the descending/sigmoid region.  Frequent enough attacks that she is  having problems with tolerating oral antibiotic regimens.  Given the numerous attacks and frequent recurrences, I recommended segmental resection:  The anatomy & physiology of the digestive tract was discussed.  The pathophysiology was discussed.  Natural history risks without surgery was discussed.   I worked to give an overview of the disease and the frequent need to have multispecialty involvement.  I feel the risks of no intervention will lead to serious problems that outweigh the operative risks; therefore, I recommended a partial colectomy to remove the pathology.  Laparoscopic & open techniques were discussed.   Risks such as bleeding, infection, abscess, leak, reoperation, possible ostomy, hernia, heart attack, death, and other risks were discussed.  I noted a good likelihood this will help address the problem.   Goals of post-operative recovery were discussed as well.  We will work to minimize complications.  Educational materials on the pathology had been given in the office.  Questions were answered.    Patient had a colonoscopy yesterday by Dr. Ardis Hughs which confirmed diverticulosis and chronic diverticulitis/thickening inflammation on the more distal left side.  Corresponded with CAT scan and symptoms.  The patient expressed understanding & wished to proceed with surgery.  OR FINDINGS:   Patient had redundant sigmoid colon twisted upon itself with some distal descending and sigmoid diverticulitis.  No obvious metastatic disease on visceral parietal peritoneum or liver.  The anastomosis rests 10 cm from the anal verge by rigid proctoscopy.  CASE DATA:  Type of patient?: Elective [facility] Private Case  Status of Case? Elective Scheduled  Infection Present At Time Of Surgery (PATOS)?  NO  DESCRIPTION:   Informed consent was confirmed.  The patient underwent general anaesthesia without difficulty.  The patient was positioned appropriately.  VTE prevention in place.  The patient was  clipped, prepped, & draped in a sterile fashion.  Surgical timeout confirmed our plan.  The patient was positioned in reverse Trendelenburg.  Abdominal entry was gained using Varess technique at the left subcostal ridge on the anterior abdominal wall.  No elevated EtCO2 noted.  Port placed.  Camera inspection revealed no injury.  Extra ports were carefully placed under direct laparoscopic visualization.  Upon entering the abdomen (organ space), I encountered some adhesions the lower pelvis.  I freed those off sharply..   I reflected the greater omentum and the upper abdomen the small bowel in the upper abdomen.  Confirmed thickened adhesions of distal descending colon and sigmoid colon twisted upon itself down in the pelvis.  The patient was carefully positioned.  The Intuitive daVinci robot was docked with camera & instruments carefully placed.  I focused on freeing adhesions of the descending and sigmoid colon off the anterior abdominal wall and anterior pelvic wall.  Did a little lateral to medial dissection to help untwist the corkscrew like descending/sigmoid colon and straightened out.  With this I could elevate the descending/sigmoid colon better.  I mobilized the rectosigmoid colon & elevated it to put the main pedicle on tension.  I scored the base of peritoneum of the medial side of the mesentery of the elevated left colon from the ligament of Treitz to the mid rectum.   I elevated the sigmoid mesentery and entered into the retro-mesenteric plane. We were able to identify the left ureter and gonadal vessels. We kept those posterior within the retroperitoneum and elevated the left colon mesentery off that. I did isolate the inferior mesenteric artery (IMA) pedicle but did not ligate it yet.  I continued distally and got into the avascular plane posterior to the mesorectum, sparing the nervi ergentes.. This allowed me to help mobilize the rectum as well by freeing the mesorectum off the sacrum.  I  stayed away from the right and left ureters.  I kept the lateral vascular pedicles to the rectum intact.  Did have to free up the proximal rectum as it was stuck and inflamed.  Eventually straighten the region out.  I returned attention to the left retroperitoneum and left colon.  I continued medial to lateral dissection to free the left colon mesentery off the retroperitoneum going up towards the splenic flexure to allow good mobility and protect the colon mesentery.  I mobilized the left colon in a lateral to medial fashion off the retroperitoneum and sidewall attachments along the line of Toldt up towards the splenic flexure to ensure good mobilization of the remaining left colon to reach into the pelvis.   We then focused on mesorectal dissection.  Freed the mesorectum off the presacral plane until I was distal to the concerning region.  Plan on having to go a little more at the proximal/mid rectal junction that was not inflamed without any prior adhesions.  Freed off peritoneum on the lateral sidewalls as well and transected the mesentery of the lateral pedicles to get distal to the area of concern.  Came around anteriorly such that I had good circumferential mesorectal excision and a good margin distal to the area of concern.  I chose a region at the mid/distal descending colon junction that was soft and easily reached down to the rectal stump.  I transected the mesentery of the colon radially to preserve remaining colon blood supply.  I skeletonized the mesorectum and transected at the proximal rectum  using a robotic stapler.  We then chose a region for the proximal margin that would reach well for our planned anastomosis.  Transected the colon mesentery radially to preserve good collateral and marginal artery blood supply.  I was able to keep the main IMA pedicle and compress superior hemorrhoidal pedicle and preserved a dominant left colon branch since the colon mesentery was quite stretched out and  redundant.  We created an extraction incision through a small Pfannenstiel incision in the suprapubic region.  Placed a wound protector.  I was able to eviscerate the rectosigmoid and descending colon out the wound.   I clamped the colon proximal to this area using a reusable pursestringer device.  Passed a 2-0 Keith needle. I transected at the descending/sigmoid junction with a scalpel. I got healthy bleeding mucosa.  We sent the rectosigmoid colon specimen off to go to pathology.  We sized the colon orifice.  I chose a 58mm EEA anvil stapler system.  I reinforced the prolene pursestring with interrupted silk "belt loop" sutures.  I placed the anvil to the open end of the proximal remaining colon and closed around it using the pursestring.    We did copious irrigation with crystalloid solution.  Hemostasis was good.  The distal end of the remaining colon easily reached down to the rectal stump, therefore, splenic flexure mobilization was not needed.      Dr Marcello Moores scrubbed down and did gentle anal dilation and advanced the EEA stapler up the rectal stump. The spike was brought out at the provimal end of the rectal stump under direct visualization.  I  attached the anvil of the proximal colon the spike of the stapler. Anvil was tightened down and held clamped for 60 seconds.  Orientation was confirmed such that there is no twisting of the colon nor small bowel underneath the mesenteric defect. No concerning tension.  The EEA stapler was fired and held clamped for 30 seconds. The stapler was released & removed. Blue stitch is in the proximal ring.  Care was taken to ensure no other structures were incorporated within this either.  We noted 2 excellent anastomotic rings.   The colon proximal to the anastomosis was then gently occluded. The pelvis was filled with sterile irrigation.  Dr Marcello Moores  did rigid proctoscopy noted the anastomosis was at 10 cm from the anal verge consistent with the proximal rectum.  There  was a negative air leak test. There was no tension of mesentery or bowel at the anastomosis.   Tissues looked viable.  Ureters & bowel uninjured.  The anastomosis looked healthy. Greater omentum positioned down into the pelvis to help protect the anastomosis.  Endoluminal gas was evacuated.  Ports & wound protector removed.  We changed gloves & redraped the patient per colon SSI prevention protocol.  We aspirated the antibiotic irrigation.  Hemostasis was good.  Sterile unused instruments were used from this point.  I closed the skin at the port sites using Monocryl stitch and sterile dressing.  I closed the extraction wound using a 0 Vicryl vertical peritoneal closure and a #1 PDS transverse anterior rectal fascial closure like a small Pfannenstiel closure. I closed the skin with some interrupted Monocryl stitches. I placed sterile dressings.     Patient is being extubated go to recovery room. I had discussed postop care with the patient in detail the office & in the holding area. Instructions are written. I discussed operative findings, updated the patient's status, discussed probable steps to recovery,  and gave postoperative recommendations to the patient's spouse, Davina Howlett.  Recommendations were made.  Questions were answered.  He expressed understanding & appreciation.  Adin Hector, M.D., F.A.C.S. Gastrointestinal and Minimally Invasive Surgery [facility] [address], [place], [place] [address] [phone] Main / Paging

## 2021-07-15 NOTE — Progress Notes (Signed)
Transition of Care [facility]) Screening Note  Patient Details  Name: Tamara Charles Date of Birth: 05/18/1961  [facility]) CM/SW Contact:    Sherie Don, LCSW Phone Number: 07/15/2021, 2:15 PM  Transition of Care Department [facility]) has reviewed patient and no TOC needs have been identified at this time. We will continue to monitor patient advancement through interdisciplinary progression rounds. If new patient transition needs arise, please place a TOC consult.

## 2021-07-15 NOTE — Anesthesia Procedure Notes (Signed)
Procedure Name: Intubation Date/Time: 07/15/2021 7:44 AM Performed by: West Pugh, CRNA Pre-anesthesia Checklist: Patient identified, Emergency Drugs available, Suction available, Patient being monitored and Timeout performed Patient Re-evaluated:Patient Re-evaluated prior to induction Oxygen Delivery Method: Circle system utilized Preoxygenation: Pre-oxygenation with 100% oxygen Induction Type: IV induction Ventilation: Mask ventilation without difficulty Laryngoscope Size: Mac and 3 Grade View: Grade I Tube type: Oral Tube size: 7.0 mm Number of attempts: 1 Airway Equipment and Method: Stylet Placement Confirmation: ETT inserted through vocal cords under direct vision, positive ETCO2, CO2 detector and breath sounds checked- equal and bilateral Secured at: 21 cm Tube secured with: Tape Dental Injury: Teeth and Oropharynx as per pre-operative assessment

## 2021-07-16 ENCOUNTER — Encounter (HOSPITAL_COMMUNITY): Payer: Self-pay | Admitting: Surgery

## 2021-07-16 ENCOUNTER — Other Ambulatory Visit (HOSPITAL_COMMUNITY): Payer: Self-pay

## 2021-07-16 LAB — MAGNESIUM: Magnesium: 1.5 mg/dL — ABNORMAL LOW (ref 1.7–2.4)

## 2021-07-16 LAB — CBC
HCT: 30.1 % — ABNORMAL LOW (ref 36.0–46.0)
Hemoglobin: 10 g/dL — ABNORMAL LOW (ref 12.0–15.0)
MCH: 33.4 pg (ref 26.0–34.0)
MCHC: 33.2 g/dL (ref 30.0–36.0)
MCV: 100.7 fL — ABNORMAL HIGH (ref 80.0–100.0)
Platelets: 224 10*3/uL (ref 150–400)
RBC: 2.99 MIL/uL — ABNORMAL LOW (ref 3.87–5.11)
RDW: 12.6 % (ref 11.5–15.5)
WBC: 7.5 10*3/uL (ref 4.0–10.5)
nRBC: 0 % (ref 0.0–0.2)

## 2021-07-16 LAB — BASIC METABOLIC PANEL
Anion gap: 7 (ref 5–15)
BUN: 14 mg/dL (ref 6–20)
CO2: 27 mmol/L (ref 22–32)
Calcium: 8.6 mg/dL — ABNORMAL LOW (ref 8.9–10.3)
Chloride: 102 mmol/L (ref 98–111)
Creatinine, Ser: 0.66 mg/dL (ref 0.44–1.00)
GFR, Estimated: >60 mL/min (ref >60)
Glucose, Bld: 105 mg/dL — ABNORMAL HIGH (ref 70–99)
Potassium: 3.4 mmol/L — ABNORMAL LOW (ref 3.5–5.1)
Sodium: 136 mmol/L (ref 135–145)

## 2021-07-16 MED ORDER — MAGNESIUM SULFATE 2 GM/50ML IV SOLN
2.0000 g | Freq: Once | INTRAVENOUS | Status: AC
  Administered 2021-07-16: 2 g via INTRAVENOUS
  Filled 2021-07-16: qty 50

## 2021-07-16 NOTE — Progress Notes (Addendum)
1 Day Post-Op   Subjective/Chief Complaint: Had some nausea and threw up several times yesterday evening, but feels much better today.  Has had a runny stool, but no blood.  Pain control adequate.  Denies any severe pain this AM. Has already voided post foley removal.    Objective: Vital signs in last 24 hours: Temp:  [97.5 F (36.4 C)-98.4 F (36.9 C)] 97.8 F (36.6 C) (02/11 0610) Pulse Rate:  [57-74] 65 (02/11 0610) Resp:  [12-18] 18 (02/11 0610) BP: (94-147)/(49-77) 103/54 (02/11 0610) SpO2:  [86 %-100 %] 94 % (02/11 0610) Weight:  [82.2 kg] 82.2 kg (02/11 0610) Last BM Date: 07/15/21  Intake/Output from previous day: 02/10 0701 - 02/11 0700 In: 3102.9 [P.O.:360; I.V.:1882.5; IV Piggyback:860.4] Out: 900 [Urine:600; Emesis/NG output:200; Blood:100] Intake/Output this shift: No intake/output data recorded.  General appearance: alert, cooperative, and no distress Resp: breathing comfortably GI: soft, non distended, dressings on port sites and pfannenstiel extraction port c/d/I.  Extremities: extremities normal, atraumatic, no cyanosis or edema  Lab Results:  Recent Labs    07/16/21 0422  WBC 7.5  HGB 10.0*  HCT 30.1*  PLT 224   BMET Recent Labs    07/16/21 0422  NA 136  K 3.4*  CL 102  CO2 27  GLUCOSE 105*  BUN 14  CREATININE 0.66  CALCIUM 8.6*   PT/INR No results for input(s): LABPROT, INR in the last 72 hours. ABG No results for input(s): PHART, HCO3 in the last 72 hours.  Invalid input(s): PCO2, PO2  Studies/Results: No results found.  Anti-infectives: Anti-infectives (From admission, onward)    Start     Dose/Rate Route Frequency Ordered Stop   07/15/21 2200  trimethoprim (TRIMPEX) tablet 100 mg        100 mg Oral Daily at bedtime 07/15/21 1110     07/15/21 2200  valACYclovir (VALTREX) tablet 500 mg        500 mg Oral Daily at bedtime 07/15/21 1110     07/15/21 1600  clindamycin (CLEOCIN) IVPB 900 mg        900 mg 100 mL/hr over 30  Minutes Intravenous Every 8 hours 07/15/21 1110 07/15/21 1742   07/15/21 0522  clindamycin (CLEOCIN) IVPB 900 mg       See Hyperspace for full Linked Orders Report.   900 mg 100 mL/hr over 30 Minutes Intravenous 60 min pre-op 07/15/21 0522 07/15/21 0815   07/15/21 0522  gentamicin (GARAMYCIN) 370 mg in dextrose 5 % 100 mL IVPB       See Hyperspace for full Linked Orders Report.   5 mg/kg  74.8 kg 109.3 mL/hr over 60 Minutes Intravenous 60 min pre-op 07/15/21 0522 07/15/21 1018       Assessment/Plan: s/p Procedure(s): ROBOTIC LOW ANTERIOR RESECTION, BILATERAL TAP BLOCK (N/A) RIGID PROCTOSCOPY (N/A) 07/15/21 Dr. Johney Maine  d/c foley Advance diet. If passing gas and continues doing well, anticipate d/c tomorrow Standing tylenol, prn tramadol and prn dilaudid Nicoderm patch for tobacco abuse Lovenox for VTE ppx.  Home BP meds and prn enalaprilat for HTN- controlled. Replete mag for hypomagnesemia      LOS: 1 day    Stark Klein 07/16/2021

## 2021-07-17 NOTE — Progress Notes (Signed)
Nurse reviewed discharge instructions with pt.  Pt verbalized understanding of discharge instructions, follow up appointments and new medications.  No concerns at time of discharge. Pt walked off unit at discharge.

## 2021-07-17 NOTE — Discharge Summary (Signed)
Physician Discharge Summary  Patient ID: Tamara Charles MRN: [id number] DOB/AGE: 01-20-1961 61 y.o.  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   Left flank pain 06/26/2018   Acute bronchitis 06/26/2018   Lower abdominal pain 06/26/2018   Diverticulitis 01/28/2018   Diarrhea 01/28/2018   Rash and nonspecific skin eruption 10/23/2017   Hot flash not due to menopause 06/26/2016   Herpes simplex 04/20/2015   Weight gain 08/25/2014   Well adult exam 02/23/2014   Bleb, lung ([place]) 02/23/2014   Family history of early CAD 02/23/2014   Tobacco use disorder 02/23/2014   Hypertension 04/21/2013   Spontaneous pneumothorax 04/21/2013    Discharge Diagnoses:  Principal Problem:   Diverticulitis of colon And same as above  Discharged Condition: good  Hospital Course:  Patient was admitted to the floor following a robotic low anterior resection by Dr. Johney Maine July 15, 2021.  She had some nausea and vomiting on the evening of postop day 0.  This resolved quickly.  Her diet was able to be advanced over the next 36 hours.  She is passing gas and having bowel movements.  They are still a little bit loose, but are firming up.  They are nonbloody.  She is ambulatory and urinating spontaneously.  Her pain control is adequate with occasional as needed tramadol.  She is discharged home in good condition on postop day 2.  Consults: None  Significant Diagnostic Studies: labs: HCT 30.1 prior to d/c.    Treatments: surgery: see  above  Discharge Exam: Blood pressure 98/66, pulse 69, temperature 98.3 F (36.8 C), temperature source Oral, resp. rate 15, height 5\' 6"  (1.676 m), weight 82.2 kg, last menstrual period 06/05/2008, SpO2 94 %. General appearance: alert, cooperative, and no distress Resp: breathing comfortably GI: soft, non distended, dressings c/d/i Extremities: extremities normal, atraumatic, no cyanosis or edema  Disposition: Discharge disposition: 01-Home or Self Care       Discharge Instructions     Call MD for:   Complete by: As directed    FEVER > 101.5 F  (temperatures < 101.5 F are not significant)   Call MD for:  difficulty breathing, headache or visual disturbances   Complete by: As directed    Call MD for:  extreme fatigue   Complete by: As directed    Call MD for:  hives   Complete by: As directed    Call MD for:  persistant dizziness or light-headedness   Complete by: As directed    Call MD for:  persistant nausea and vomiting   Complete by: As directed    Call MD for:  persistant nausea and vomiting   Complete by: As directed    Call MD for:  redness, tenderness, or signs of infection (pain, swelling, redness, odor or green/yellow discharge around incision site)   Complete by: As directed    Call MD for:  redness, tenderness, or signs of infection (pain, swelling, redness, odor or green/yellow discharge around incision site)   Complete by: As directed    Call MD for:  severe uncontrolled  pain   Complete by: As directed    Call MD for:  severe uncontrolled pain   Complete by: As directed    Call MD for:  temperature >100.4   Complete by: As directed    Diet - low sodium heart healthy   Complete by: As directed    Start with a bland diet such as soups, liquids, starchy foods, low fat foods, etc. the first few days at home. Gradually advance to a solid, low-fat, high fiber diet by the end of the first week at home.   Add a fiber supplement to your diet (Metamucil, etc) If  you feel full, bloated, or constipated, stay on a full liquid or pureed/blenderized diet for a few days until you feel better and are no longer constipated.   Diet - low sodium heart healthy   Complete by: As directed    Discharge instructions   Complete by: As directed    See Discharge Instructions If you are not getting better after two weeks or are noticing you are getting worse, contact our office [phone] for further advice.  We may need to adjust your medications, re-evaluate you in the office, send you to the emergency room, or see what other things we can do to help. The clinic staff is available to answer your questions during regular business hours (8:30am-5pm).  Please don't hesitate to call and ask to speak to one of our nurses for clinical concerns.    A surgeon from [facility] is always on call at the hospitals 24 hours/day If you have a medical emergency, go to the nearest emergency room or call 911.   Discharge wound care:   Complete by: As directed    It is good for closed incisions and even open wounds to be washed every day.  Shower every day.  Short baths are fine.  Wash the incisions and wounds clean with soap & water.    You may leave closed incisions open to air if it is dry.   You may cover the incision with clean gauze & replace it after your daily shower for comfort.  TEGADERM:  You have clear gauze band-aid dressings over your closed incision(s).  Remove the dressings 3 days after surgery.   Driving Restrictions   Complete by: As directed    You may drive when: - you are no longer taking narcotic prescription pain medication - you can comfortably wear a seatbelt - you can safely make sudden turns/stops without pain.   Increase activity slowly   Complete by: As directed    Start light daily activities --- self-care, walking, climbing stairs- beginning the day after surgery.  Gradually increase activities as tolerated.  Control your pain to be  active.  Stop when you are tired.  Ideally, walk several times a day, eventually an hour a day.   Most people are back to most day-to-day activities in a few weeks.  It takes 4-6 weeks to get back to unrestricted, intense activity. If you can walk 30 minutes without difficulty, it is safe to try more intense activity such as jogging, treadmill, bicycling, low-impact aerobics, swimming, etc. Save the most intensive and strenuous activity for last (Usually 4-8 weeks after surgery) such as sit-ups, heavy lifting, contact sports, etc.  Refrain from any intense heavy lifting or straining until you are off narcotics for pain control.  You will have off days, but things should improve week-by-week. DO NOT PUSH THROUGH PAIN.  Let pain  be your guide: If it hurts to do something, don't do it.   Increase activity slowly   Complete by: As directed    Lifting restrictions   Complete by: As directed    If you can walk 30 minutes without difficulty, it is safe to try more intense activity such as jogging, treadmill, bicycling, low-impact aerobics, swimming, etc. Save the most intensive and strenuous activity for last (Usually 4-8 weeks after surgery) such as sit-ups, heavy lifting, contact sports, etc.   Refrain from any intense heavy lifting or straining until you are off narcotics for pain control.  You will have off days, but things should improve week-by-week. DO NOT PUSH THROUGH PAIN.  Let pain be your guide: If it hurts to do something, don't do it.  Pain is your body warning you to avoid that activity for another week until the pain goes down.   May shower / Bathe   Complete by: As directed    May walk up steps   Complete by: As directed    Remove dressing in 72 hours   Complete by: As directed    Make sure all dressings are removed by the third day after surgery.  Leave incisions open to air.  OK to cover incisions with gauze or bandages as desired   Sexual Activity Restrictions   Complete by: As  directed    You may have sexual intercourse when it is comfortable. If it hurts to do something, stop.      Allergies as of 07/17/2021       Reactions   Augmentin [amoxicillin-pot Clavulanate] Diarrhea    Some diarrhea. No rash. Able to tolerate   Bactrim [sulfamethoxazole-trimethoprim] Nausea Only   Cefdinir Rash   Was taking with flagyl, developed rash   Ciprofloxacin Rash   Sun rash   Flagyl [metronidazole] Nausea Only, Rash   Nausea - needs anti-nausea Rx w/it Took with cefdinir and developed rash        Medication List     TAKE these medications    Align 4 MG Caps Take 1 capsule (4 mg total) by mouth daily. What changed: when to take this   aspirin EC 81 MG tablet Take 1 tablet (81 mg total) by mouth daily.   b complex vitamins tablet Take 1 tablet by mouth daily.   calcium carbonate 500 MG chewable tablet Commonly known as: TUMS - dosed in mg elemental calcium Chew 1 tablet by mouth 3 (three) times daily as needed for indigestion or heartburn.   Melatonin 10 MG Tabs Take 10 mg by mouth at bedtime.   meloxicam 15 MG tablet Commonly known as: MOBIC TAKE 1 TABLET BY MOUTH EVERY DAY   naproxen sodium 220 MG tablet Commonly known as: ALEVE Take 220 mg by mouth 2 (two) times daily as needed (pain).   traMADol 50 MG tablet Commonly known as: ULTRAM Take 1 to 2 tablets by mouth every 6 hours as needed for moderate pain or severe pain.   triamterene-hydrochlorothiazide 37.5-25 MG tablet Commonly known as: MAXZIDE-25 TAKE 1 TABLET BY MOUTH EVERY DAY   trimethoprim 100 MG tablet Commonly known as: TRIMPEX Take 100 mg by mouth at bedtime.   valACYclovir 500 MG tablet Commonly known as: VALTREX TAKE 1 TABLET BY MOUTH AT BEDTIME.   vitamin B-12 1000 MCG tablet Commonly known as: CYANOCOBALAMIN Take 2,000 mcg by mouth daily.   Vitamin D3 50 MCG (2000 UT) capsule Take 1 capsule (2,000 Units total) by mouth daily.  zinc gluconate 50 MG tablet Take 50  mg by mouth daily.               Discharge Care Instructions  (From admission, onward)           Start     Ordered   07/15/21 0000  Discharge wound care:       Comments: It is good for closed incisions and even open wounds to be washed every day.  Shower every day.  Short baths are fine.  Wash the incisions and wounds clean with soap & water.    You may leave closed incisions open to air if it is dry.   You may cover the incision with clean gauze & replace it after your daily shower for comfort.  TEGADERM:  You have clear gauze band-aid dressings over your closed incision(s).  Remove the dressings 3 days after surgery.   07/15/21 0730            Follow-up Information     Michael Boston, MD Follow up in 3 week(s).   Specialties: General Surgery, Colon and Rectal Surgery Why: To follow up after your operation, To follow up after your hospital stay Contact information: [place] [address] [phone]                 Signed: Stark Klein 07/17/2021, 8:16 AM

## 2021-07-18 LAB — SURGICAL PATHOLOGY

## 2021-07-20 ENCOUNTER — Other Ambulatory Visit (HOSPITAL_COMMUNITY): Payer: Self-pay

## 2021-08-26 ENCOUNTER — Emergency Department (HOSPITAL_COMMUNITY): Payer: 59

## 2021-08-26 ENCOUNTER — Other Ambulatory Visit: Payer: Self-pay

## 2021-08-26 ENCOUNTER — Emergency Department (HOSPITAL_COMMUNITY)
Admission: EM | Admit: 2021-08-26 | Discharge: 2021-08-26 | Disposition: A | Discharge status: Home / Self Care | Payer: 59 | Attending: Emergency Medicine | Admitting: Emergency Medicine

## 2021-08-26 DIAGNOSIS — Z23 Encounter for immunization: Secondary | ICD-10-CM | POA: Insufficient documentation

## 2021-08-26 DIAGNOSIS — Y92007 Garden or yard of unspecified non-institutional (private) residence as the place of occurrence of the external cause: Secondary | ICD-10-CM | POA: Insufficient documentation

## 2021-08-26 DIAGNOSIS — S0101XA Laceration without foreign body of scalp, initial encounter: Secondary | ICD-10-CM | POA: Diagnosis not present

## 2021-08-26 DIAGNOSIS — R55 Syncope and collapse: Secondary | ICD-10-CM | POA: Insufficient documentation

## 2021-08-26 DIAGNOSIS — W01198A Fall on same level from slipping, tripping and stumbling with subsequent striking against other object, initial encounter: Secondary | ICD-10-CM | POA: Diagnosis not present

## 2021-08-26 DIAGNOSIS — I509 Heart failure, unspecified: Secondary | ICD-10-CM | POA: Insufficient documentation

## 2021-08-26 DIAGNOSIS — S0990XA Unspecified injury of head, initial encounter: Secondary | ICD-10-CM | POA: Diagnosis present

## 2021-08-26 DIAGNOSIS — Z7982 Long term (current) use of aspirin: Secondary | ICD-10-CM | POA: Insufficient documentation

## 2021-08-26 LAB — I-STAT BETA HCG BLOOD, ED (MC, WL, AP ONLY): I-stat hCG, quantitative: <5 m[IU]/mL (ref <5)

## 2021-08-26 LAB — BASIC METABOLIC PANEL
Anion gap: 5 (ref 5–15)
BUN: 18 mg/dL (ref 6–20)
CO2: 26 mmol/L (ref 22–32)
Calcium: 8.1 mg/dL — ABNORMAL LOW (ref 8.9–10.3)
Chloride: 102 mmol/L (ref 98–111)
Creatinine, Ser: 0.65 mg/dL (ref 0.44–1.00)
GFR, Estimated: >60 mL/min (ref >60)
Glucose, Bld: 111 mg/dL — ABNORMAL HIGH (ref 70–99)
Potassium: 3 mmol/L — ABNORMAL LOW (ref 3.5–5.1)
Sodium: 133 mmol/L — ABNORMAL LOW (ref 135–145)

## 2021-08-26 LAB — CBC
HCT: 33.6 % — ABNORMAL LOW (ref 36.0–46.0)
Hemoglobin: 11.5 g/dL — ABNORMAL LOW (ref 12.0–15.0)
MCH: 34.2 pg — ABNORMAL HIGH (ref 26.0–34.0)
MCHC: 34.2 g/dL (ref 30.0–36.0)
MCV: 100 fL (ref 80.0–100.0)
Platelets: 266 10*3/uL (ref 150–400)
RBC: 3.36 MIL/uL — ABNORMAL LOW (ref 3.87–5.11)
RDW: 12.7 % (ref 11.5–15.5)
WBC: 6.2 10*3/uL (ref 4.0–10.5)
nRBC: 0 % (ref 0.0–0.2)

## 2021-08-26 LAB — CBG MONITORING, ED: Glucose-Capillary: 125 mg/dL — ABNORMAL HIGH (ref 70–99)

## 2021-08-26 MED ORDER — SODIUM CHLORIDE 0.9 % IV BOLUS
1000.0000 mL | Freq: Once | INTRAVENOUS | Status: AC
  Administered 2021-08-26: 1000 mL via INTRAVENOUS

## 2021-08-26 MED ORDER — TETANUS-DIPHTH-ACELL PERTUSSIS 5-2.5-18.5 LF-MCG/0.5 IM SUSY
0.5000 mL | PREFILLED_SYRINGE | Freq: Once | INTRAMUSCULAR | Status: AC
  Administered 2021-08-26: 0.5 mL via INTRAMUSCULAR
  Filled 2021-08-26: qty 0.5

## 2021-08-26 MED ORDER — POTASSIUM CHLORIDE CRYS ER 20 MEQ PO TBCR
40.0000 meq | EXTENDED_RELEASE_TABLET | Freq: Once | ORAL | Status: AC
  Administered 2021-08-26: 40 meq via ORAL
  Filled 2021-08-26: qty 2

## 2021-08-26 NOTE — Discharge Instructions (Addendum)
Sure to drink plenty of fluids and rest. ? ?You have Dermabond which is an adhesive to the back of your hair.  Do not wash her hair over the next 2 days.  Try not to pick at this area as you may open the wound back up ? ?Follow-up with your primary care provider to let them know you are seen here today ? ?Return for new or worsening symptoms. ?

## 2021-08-26 NOTE — ED Provider Notes (Signed)
?[facility] DEPT ?Provider Note ? ? ?CSN: [id number] ?Arrival date & time: 08/26/21  0101 ? ?  ? ?History ? ?Chief Complaint  ?Patient presents with  ? Near Syncope  ? ? ?Tamara Charles is a 61 y.o. female for evaluation of syncope.  Patient states she was out in the yard today doing some yard work she also walked 4 miles which she typically did prior to having a colectomy with Dr. Johney Maine 1 month ago.  States her husband have eaten well today they were laying in bed when patient got bilateral calf cramps.  She sat up quickly to massage the back of her legs.  Subsequently decided to go to the restroom.  She walked to the restroom, bent over to massage her legs subsequently had a syncopal event.  States ?She knew she hit her head on the back of the countertop.  She denies any anticoagulation use.  States she has been doing otherwise well and feels "great" since her prior colectomy.  She is tolerating p.o. intake.  No chest pain, shortness of breath, abdominal pain, diarrhea or dysuria.  No history of PE or DVT.  She denies any prior history of syncopal episodes. No shaking, bowel or bladder incontinence, seizure like activity per husband who witnessed event. ? ?HPI ? ?  ? ?Home Medications ?Prior to Admission medications   ?Medication Sig Start Date End Date Taking? Authorizing Provider  ?aspirin EC 81 MG tablet Take 1 tablet (81 mg total) by mouth daily. 02/23/14   Plotnikov, Evie Lacks, MD  ?b complex vitamins tablet Take 1 tablet by mouth daily. 07/09/19   Plotnikov, Evie Lacks, MD  ?calcium carbonate (TUMS - DOSED IN MG ELEMENTAL CALCIUM) 500 MG chewable tablet Chew 1 tablet by mouth 3 (three) times daily as needed for indigestion or heartburn.    [provider]  ?Cholecalciferol (VITAMIN D3) 50 MCG (2000 UT) capsule Take 1 capsule (2,000 Units total) by mouth daily. 07/09/19   Plotnikov, Evie Lacks, MD  ?Melatonin 10 MG TABS Take 10 mg by mouth at bedtime.    [provider]  ?meloxicam (MOBIC) 15 MG tablet TAKE 1 TABLET BY MOUTH EVERY DAY 04/12/21   Plotnikov, Evie Lacks, MD  ?naproxen sodium (ANAPROX) 220 MG tablet Take 220 mg by mouth 2 (two) times daily as needed (pain).    [provider]  ?Probiotic Product (ALIGN) 4 MG CAPS Take 1 capsule (4 mg total) by mouth daily. ?Patient taking differently: Take 1 capsule by mouth at bedtime. 01/02/19   Plotnikov, Evie Lacks, MD  ?traMADol (ULTRAM) 50 MG tablet Take 1 to 2 tablets by mouth every 6 hours as needed for moderate pain or severe pain. 07/15/21   Michael Boston, MD  ?triamterene-hydrochlorothiazide (MAXZIDE-25) 37.5-25 MG tablet TAKE 1 TABLET BY MOUTH EVERY DAY 05/31/21   Plotnikov, Evie Lacks, MD  ?trimethoprim (TRIMPEX) 100 MG tablet Take 100 mg by mouth at bedtime. 03/15/21   [provider]  ?valACYclovir (VALTREX) 500 MG tablet TAKE 1 TABLET BY MOUTH AT BEDTIME. 04/28/21   Plotnikov, Evie Lacks, MD  ?vitamin B-12 (CYANOCOBALAMIN) 1000 MCG tablet Take 2,000 mcg by mouth daily.    [provider]  ?zinc gluconate 50 MG tablet Take 50 mg by mouth daily.    [provider]  ?   ? ?Allergies    ?Augmentin [amoxicillin-pot clavulanate], Bactrim [sulfamethoxazole-trimethoprim], Cefdinir, Ciprofloxacin, and Flagyl [metronidazole]   ? ?Review of Systems   ?Review of Systems  ?Constitutional: Negative.   ?  HENT: Negative.    ?Respiratory: Negative.    ?Cardiovascular: Negative.   ?Gastrointestinal: Negative.   ?Genitourinary: Negative.   ?Musculoskeletal: Negative.   ?Skin: Negative.   ?Neurological:  Positive for syncope. Negative for dizziness, tremors, seizures, facial asymmetry, speech difficulty, weakness, light-headedness, numbness and headaches.  ?All other systems reviewed and are negative. ? ?Physical Exam ?Updated Vital Signs ?BP 112/69   Pulse 66   Temp (!) 97.4 ?F (36.3 ?C) (Oral)   Resp 14   Ht '5\' 6"'$  (1.676 m)   Wt 81.6 kg   LMP 06/05/2008   SpO2 97%   BMI 29.05 kg/m?  ?Physical  Exam ?Vitals and nursing note reviewed.  ?Constitutional:   ?   General: She is not in acute distress. ?   Appearance: She is well-developed. She is not ill-appearing, toxic-appearing or diaphoretic.  ?HENT:  ?   Head: Normocephalic.  ?   Jaw: There is normal jaw occlusion.  ? ?   Comments: 1 cm laceration, non-bleeding.  Nontender facial bones, no drooling, dysphagia or trismus ?   Nose: Nose normal.  ?   Mouth/Throat:  ?   Lips: Pink.  ?   Mouth: Mucous membranes are moist.  ?   Pharynx: Oropharynx is clear. Uvula midline.  ?   Comments: Tongue midline, no loose dentition ?Eyes:  ?   Pupils: Pupils are equal, round, and reactive to light.  ?Neck:  ?   Trachea: Trachea and phonation normal.  ?   Comments: No midline cervical tenderness, full range of motion without difficulty ?Cardiovascular:  ?   Rate and Rhythm: Normal rate.  ?   Pulses: Normal pulses.     ?     Radial pulses are 2+ on the right side and 2+ on the left side.  ?     Dorsalis pedis pulses are 2+ on the right side and 2+ on the left side.  ?   Heart sounds: Normal heart sounds.  ?Pulmonary:  ?   Effort: Pulmonary effort is normal. No respiratory distress.  ?   Breath sounds: Normal breath sounds and air entry.  ?   Comments: Clear bilateral, speaks in full sentences without difficulty ?Chest:  ?   Comments: Nontender chest wall ?Abdominal:  ?   General: Bowel sounds are normal. There is no distension.  ?   Palpations: Abdomen is soft.  ?   Tenderness: There is no abdominal tenderness. There is no right CVA tenderness, left CVA tenderness, guarding or rebound.  ?   Comments: Well-healing laparoscopic incision scars without surrounding erythema, warmth, hernia.  Abdomen soft, nontender  ?Musculoskeletal:     ?   General: No swelling, tenderness, deformity or signs of injury. Normal range of motion.  ?   Cervical back: Full passive range of motion without pain, normal range of motion and neck supple. No signs of trauma, rigidity or crepitus. No pain  with movement or spinous process tenderness. Normal range of motion.  ?   Right lower leg: No edema.  ?   Left lower leg: No edema.  ?   Comments: No bony tenderness, compartments soft, full range of motion without difficulty.  No shortening or rotation of legs.  ?Skin: ?   General: Skin is warm and dry.  ?   Comments: 1 cm laceration posterior scalp without active bleeding  ?Neurological:  ?   General: No focal deficit present.  ?   Mental Status: She is alert.  ?   Cranial Nerves:  Cranial nerves 2-12 are intact.  ?   Sensory: Sensation is intact.  ?   Motor: Motor function is intact.  ?   Coordination: Coordination is intact.  ?   Gait: Gait is intact.  ?   Comments: CN 2-12 grossly intact ?Intact sensation to extremities ?Equal strength ?  ?Psychiatric:     ?   Mood and Affect: Mood normal.  ? ? ?ED Results / Procedures / Treatments   ?Labs ?(all labs ordered are listed, but only abnormal results are displayed) ?Labs Reviewed  ?BASIC METABOLIC PANEL - Abnormal; Notable for the following components:  ?    Result Value  ? Sodium 133 (*)   ? Potassium 3.0 (*)   ? Glucose, Bld 111 (*)   ? Calcium 8.1 (*)   ? All other components within normal limits  ?CBC - Abnormal; Notable for the following components:  ? RBC 3.36 (*)   ? Hemoglobin 11.5 (*)   ? HCT 33.6 (*)   ? MCH 34.2 (*)   ? All other components within normal limits  ?CBG MONITORING, ED - Abnormal; Notable for the following components:  ? Glucose-Capillary 125 (*)   ? All other components within normal limits  ?URINALYSIS, ROUTINE W REFLEX MICROSCOPIC  ?I-STAT BETA HCG BLOOD, ED ([facility], [facility], [facility] ONLY)  ? ? ?EKG ?None ? ? ?Radiology ?DG Chest 2 View ? ?Result Date: 08/26/2021 ?CLINICAL DATA:  Recent syncopal episode EXAM: CHEST - 2 VIEW COMPARISON:  10/12/2017 FINDINGS: The heart size and mediastinal contours are within normal limits. Both lungs are clear. The visualized skeletal structures are unremarkable. Postsurgical changes are noted in the left apex. IMPRESSION:  No acute abnormality noted. Electronically Signed   By: Inez Catalina M.D.   On: 08/26/2021 02:03  ? ?CT HEAD WO CONTRAST (5MM) ? ?Result Date: 08/26/2021 ?CLINICAL DATA:  Headache, new or worsening, post traumatic (A

## 2021-08-26 NOTE — ED Triage Notes (Signed)
Patient BIB from home c/o syncope. Per report pt got dizzy and lightheaded after bending down to massage her leg. Per report pt had LOC and found her self lying in the floor with a wound in the back of her head. 500 ml Bolus given by EMS. Pt a/xo4.  ? ?BP 115/72 ?HR 75 ?RR 20 ?O2sat 100% on RA ?

## 2021-11-09 IMAGING — CT CT ABD-PELV W/ CM
2 of 5 series · 16 of 46 positions shown, 18 images · IV contrast (omnipaque)
Comparison: CT abdomen/pelvis 10/21/2020

CLINICAL DATA: Left lower quadrant abdominal pain, left flank pain,
diarrhea, nausea

EXAM:
CT ABDOMEN AND PELVIS WITH CONTRAST
TECHNIQUE: Multidetector CT imaging of the abdomen and pelvis was performed
using the standard protocol following bolus administration of
intravenous contrast.
CONTRAST:  100mL OMNIPAQUE IOHEXOL 300 MG/ML  SOLN

[Series 2: axial st · axial · 0.85mm/px · z∈[-475,-50]mm · 13 of 95 slices shown, 15 images]
[im 5/95  soft-tissue]
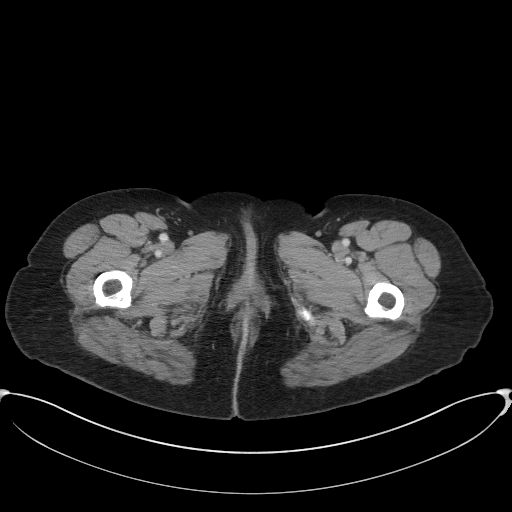
[im 5/95  bone]
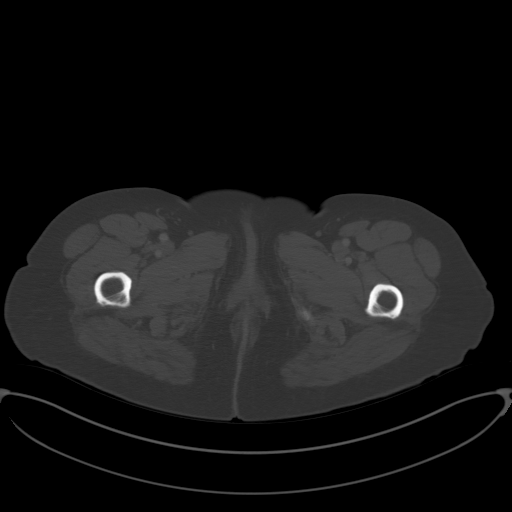
[im 15/95  soft-tissue]
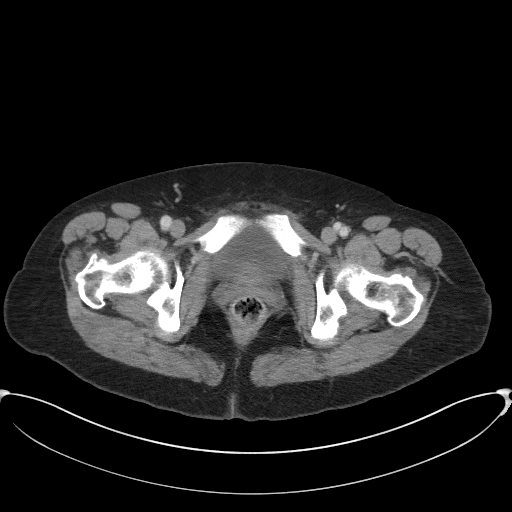
[im 20/95  soft-tissue]
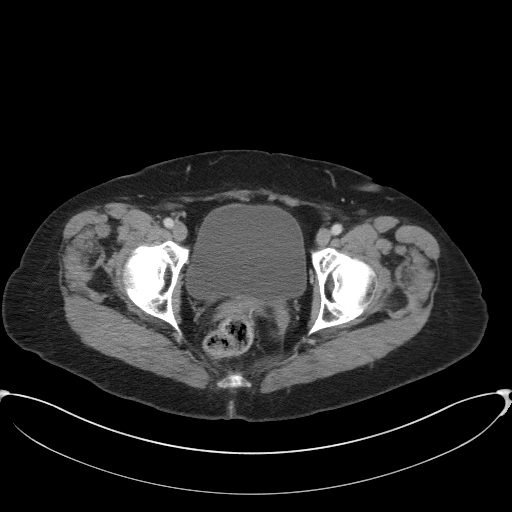
[im 25/95  soft-tissue]
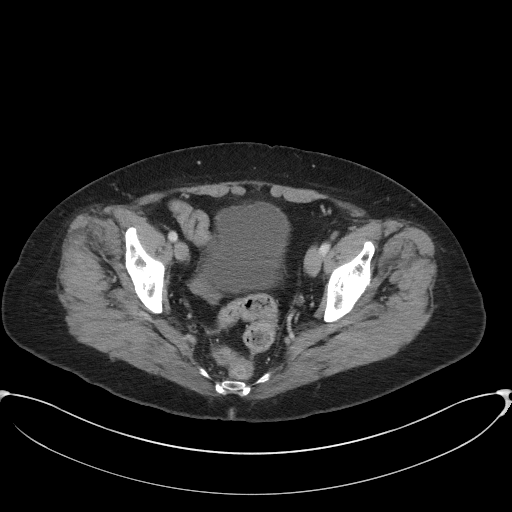
[im 35/95  soft-tissue]
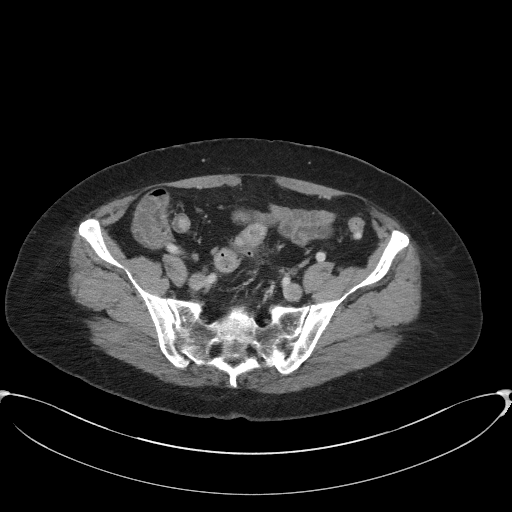
[im 40/95  soft-tissue]
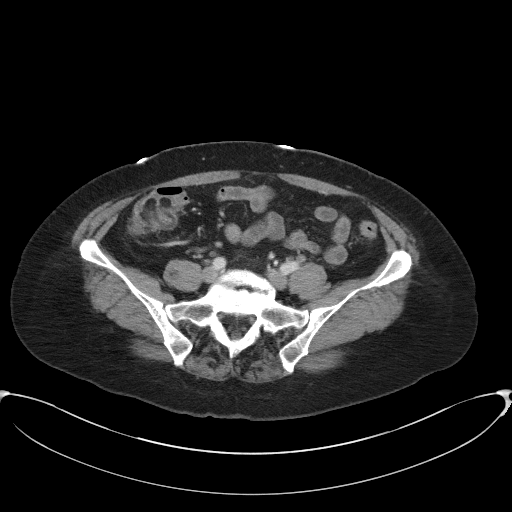
[im 50/95  soft-tissue]
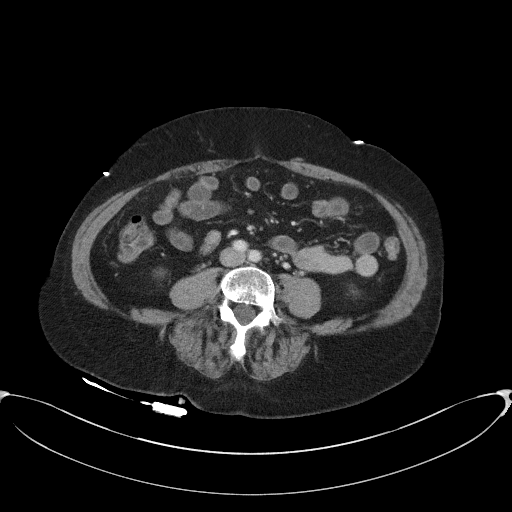
[im 55/95  soft-tissue]
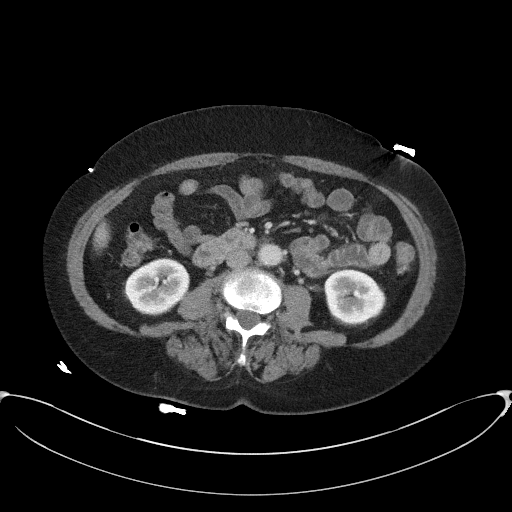
[im 60/95  soft-tissue]
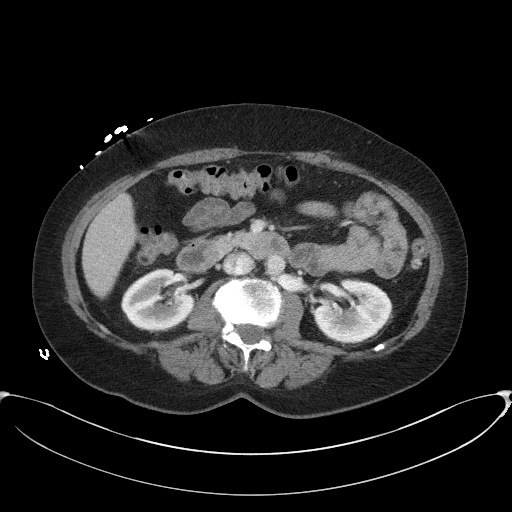
[im 60/95  bone]
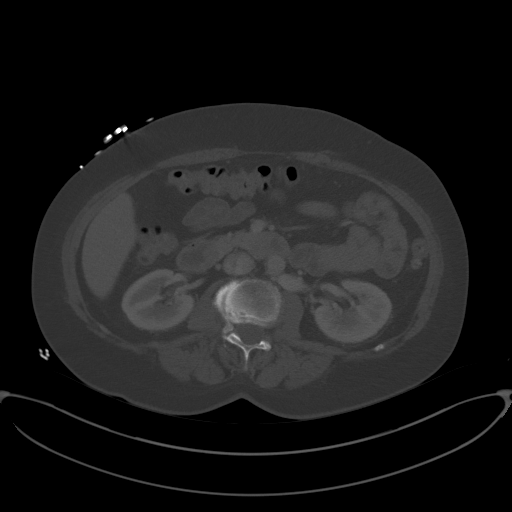
[im 70/95  soft-tissue]
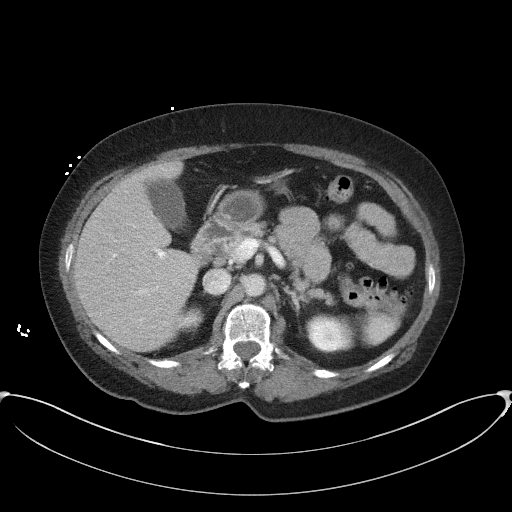
[im 75/95  soft-tissue]
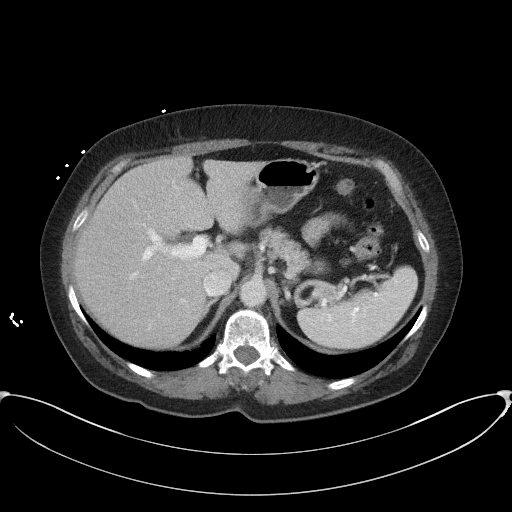
[im 80/95  soft-tissue]
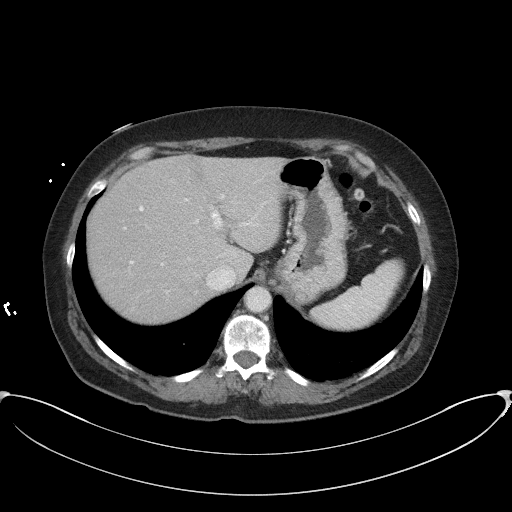
[im 90/95  soft-tissue]
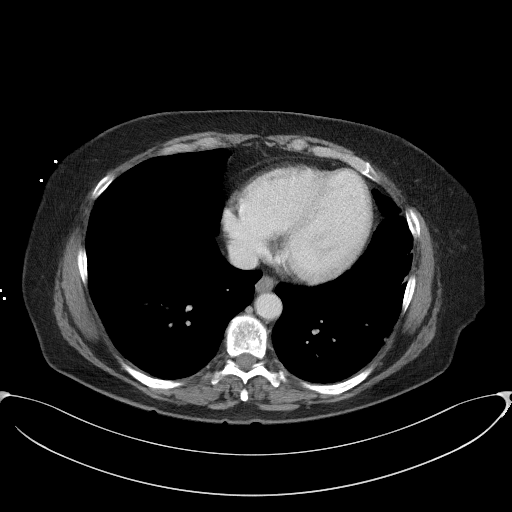

[Series 5: coronal st · coronal · 0.76mm/px · 3 of 92 slices shown]
[im 31/92  soft-tissue]
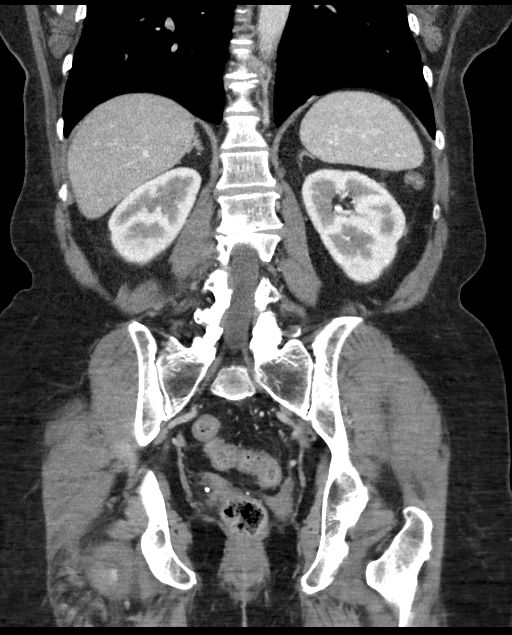
[im 41/92  soft-tissue]
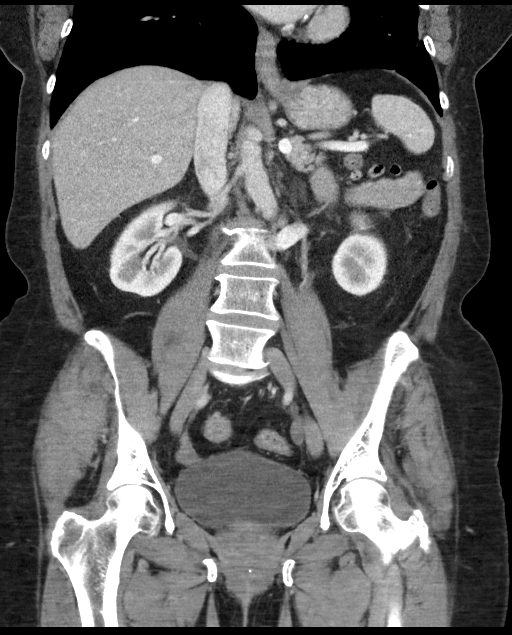
[im 51/92  soft-tissue]
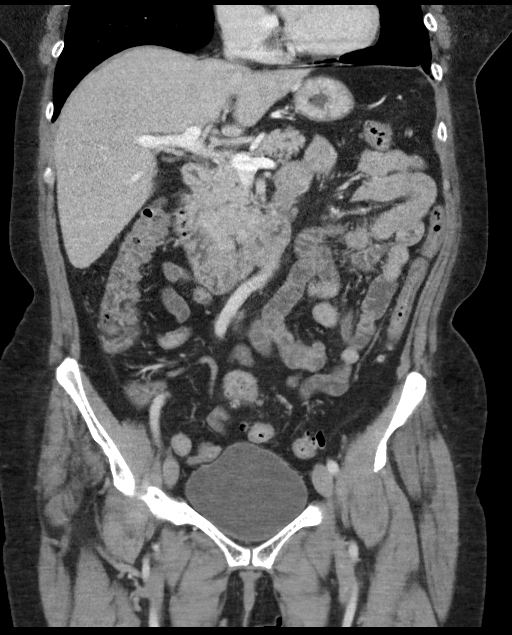

[16 of 46 positions shown; findings below may reference images not displayed]

FINDINGS: Lower chest: There is minimal subsegmental atelectasis and/or scar
in the left base. The imaged heart is unremarkable.

Hepatobiliary: The liver and gallbladder are unremarkable. There is
no biliary ductal dilatation.

Pancreas: Unremarkable.

Spleen: Unremarkable.

Adrenals/Urinary Tract: The adrenals are unremarkable.

The kidneys are unremarkable, with no focal lesion, stone,
hydronephrosis, or hydroureter. The bladder is unremarkable.

Stomach/Bowel: The stomach is unremarkable. There is no evidence of
bowel obstruction.

There is colonic diverticulosis. There is mild wall thickening
involving the sigmoid colon with mild surrounding inflammatory
change (5-50, 2-61). There is no other abnormal bowel wall
thickening or inflammatory change.

Vascular/Lymphatic: There is minimal calcified atherosclerotic
plaque in the nonaneurysmal abdominal aorta. The major branch
vessels are patent. The main portal and splenic veins are patent.

Reproductive: The uterus is surgically absent. There is a
subcentimeter cyst in the left adnexa, unchanged. There is no right
adnexal mass.

Other: There is no ascites or free air.

Musculoskeletal: There is mild degenerative change of the lumbar
spine. There is no acute osseous abnormality or aggressive osseous
lesion.
IMPRESSION: Colonic diverticulosis with mild wall thickening and surrounding
inflammatory change involving the sigmoid colon suggesting mild
acute uncomplicated diverticulitis.

Aortic Atherosclerosis ([id number]-U0K.K).

## 2021-11-21 ENCOUNTER — Other Ambulatory Visit: Payer: Self-pay

## 2021-11-21 MED ORDER — TRIAMTERENE-HCTZ 37.5-25 MG PO TABS: 1.0000 | ORAL_TABLET | Freq: Every day | ORAL | 3 refills | Status: DC

## 2022-06-15 ENCOUNTER — Other Ambulatory Visit: Payer: Self-pay | Admitting: Internal Medicine

## 2022-09-10 ENCOUNTER — Other Ambulatory Visit: Payer: Self-pay | Admitting: Internal Medicine

## 2022-10-19 ENCOUNTER — Encounter: Payer: Self-pay | Admitting: Internal Medicine

## 2022-10-19 ENCOUNTER — Ambulatory Visit: Payer: 59 | Admitting: Internal Medicine

## 2022-10-19 VITALS — BP 110/72 | HR 67 | Temp 98.6°F | Ht 66.0 in | Wt 165.0 lb

## 2022-10-19 DIAGNOSIS — Z1231 Encounter for screening mammogram for malignant neoplasm of breast: Secondary | ICD-10-CM

## 2022-10-19 DIAGNOSIS — R5383 Other fatigue: Secondary | ICD-10-CM

## 2022-10-19 DIAGNOSIS — R5382 Chronic fatigue, unspecified: Secondary | ICD-10-CM | POA: Insufficient documentation

## 2022-10-19 DIAGNOSIS — Z0000 Encounter for general adult medical examination without abnormal findings: Secondary | ICD-10-CM

## 2022-10-19 LAB — CBC WITH DIFFERENTIAL/PLATELET
Basophils Absolute: 0.1 10*3/uL (ref 0.0–0.1)
Basophils Relative: 0.9 % (ref 0.0–3.0)
Eosinophils Absolute: 0.2 10*3/uL (ref 0.0–0.7)
Eosinophils Relative: 3.2 % (ref 0.0–5.0)
HCT: 39.5 % (ref 36.0–46.0)
Hemoglobin: 13.7 g/dL (ref 12.0–15.0)
Lymphocytes Relative: 36.5 % (ref 12.0–46.0)
Lymphs Abs: 2.3 10*3/uL (ref 0.7–4.0)
MCHC: 34.7 g/dL (ref 30.0–36.0)
MCV: 97.3 fl (ref 78.0–100.0)
Monocytes Absolute: 0.5 10*3/uL (ref 0.1–1.0)
Monocytes Relative: 7.9 % (ref 3.0–12.0)
Neutro Abs: 3.2 10*3/uL (ref 1.4–7.7)
Neutrophils Relative %: 51.5 % (ref 43.0–77.0)
Platelets: 335 10*3/uL (ref 150.0–400.0)
RBC: 4.06 Mil/uL (ref 3.87–5.11)
RDW: 12.5 % (ref 11.5–15.5)
WBC: 6.3 10*3/uL (ref 4.0–10.5)

## 2022-10-19 LAB — VITAMIN D 25 HYDROXY (VIT D DEFICIENCY, FRACTURES): VITD: 24.8 ng/mL — ABNORMAL LOW (ref 30.00–100.00)

## 2022-10-19 LAB — LDL CHOLESTEROL, DIRECT: Direct LDL: 151 mg/dL

## 2022-10-19 LAB — COMPREHENSIVE METABOLIC PANEL
ALT: 11 U/L (ref 0–35)
AST: 14 U/L (ref 0–37)
Albumin: 4.4 g/dL (ref 3.5–5.2)
Alkaline Phosphatase: 53 U/L (ref 39–117)
BUN: 17 mg/dL (ref 6–23)
CO2: 31 mEq/L (ref 19–32)
Calcium: 9.5 mg/dL (ref 8.4–10.5)
Chloride: 100 mEq/L (ref 96–112)
Creatinine, Ser: 0.73 mg/dL (ref 0.40–1.20)
GFR: 89.44 mL/min (ref >60.00)
Glucose, Bld: 99 mg/dL (ref 70–99)
Potassium: 3.9 mEq/L (ref 3.5–5.1)
Sodium: 139 mEq/L (ref 135–145)
Total Bilirubin: 0.3 mg/dL (ref 0.2–1.2)
Total Protein: 7 g/dL (ref 6.0–8.3)

## 2022-10-19 LAB — URINALYSIS
Bilirubin Urine: NEGATIVE
Hgb urine dipstick: NEGATIVE
Ketones, ur: NEGATIVE
Leukocytes,Ua: NEGATIVE
Nitrite: NEGATIVE
Specific Gravity, Urine: 1.01 (ref 1.000–1.030)
Total Protein, Urine: NEGATIVE
Urine Glucose: NEGATIVE
Urobilinogen, UA: 0.2 (ref 0.0–1.0)
pH: 6.5 (ref 5.0–8.0)

## 2022-10-19 LAB — LIPID PANEL
Cholesterol: 234 mg/dL — ABNORMAL HIGH (ref 0–200)
HDL: 48.8 mg/dL (ref >39.00)
NonHDL: 184.9
Total CHOL/HDL Ratio: 5
Triglycerides: 262 mg/dL — ABNORMAL HIGH (ref 0.0–149.0)
VLDL: 52.4 mg/dL — ABNORMAL HIGH (ref 0.0–40.0)

## 2022-10-19 LAB — TSH: TSH: 0.87 u[IU]/mL (ref 0.35–5.50)

## 2022-10-19 LAB — VITAMIN B12: Vitamin B-12: 618 pg/mL (ref 211–911)

## 2022-10-19 MED ORDER — VALACYCLOVIR HCL 500 MG PO TABS: ORAL_TABLET | ORAL | 3 refills | Status: DC

## 2022-10-19 MED ORDER — TRIAMTERENE-HCTZ 37.5-25 MG PO TABS: 1.0000 | ORAL_TABLET | Freq: Every day | ORAL | 3 refills | Status: DC

## 2022-10-19 NOTE — Assessment & Plan Note (Addendum)
Check labs Rest more

## 2022-10-19 NOTE — Progress Notes (Signed)
Subjective:  Patient ID: Tamara Charles, female    DOB: 1961/02/17  Age: 62 y.o. MRN: [id number]  CC: Medication Refill (Refill to foot care)   HPI Yesly Wimbish presents for a well exam  Outpatient Medications Prior to Visit  Medication Sig Dispense Refill  . aspirin EC 81 MG tablet Take 1 tablet (81 mg total) by mouth daily. 100 tablet 3  . b complex vitamins tablet Take 1 tablet by mouth daily. 100 tablet 3  . calcium carbonate (TUMS - DOSED IN MG ELEMENTAL CALCIUM) 500 MG chewable tablet Chew 1 tablet by mouth 3 (three) times daily as needed for indigestion or heartburn.    . Cholecalciferol (VITAMIN D3) 50 MCG (2000 UT) capsule Take 1 capsule (2,000 Units total) by mouth daily. 100 capsule 3  . Melatonin 10 MG TABS Take 10 mg by mouth at bedtime.    . meloxicam (MOBIC) 15 MG tablet TAKE 1 TABLET BY MOUTH EVERY DAY 30 tablet 2  . naproxen sodium (ANAPROX) 220 MG tablet Take 220 mg by mouth 2 (two) times daily as needed (pain).    . Probiotic Product (ALIGN) 4 MG CAPS Take 1 capsule (4 mg total) by mouth daily. (Patient taking differently: Take 1 capsule by mouth at bedtime.) 30 capsule 1  . traMADol (ULTRAM) 50 MG tablet Take 1 to 2 tablets by mouth every 6 hours as needed for moderate pain or severe pain. 20 tablet 0  . trimethoprim (TRIMPEX) 100 MG tablet Take 100 mg by mouth at bedtime.    . vitamin B-12 (CYANOCOBALAMIN) 1000 MCG tablet Take 2,000 mcg by mouth daily.    [name] zinc gluconate 50 MG tablet Take 50 mg by mouth daily.    [name] triamterene-hydrochlorothiazide (MAXZIDE-25) 37.5-25 MG tablet Take 1 tablet by mouth daily. 90 tablet 3  . valACYclovir (VALTREX) 500 MG tablet TAKE 1 TABLET BY MOUTH EVERYDAY AT BEDTIME 90 tablet 0   No facility-administered medications prior to visit.    ROS: Review of Systems  Constitutional:  Positive for fatigue. Negative for activity change, appetite change, chills and unexpected weight change.  HENT:  Negative for congestion, mouth  sores and sinus pressure.   Eyes:  Negative for visual disturbance.  Respiratory:  Negative for cough and chest tightness.   Gastrointestinal:  Negative for abdominal pain and nausea.  Genitourinary:  Negative for difficulty urinating, frequency and vaginal pain.  Musculoskeletal:  Negative for back pain and gait problem.  Skin:  Negative for pallor and rash.  Neurological:  Negative for dizziness, tremors, weakness, numbness and headaches.  Psychiatric/Behavioral:  Negative for confusion and sleep disturbance.     Objective:  BP 110/72 (BP Location: Left Arm, Patient Position: Sitting, Cuff Size: Large)   Pulse 67   Temp 98.6 F (37 C) (Oral)   Ht 5\' 6"  (1.676 m)   Wt 165 lb (74.8 kg)   LMP 06/05/2008   SpO2 96%   BMI 26.63 kg/m   BP Readings from Last 3 Encounters:  10/19/22 110/72  08/26/21 112/69  07/17/21 98/66    Wt Readings from Last 3 Encounters:  10/19/22 165 lb (74.8 kg)  08/26/21 180 lb (81.6 kg)  07/16/21 181 lb 3.5 oz (82.2 kg)    Physical Exam Constitutional:      General: She is not in acute distress.    Appearance: Normal appearance. She is well-developed.  HENT:     Head: Normocephalic.     Right Ear: External ear normal.  Left Ear: External ear normal.     Nose: Nose normal.  Eyes:     General:        Right eye: No discharge.        Left eye: No discharge.     Conjunctiva/sclera: Conjunctivae normal.     Pupils: Pupils are equal, round, and reactive to light.  Neck:     Thyroid: No thyromegaly.     Vascular: No JVD.     Trachea: No tracheal deviation.  Cardiovascular:     Rate and Rhythm: Normal rate and regular rhythm.     Heart sounds: Normal heart sounds.  Pulmonary:     Effort: No respiratory distress.     Breath sounds: No stridor. No wheezing.  Abdominal:     General: Bowel sounds are normal. There is no distension.     Palpations: Abdomen is soft. There is no mass.     Tenderness: There is no abdominal tenderness. There is no  guarding or rebound.  Musculoskeletal:        General: No tenderness.     Cervical back: Normal range of motion and neck supple. No rigidity.  Lymphadenopathy:     Cervical: No cervical adenopathy.  Skin:    Findings: No erythema or rash.  Neurological:     Mental Status: She is oriented to person, place, and time.     Cranial Nerves: No cranial nerve deficit.     Motor: No abnormal muscle tone.     Coordination: Coordination normal.     Deep Tendon Reflexes: Reflexes normal.  Psychiatric:        Behavior: Behavior normal.        Thought Content: Thought content normal.        Judgment: Judgment normal.    Lab Results  Component Value Date   WBC 6.2 08/26/2021   HGB 11.5 (L) 08/26/2021   HCT 33.6 (L) 08/26/2021   PLT 266 08/26/2021   GLUCOSE 111 (H) 08/26/2021   CHOL 191 01/28/2018   TRIG 111.0 01/28/2018   HDL 43.70 01/28/2018   LDLDIRECT 146.0 08/25/2014   LDLCALC 125 (H) 01/28/2018   ALT 12 03/17/2021   AST 18 03/17/2021   NA 133 (L) 08/26/2021   K 3.0 (L) 08/26/2021   CL 102 08/26/2021   CREATININE 0.65 08/26/2021   BUN 18 08/26/2021   CO2 26 08/26/2021   TSH 0.65 07/09/2019   HGBA1C 5.6 06/20/2021    CT HEAD WO CONTRAST ( )  Result Date: 08/26/2021 CLINICAL DATA:  Headache, new or worsening, post traumatic (Age 50-49y) syncope, head trauma, laceration to back of head EXAM: CT HEAD WITHOUT CONTRAST TECHNIQUE: Contiguous axial images were obtained from the base of the skull through the vertex without intravenous contrast. RADIATION DOSE REDUCTION: This exam was performed according to the departmental dose-optimization program which includes automated exposure control, adjustment of the mA and/or kV according to patient size and/or use of iterative reconstruction technique. COMPARISON:  None. FINDINGS: Brain: No evidence of large-territorial acute infarction. No parenchymal hemorrhage. No mass lesion. No extra-axial collection. No mass effect or midline shift. No  hydrocephalus. Basilar cisterns are patent. Vascular: No hyperdense vessel. Skull: No acute fracture or focal lesion. Sinuses/Orbits: Paranasal sinuses and mastoid air cells are clear. The orbits are unremarkable. Other: None. IMPRESSION: No acute intracranial abnormality. Electronically Signed   By: Tish Frederickson M.D.   On: 08/26/2021 02:06   DG Chest 2 View  Result Date: 08/26/2021 CLINICAL DATA:  Recent syncopal  episode EXAM: CHEST - 2 VIEW COMPARISON:  10/12/2017 FINDINGS: The heart size and mediastinal contours are within normal limits. Both lungs are clear. The visualized skeletal structures are unremarkable. Postsurgical changes are noted in the left apex. IMPRESSION: No acute abnormality noted. Electronically Signed   By: Alcide Clever M.D.   On: 08/26/2021 02:03    Assessment & Plan:   Problem List Items Addressed This Visit     Well adult exam - Primary   Relevant Orders   TSH   Urinalysis   CBC with Differential/Platelet   Lipid panel   Comprehensive metabolic panel   Vitamin B12   VITAMIN D 25 Hydroxy (Vit-D Deficiency, Fractures)   MM Digital Screening   Fatigue    Check labs Rest more      Relevant Orders   Vitamin B12   VITAMIN D 25 Hydroxy (Vit-D Deficiency, Fractures)   Other Visit Diagnoses     Screening mammogram for breast cancer       Relevant Orders   MM Digital Screening         Meds ordered this encounter  Medications  . valACYclovir (VALTREX) 500 MG tablet    Sig: TAKE 1 TABLET BY MOUTH EVERYDAY AT BEDTIME    Dispense:  90 tablet    Refill:  3  . triamterene-hydrochlorothiazide (MAXZIDE-25) 37.5-25 MG tablet    Sig: Take 1 tablet by mouth daily.    Dispense:  90 tablet    Refill:  3      Follow-up: Return in about 1 year (around 10/19/2023) for Wellness Exam.  Sonda Primes, MD

## 2022-12-15 ENCOUNTER — Ambulatory Visit: Payer: 59

## 2023-03-12 ENCOUNTER — Ambulatory Visit: Payer: Self-pay | Admitting: Internal Medicine

## 2023-08-30 ENCOUNTER — Encounter: Payer: Self-pay | Admitting: Internal Medicine

## 2023-08-30 ENCOUNTER — Ambulatory Visit (INDEPENDENT_AMBULATORY_CARE_PROVIDER_SITE_OTHER): Payer: Self-pay | Admitting: Internal Medicine

## 2023-08-30 VITALS — BP 138/82 | HR 67 | Temp 98.0°F | Ht 66.0 in | Wt 168.6 lb

## 2023-08-30 DIAGNOSIS — R202 Paresthesia of skin: Secondary | ICD-10-CM

## 2023-08-30 DIAGNOSIS — I10 Essential (primary) hypertension: Secondary | ICD-10-CM | POA: Diagnosis not present

## 2023-08-30 DIAGNOSIS — R252 Cramp and spasm: Secondary | ICD-10-CM

## 2023-08-30 DIAGNOSIS — R739 Hyperglycemia, unspecified: Secondary | ICD-10-CM | POA: Diagnosis not present

## 2023-08-30 DIAGNOSIS — E559 Vitamin D deficiency, unspecified: Secondary | ICD-10-CM

## 2023-08-30 DIAGNOSIS — M1990 Unspecified osteoarthritis, unspecified site: Secondary | ICD-10-CM | POA: Diagnosis not present

## 2023-08-30 DIAGNOSIS — K5792 Diverticulitis of intestine, part unspecified, without perforation or abscess without bleeding: Secondary | ICD-10-CM

## 2023-08-30 MED ORDER — MELOXICAM 15 MG PO TABS: 15.0000 mg | ORAL_TABLET | Freq: Every day | ORAL | 1 refills | Status: DC | PRN

## 2023-08-30 MED ORDER — AMLODIPINE BESYLATE 5 MG PO TABS: 5.0000 mg | ORAL_TABLET | Freq: Every day | ORAL | 3 refills | Status: DC

## 2023-08-30 NOTE — Progress Notes (Signed)
 Subjective:  Patient ID: Tamara Charles, female    DOB: [birth year]  Age: 63 y.o. MRN: [id number]  CC: Medical Management of Chronic Issues (Follow Up (last seen 10/19/23), labs. Patient notes of pain/cramps within legs and feet. Also experiencing extreme amounts of fatigue. Wants to make sure all levels are good. Refill on Valtrex. Referral for mammogram ([facility] preferred))   HPI Tamara Charles presents for Medical Management of Chronic Issues (Follow Up (last seen 10/19/23), labs. Patient notes of pain/cramps within legs and feet. Also experiencing extreme amounts of fatigue. Wants to make sure all levels are good. Refill on Valtrex. Referral for mammogram ([facility] preferred))   Meloxicam helped  Outpatient Medications Prior to Visit  Medication Sig Dispense Refill   aspirin EC 81 MG tablet Take 1 tablet (81 mg total) by mouth daily. 100 tablet 3   b complex vitamins tablet Take 1 tablet by mouth daily. 100 tablet 3   calcium carbonate (TUMS - DOSED IN MG ELEMENTAL CALCIUM) 500 MG chewable tablet Chew 1 tablet by mouth 3 (three) times daily as needed for indigestion or heartburn.     Cholecalciferol (VITAMIN D3) 50 MCG (2000 UT) capsule Take 1 capsule (2,000 Units total) by mouth daily. 100 capsule 3   Melatonin 10 MG TABS Take 10 mg by mouth at bedtime.     naproxen sodium (ANAPROX) 220 MG tablet Take 220 mg by mouth 2 (two) times daily as needed (pain).     Probiotic Product (ALIGN) 4 MG CAPS Take 1 capsule (4 mg total) by mouth daily. (Patient taking differently: Take 1 capsule by mouth at bedtime.) 30 capsule 1   triamterene-hydrochlorothiazide (MAXZIDE-25) 37.5-25 MG tablet Take 1 tablet by mouth daily. 90 tablet 3   trimethoprim (TRIMPEX) 100 MG tablet Take 100 mg by mouth at bedtime.     valACYclovir (VALTREX) 500 MG tablet TAKE 1 TABLET BY MOUTH EVERYDAY AT BEDTIME 90 tablet 3   vitamin B-12 (CYANOCOBALAMIN) 1000 MCG tablet Take 2,000 mcg by mouth daily.     zinc gluconate 50 MG  tablet Take 50 mg by mouth daily.     meloxicam (MOBIC) 15 MG tablet TAKE 1 TABLET BY MOUTH EVERY DAY 30 tablet 2   traMADol (ULTRAM) 50 MG tablet Take 1 to 2 tablets by mouth every 6 hours as needed for moderate pain or severe pain. (Patient not taking: Reported on 08/30/2023) 20 tablet 0   No facility-administered medications prior to visit.    ROS: Review of Systems  Constitutional:  Negative for activity change, appetite change, chills, fatigue and unexpected weight change.  HENT:  Negative for congestion, mouth sores and sinus pressure.   Eyes:  Negative for visual disturbance.  Respiratory:  Negative for cough and chest tightness.   Gastrointestinal:  Negative for abdominal pain and nausea.  Genitourinary:  Negative for difficulty urinating, frequency and vaginal pain.  Musculoskeletal:  Positive for arthralgias, back pain and gait problem.  Skin:  Negative for pallor and rash.  Neurological:  Negative for dizziness, tremors, weakness, numbness and headaches.  Psychiatric/Behavioral:  Negative for confusion, sleep disturbance and suicidal ideas.     Objective:  BP 138/82   Pulse 67   Temp 98 F (36.7 C)   Ht 5\' 6"  (1.676 m)   Wt 168 lb 9.6 oz (76.5 kg)   LMP 06/05/2008   SpO2 96%   BMI 27.21 kg/m   BP Readings from Last 3 Encounters:  08/30/23 138/82  10/19/22 110/72  08/26/21 112/69  Wt Readings from Last 3 Encounters:  08/30/23 168 lb 9.6 oz (76.5 kg)  10/19/22 165 lb (74.8 kg)  08/26/21 180 lb (81.6 kg)    Physical Exam Constitutional:      General: She is not in acute distress.    Appearance: She is well-developed.  HENT:     Head: Normocephalic.     Right Ear: External ear normal.     Left Ear: External ear normal.     Nose: Nose normal.  Eyes:     General:        Right eye: No discharge.        Left eye: No discharge.     Conjunctiva/sclera: Conjunctivae normal.     Pupils: Pupils are equal, round, and reactive to light.  Neck:     Thyroid: No  thyromegaly.     Vascular: No JVD.     Trachea: No tracheal deviation.  Cardiovascular:     Rate and Rhythm: Normal rate and regular rhythm.     Heart sounds: Normal heart sounds.  Pulmonary:     Effort: No respiratory distress.     Breath sounds: No stridor. No wheezing.  Abdominal:     General: Bowel sounds are normal. There is no distension.     Palpations: Abdomen is soft. There is no mass.     Tenderness: There is no abdominal tenderness. There is no guarding or rebound.  Musculoskeletal:        General: Tenderness present.     Cervical back: Normal range of motion and neck supple. No rigidity.  Lymphadenopathy:     Cervical: No cervical adenopathy.  Skin:    Findings: No erythema or rash.  Neurological:     Mental Status: She is oriented to person, place, and time.     Cranial Nerves: No cranial nerve deficit.     Motor: No abnormal muscle tone.     Coordination: Coordination normal.     Gait: Gait abnormal.     Deep Tendon Reflexes: Reflexes normal.  Psychiatric:        Behavior: Behavior normal.        Thought Content: Thought content normal.        Judgment: Judgment normal.   Antalgic arthritic gait.  Stiff joints and back  Lab Results  Component Value Date   WBC 5.6 08/30/2023   HGB 13.4 08/30/2023   HCT 39.3 08/30/2023   PLT 299.0 08/30/2023   GLUCOSE 91 08/30/2023   CHOL 195 08/30/2023   TRIG 271.0 (H) 08/30/2023   HDL 44.30 08/30/2023   LDLDIRECT 151.0 10/19/2022   LDLCALC 96 08/30/2023   ALT 18 08/30/2023   AST 20 08/30/2023   NA 141 08/30/2023   K 4.2 08/30/2023   CL 103 08/30/2023   CREATININE 0.67 08/30/2023   BUN 15 08/30/2023   CO2 29 08/30/2023   TSH 1.23 08/30/2023   HGBA1C 5.6 06/20/2021    CT HEAD WO CONTRAST ( ) Result Date: 08/26/2021 CLINICAL DATA:  Headache, new or worsening, post traumatic (Age 60-49y) syncope, head trauma, laceration to back of head EXAM: CT HEAD WITHOUT CONTRAST TECHNIQUE: Contiguous axial images were  obtained from the base of the skull through the vertex without intravenous contrast. RADIATION DOSE REDUCTION: This exam was performed according to the departmental dose-optimization program which includes automated exposure control, adjustment of the mA and/or kV according to patient size and/or use of iterative reconstruction technique. COMPARISON:  None. FINDINGS: Brain: No evidence of large-territorial acute infarction.  No parenchymal hemorrhage. No mass lesion. No extra-axial collection. No mass effect or midline shift. No hydrocephalus. Basilar cisterns are patent. Vascular: No hyperdense vessel. Skull: No acute fracture or focal lesion. Sinuses/Orbits: Paranasal sinuses and mastoid air cells are clear. The orbits are unremarkable. Other: None. IMPRESSION: No acute intracranial abnormality. Electronically Signed   By: Tish Frederickson M.D.   On: 08/26/2021 02:06   DG Chest 2 View Result Date: 08/26/2021 CLINICAL DATA:  Recent syncopal episode EXAM: CHEST - 2 VIEW COMPARISON:  10/12/2017 FINDINGS: The heart size and mediastinal contours are within normal limits. Both lungs are clear. The visualized skeletal structures are unremarkable. Postsurgical changes are noted in the left apex. IMPRESSION: No acute abnormality noted. Electronically Signed   By: Alcide Clever M.D.   On: 08/26/2021 02:03    Assessment & Plan:   Problem List Items Addressed This Visit     Hypertension - Primary   Meds renewed  Hold BP meds for 2 days      Relevant Medications   amLODipine (NORVASC) 5 MG tablet   Other Relevant Orders   TSH (Completed)   Urinalysis   CBC with Differential/Platelet (Completed)   Lipid panel (Completed)   Comprehensive metabolic panel with GFR (Completed)   Vitamin B12 (Completed)   VITAMIN D 25 Hydroxy (Vit-D Deficiency, Fractures) (Completed)   Paresthesia   Relevant Orders   Vitamin B12 (Completed)   Vitamin D deficiency   Relevant Orders   VITAMIN D 25 Hydroxy (Vit-D Deficiency,  Fractures) (Completed)   Leg cramps   Relevant Orders   TSH (Completed)   Urinalysis   CBC with Differential/Platelet (Completed)   Lipid panel (Completed)   Comprehensive metabolic panel with GFR (Completed)   Vitamin B12 (Completed)   VITAMIN D 25 Hydroxy (Vit-D Deficiency, Fractures) (Completed)   Osteoarthritis   Relevant Medications   meloxicam (MOBIC) 15 MG tablet   Other Relevant Orders   TSH (Completed)   Urinalysis   CBC with Differential/Platelet (Completed)   Lipid panel (Completed)   Comprehensive metabolic panel with GFR (Completed)   Vitamin B12 (Completed)   VITAMIN D 25 Hydroxy (Vit-D Deficiency, Fractures) (Completed)      Meds ordered this encounter  Medications   meloxicam (MOBIC) 15 MG tablet    Sig: Take 1 tablet (15 mg total) by mouth daily as needed for pain.    Dispense:  90 tablet    Refill:  1   amLODipine (NORVASC) 5 MG tablet    Sig: Take 1 tablet (5 mg total) by mouth daily.    Dispense:  90 tablet    Refill:  3      Follow-up: Return in about 2 months (around 10/30/2023) for Wellness Exam.  Sonda Primes, MD

## 2023-08-30 NOTE — Patient Instructions (Signed)

## 2023-08-30 NOTE — Assessment & Plan Note (Signed)
Meds renewed  Hold BP meds for 2 days

## 2023-08-31 LAB — CBC WITH DIFFERENTIAL/PLATELET
Basophils Absolute: 0 10*3/uL (ref 0.0–0.1)
Basophils Relative: 0.6 % (ref 0.0–3.0)
Eosinophils Absolute: 0.2 10*3/uL (ref 0.0–0.7)
Eosinophils Relative: 4.1 % (ref 0.0–5.0)
HCT: 39.3 % (ref 36.0–46.0)
Hemoglobin: 13.4 g/dL (ref 12.0–15.0)
Lymphocytes Relative: 37.7 % (ref 12.0–46.0)
Lymphs Abs: 2.1 10*3/uL (ref 0.7–4.0)
MCHC: 34.2 g/dL (ref 30.0–36.0)
MCV: 97.9 fl (ref 78.0–100.0)
Monocytes Absolute: 0.5 10*3/uL (ref 0.1–1.0)
Monocytes Relative: 8.5 % (ref 3.0–12.0)
Neutro Abs: 2.7 10*3/uL (ref 1.4–7.7)
Neutrophils Relative %: 49.1 % (ref 43.0–77.0)
Platelets: 299 10*3/uL (ref 150.0–400.0)
RBC: 4.02 Mil/uL (ref 3.87–5.11)
RDW: 12.9 % (ref 11.5–15.5)
WBC: 5.6 10*3/uL (ref 4.0–10.5)

## 2023-08-31 LAB — COMPREHENSIVE METABOLIC PANEL WITH GFR
ALT: 18 U/L (ref 0–35)
AST: 20 U/L (ref 0–37)
Albumin: 4.5 g/dL (ref 3.5–5.2)
Alkaline Phosphatase: 52 U/L (ref 39–117)
BUN: 15 mg/dL (ref 6–23)
CO2: 29 meq/L (ref 19–32)
Calcium: 9.6 mg/dL (ref 8.4–10.5)
Chloride: 103 meq/L (ref 96–112)
Creatinine, Ser: 0.67 mg/dL (ref 0.40–1.20)
GFR: 94.48 mL/min (ref >60.00)
Glucose, Bld: 91 mg/dL (ref 70–99)
Potassium: 4.2 meq/L (ref 3.5–5.1)
Sodium: 141 meq/L (ref 135–145)
Total Bilirubin: 0.3 mg/dL (ref 0.2–1.2)
Total Protein: 6.8 g/dL (ref 6.0–8.3)

## 2023-08-31 LAB — URINALYSIS, ROUTINE W REFLEX MICROSCOPIC
Bilirubin Urine: NEGATIVE
Hgb urine dipstick: NEGATIVE
Ketones, ur: NEGATIVE
Leukocytes,Ua: NEGATIVE
Nitrite: POSITIVE — AB
Specific Gravity, Urine: 1.015 (ref 1.000–1.030)
Total Protein, Urine: NEGATIVE
Urine Glucose: NEGATIVE
Urobilinogen, UA: 0.2 (ref 0.0–1.0)
pH: 7.5 (ref 5.0–8.0)

## 2023-08-31 LAB — TSH: TSH: 1.23 u[IU]/mL (ref 0.35–5.50)

## 2023-08-31 LAB — LIPID PANEL
Cholesterol: 195 mg/dL (ref 0–200)
HDL: 44.3 mg/dL (ref >39.00)
LDL Cholesterol: 96 mg/dL (ref 0–99)
NonHDL: 150.4
Total CHOL/HDL Ratio: 4
Triglycerides: 271 mg/dL — ABNORMAL HIGH (ref 0.0–149.0)
VLDL: 54.2 mg/dL — ABNORMAL HIGH (ref 0.0–40.0)

## 2023-08-31 LAB — VITAMIN D 25 HYDROXY (VIT D DEFICIENCY, FRACTURES): VITD: 31.69 ng/mL (ref 30.00–100.00)

## 2023-08-31 LAB — VITAMIN B12: Vitamin B-12: 386 pg/mL (ref 211–911)

## 2023-09-03 ENCOUNTER — Encounter: Payer: Self-pay | Admitting: Internal Medicine

## 2023-09-03 ENCOUNTER — Other Ambulatory Visit: Payer: Self-pay | Admitting: Internal Medicine

## 2023-09-03 MED ORDER — NITROFURANTOIN MONOHYD MACRO 100 MG PO CAPS: 100.0000 mg | ORAL_CAPSULE | Freq: Two times a day (BID) | ORAL | 0 refills | Status: AC

## 2023-09-03 NOTE — Assessment & Plan Note (Signed)
Obtain hemoglobin A1c 

## 2023-09-03 NOTE — Assessment & Plan Note (Signed)
 Doing well status post partial colectomy

## 2023-09-03 NOTE — Assessment & Plan Note (Signed)
 Comfortable shoes, magnesium supplements

## 2023-09-03 NOTE — Assessment & Plan Note (Signed)
 Obtain vitamin D level.  Will treat vitamin D deficiency-see prescription

## 2023-09-03 NOTE — Assessment & Plan Note (Signed)
 Chronic symptoms.  Discussed. Will use meloxicam prescription with caution Using Vit D

## 2023-09-26 ENCOUNTER — Telehealth: Payer: Self-pay | Admitting: Internal Medicine

## 2023-09-26 NOTE — Telephone Encounter (Unsigned)
 Copied from CRM [id number]. Topic: Clinical - Medication Question >> Sep 26, 2023  9:48 AM Bambi Bonine D wrote: Reason for CRM: Patient stated that she just started taking a new medication for her blood pressure. Patient stated that she is taking amLODipine  (NORVASC ) 5 MG tablet but its making her legs and ankle swollen. Patient wants to know if the medication can be changed to a different medication.

## 2023-09-27 ENCOUNTER — Encounter (HOSPITAL_BASED_OUTPATIENT_CLINIC_OR_DEPARTMENT_OTHER): Payer: Self-pay

## 2023-09-27 ENCOUNTER — Ambulatory Visit: Payer: Self-pay

## 2023-09-27 ENCOUNTER — Other Ambulatory Visit: Payer: Self-pay

## 2023-09-27 ENCOUNTER — Emergency Department (HOSPITAL_BASED_OUTPATIENT_CLINIC_OR_DEPARTMENT_OTHER)
Admission: EM | Admit: 2023-09-27 | Discharge: 2023-09-27 | Disposition: A | Discharge status: Home / Self Care | Attending: Emergency Medicine | Admitting: Emergency Medicine

## 2023-09-27 ENCOUNTER — Emergency Department (HOSPITAL_BASED_OUTPATIENT_CLINIC_OR_DEPARTMENT_OTHER)

## 2023-09-27 DIAGNOSIS — R079 Chest pain, unspecified: Secondary | ICD-10-CM | POA: Diagnosis not present

## 2023-09-27 DIAGNOSIS — R519 Headache, unspecified: Secondary | ICD-10-CM | POA: Insufficient documentation

## 2023-09-27 DIAGNOSIS — I10 Essential (primary) hypertension: Secondary | ICD-10-CM | POA: Diagnosis not present

## 2023-09-27 DIAGNOSIS — Z7982 Long term (current) use of aspirin: Secondary | ICD-10-CM | POA: Insufficient documentation

## 2023-09-27 DIAGNOSIS — I700 Atherosclerosis of aorta: Secondary | ICD-10-CM | POA: Diagnosis not present

## 2023-09-27 DIAGNOSIS — R0789 Other chest pain: Secondary | ICD-10-CM | POA: Diagnosis not present

## 2023-09-27 DIAGNOSIS — R55 Syncope and collapse: Secondary | ICD-10-CM | POA: Insufficient documentation

## 2023-09-27 DIAGNOSIS — R2240 Localized swelling, mass and lump, unspecified lower limb: Secondary | ICD-10-CM | POA: Diagnosis not present

## 2023-09-27 DIAGNOSIS — M7989 Other specified soft tissue disorders: Secondary | ICD-10-CM | POA: Diagnosis not present

## 2023-09-27 DIAGNOSIS — Z79899 Other long term (current) drug therapy: Secondary | ICD-10-CM | POA: Insufficient documentation

## 2023-09-27 DIAGNOSIS — R918 Other nonspecific abnormal finding of lung field: Secondary | ICD-10-CM | POA: Diagnosis not present

## 2023-09-27 LAB — BASIC METABOLIC PANEL WITH GFR
Anion gap: 13 (ref 5–15)
BUN: 14 mg/dL (ref 8–23)
CO2: 23 mmol/L (ref 22–32)
Calcium: 9.8 mg/dL (ref 8.9–10.3)
Chloride: 105 mmol/L (ref 98–111)
Creatinine, Ser: 0.73 mg/dL (ref 0.44–1.00)
GFR, Estimated: >60 mL/min (ref >60)
Glucose, Bld: 104 mg/dL — ABNORMAL HIGH (ref 70–99)
Potassium: 4.1 mmol/L (ref 3.5–5.1)
Sodium: 141 mmol/L (ref 135–145)

## 2023-09-27 LAB — TROPONIN T, HIGH SENSITIVITY
Troponin T High Sensitivity: <15 ng/L (ref <19)
Troponin T High Sensitivity: <15 ng/L (ref <19)

## 2023-09-27 LAB — CBC
HCT: 42 % (ref 36.0–46.0)
Hemoglobin: 13.9 g/dL (ref 12.0–15.0)
MCH: 32.6 pg (ref 26.0–34.0)
MCHC: 33.1 g/dL (ref 30.0–36.0)
MCV: 98.4 fL (ref 80.0–100.0)
Platelets: 305 10*3/uL (ref 150–400)
RBC: 4.27 MIL/uL (ref 3.87–5.11)
RDW: 12.5 % (ref 11.5–15.5)
WBC: 6 10*3/uL (ref 4.0–10.5)
nRBC: 0 % (ref 0.0–0.2)

## 2023-09-27 LAB — PRO BRAIN NATRIURETIC PEPTIDE: Pro Brain Natriuretic Peptide: 292 pg/mL (ref <300.0)

## 2023-09-27 MED ORDER — HYDRALAZINE HCL 20 MG/ML IJ SOLN
5.0000 mg | Freq: Once | INTRAMUSCULAR | Status: DC
  Filled 2023-09-27: qty 1

## 2023-09-27 NOTE — ED Provider Notes (Signed)
 [place] [facility] Provider Note   CSN: [id number] Arrival date & time: 09/27/23  1101     History  Chief Complaint  Patient presents with   Chest Pain   Near Syncope    Tamara Charles is a 63 y.o. female with medical history of arthritis, UTI, hypertension.  Patient presents to ED for evaluation of chest pain, ankle swelling.  The patient reports that for 30 years she was on HCTZ that she took for fluid retention, leg swelling and BP control.  She reports that it began to give her leg cramps and so she recently changed to amlodipine  5 mg.  Reports that when she was taken off this medication her legs did become very swollen however this reduced on its own. She changed to amlodipine  5 mg at the end of March.  She reports that since the end of March when she began taking amlodipine  she has had ankle swelling similar to ankle swelling that she experienced while she was off HCTZ.  Reports that this ankle swelling is caused her a lot of emotional distress, feeling "off".  Reports that she did not take her amlodipine  today and her ankle swelling is actually gone down.  She is also complaining of left-sided chest pain described as a "pinch" that does not radiate, not associated with shortness of breath, she is denies if there is an exertional component to this.  Reports this began today while out in the waiting room.  Also endorsing headache, lightheadedness.  Denies syncope, nausea, vomiting, fevers.  Reports she called her PCP office who advised her to come to the ED.  Reports she has not seen cardiology in quite some time.  She denies history of PE, unilateral leg swelling, hemoptysis, history of DVT or PE, recent surgery or travel, exogenous hormone use.   Chest Pain Associated symptoms: near-syncope   Near Syncope Associated symptoms include chest pain.       Home Medications Prior to Admission medications   Medication Sig Start Date End Date  Taking? Authorizing Provider  amLODipine  (NORVASC ) 5 MG tablet Take 1 tablet (5 mg total) by mouth daily. 08/30/23 08/29/24  Plotnikov, Oakley Bellman, MD  aspirin  EC 81 MG tablet Take 1 tablet (81 mg total) by mouth daily. 02/23/14   Plotnikov, Aleksei V, MD  b complex vitamins tablet Take 1 tablet by mouth daily. 07/09/19   Plotnikov, Aleksei V, MD  calcium carbonate (TUMS - DOSED IN MG ELEMENTAL CALCIUM) 500 MG chewable tablet Chew 1 tablet by mouth 3 (three) times daily as needed for indigestion or heartburn.    [provider]  Cholecalciferol (VITAMIN D3) 50 MCG (2000 UT) capsule Take 1 capsule (2,000 Units total) by mouth daily. 07/09/19   Plotnikov, Aleksei V, MD  Melatonin 10 MG TABS Take 10 mg by mouth at bedtime.    [provider]  meloxicam  (MOBIC ) 15 MG tablet Take 1 tablet (15 mg total) by mouth daily as needed for pain. 08/30/23   Plotnikov, Aleksei V, MD  naproxen sodium (ANAPROX) 220 MG tablet Take 220 mg by mouth 2 (two) times daily as needed (pain).    [provider]  nitrofurantoin , macrocrystal-monohydrate, (MACROBID ) 100 MG capsule Take 1 capsule (100 mg total) by mouth 2 (two) times daily. 09/03/23   Plotnikov, Aleksei V, MD  Probiotic Product (ALIGN) 4 MG CAPS Take 1 capsule (4 mg total) by mouth daily. Patient taking differently: Take 1 capsule by mouth at bedtime. 01/02/19  Plotnikov, Aleksei V, MD  triamterene -hydrochlorothiazide  (MAXZIDE -25) 37.5-25 MG tablet Take 1 tablet by mouth daily. 10/19/22   Plotnikov, Oakley Bellman, MD  trimethoprim  (TRIMPEX ) 100 MG tablet Take 100 mg by mouth at bedtime. 03/15/21   [provider]  valACYclovir  (VALTREX ) 500 MG tablet TAKE 1 TABLET BY MOUTH EVERYDAY AT BEDTIME 10/19/22   Plotnikov, Aleksei V, MD  vitamin B-12 (CYANOCOBALAMIN ) 1000 MCG tablet Take 2,000 mcg by mouth daily.    [provider]  zinc  gluconate 50 MG tablet Take 50 mg by mouth daily.    [provider]      Allergies     Augmentin  [amoxicillin -pot clavulanate], Bactrim  [sulfamethoxazole -trimethoprim ], Cefdinir , Ciprofloxacin , and Flagyl  [metronidazole ]    Review of Systems   Review of Systems  Cardiovascular:  Positive for chest pain, leg swelling and near-syncope.  All other systems reviewed and are negative.   Physical Exam Updated Vital Signs BP 124/76   Pulse 69   Resp 16   LMP 06/05/2008   SpO2 99%  Physical Exam Vitals and nursing note reviewed.  Constitutional:      General: She is not in acute distress.    Appearance: She is well-developed. She is not toxic-appearing.  HENT:     Head: Normocephalic and atraumatic.  Eyes:     Conjunctiva/sclera: Conjunctivae normal.  Cardiovascular:     Rate and Rhythm: Normal rate and regular rhythm.     Heart sounds: No murmur heard. Pulmonary:     Effort: Pulmonary effort is normal. No respiratory distress.     Breath sounds: Normal breath sounds.     Comments: Lung sounds clear bilaterally without wheezes, rales or rhonchi Abdominal:     Palpations: Abdomen is soft.     Tenderness: There is no abdominal tenderness.  Musculoskeletal:        General: No swelling.     Cervical back: Neck supple.     Comments: Minimal 1+ nonpitting edema to bilateral lower extremities.  No overlying skin change, erythema.  Skin:    General: Skin is warm and dry.     Capillary Refill: Capillary refill takes less than 2 seconds.     Coloration: Skin is not jaundiced or pale.  Neurological:     Mental Status: She is alert and oriented to person, place, and time.  Psychiatric:        Mood and Affect: Mood normal.        Behavior: Behavior normal.     ED Results / Procedures / Treatments   Labs (all labs ordered are listed, but only abnormal results are displayed) Labs Reviewed  BASIC METABOLIC PANEL WITH GFR - Abnormal; Notable for the following components:      Result Value   Glucose, Bld 104 (*)    All other components within normal limits  CBC  PRO  BRAIN NATRIURETIC PEPTIDE  TROPONIN T, HIGH SENSITIVITY  TROPONIN T, HIGH SENSITIVITY    EKG EKG Interpretation Date/Time:  Thursday September 27 2023 11:30:03 EDT Ventricular Rate:  66 PR Interval:  170 QRS Duration:  80 QT Interval:  398 QTC Calculation: 417 R Axis:   76  Text Interpretation: Normal sinus rhythm Low voltage QRS Cannot rule out Anterior infarct , age undetermined Abnormal ECG When compared with ECG of 20-Jun-2021 12:44, No significant change since last tracing Confirmed by Scarlette Currier ([id number]) on 09/27/2023 2:43:54 PM  Radiology DG Chest 2 View Result Date: 09/27/2023 CLINICAL DATA:  Chest pain EXAM: CHEST - 2 VIEW COMPARISON:  X-ray 08/26/2021. FINDINGS: No consolidation, pneumothorax or effusion. No edema. Mild peribronchial thickening. Normal cardiopericardial silhouette. Calcified aorta. Mild degenerative changes along the spine. IMPRESSION: Mild peribronchial thickening. Electronically Signed   By: Adrianna Horde M.D.   On: 09/27/2023 13:12    Procedures Procedures   Medications Ordered in ED Medications - No data to display  ED Course/ Medical Decision Making/ A&P  Medical Decision Making Amount and/or Complexity of Data Reviewed Labs: ordered. Radiology: ordered.   63 year old female presents for evaluation.  Please see HPI for further details.  On examination the patient is afebrile and nontachycardic.  Her lung sounds are clear bilaterally, she is not hypoxic.  Abdomen soft and compressible.  Neurological examinations baseline.  Patient has minimal 1+ nonpitting edema to bilateral lower extremities.  Overall nontoxic in appearance with reassuring vital signs, blood pressure 124/76.  Will assess patient utilizing CBC, BMP, proBNP, troponin x 2, EKG and chest x-ray.  Patient CBC without leukocytosis or anemia.  Metabolic panel without electrolyte derangement.  proBNP not elevated at 292.  Troponins less than 15 x 2.  EKG is nonischemic.  Chest x-ray  unremarkable besides mild peribronchial thickening.  At this time, patient worked unremarkable.  Patient reports chest tenderness, "pinch" has decreased.  She states her leg swelling is also decreased.  Her blood pressure is currently 124/76.  At this time, unsure if patient amlodipine  causing leg swelling versus fluid retention.  Her BNP is not elevated and she has no pleural effusions on chest x-ray so will not start her on any kind of diuretic today.  Have advised her to continue taking 5 mg amlodipine  and follow-up with her PCP Dr. Georgia Kipper.  Have advised her to continue monitoring blood pressure at home.  Have given her return precautions such as chest pain, shortness of breath, headache or blurred vision and she voices understanding.  She had all of her questions answered to her satisfaction.  Stable to discharge.   Final Clinical Impression(s) / ED Diagnoses Final diagnoses:  Chest pain, unspecified type  Leg swelling    Rx / DC Orders ED Discharge Orders     None         Adel Aden, PA-C 09/27/23 1556    Scarlette Currier, MD 09/27/23 2222

## 2023-09-27 NOTE — Discharge Instructions (Signed)
 It was a pleasure taking part in your care.  As discussed, your Is reassuring.  Please follow-up with Dr. Georgia Kipper your early convenience.  Please continue taking 5 mg of lidocaine  until seen by Dr. Georgia Kipper.  Please continue to monitor blood pressure at home and recording it 3 times a day with blood pressure cuff.  If you develop chest pain, shortness of breath, headache or blurred vision in the setting of hypertension please return to ED.

## 2023-09-27 NOTE — Telephone Encounter (Signed)
 Please see nurse triage encounter for today.

## 2023-09-27 NOTE — Telephone Encounter (Signed)
  Chief Complaint: lower leg swelling, medication reaction Symptoms: bilateral moderate lower leg swelling from calves to ankles Frequency: x 1 week, worsens throughout the day and after taking BP medication Pertinent Negatives: Patient denies SOB, chest pain, redness/warmth, fever Disposition: [] ED /[] Urgent Care (no appt availability in office) / [x] Appointment(In office/virtual)/ []  [place]/ [] Home Care/ [x] Refused Recommended Disposition /[] [place] [facility]/ []  Follow-up with PCP Additional Notes: Patient called in yesterday regarding symptoms, calling back today as she has not gotten a response from the clinic. She states the swelling worsens by mid morning after taking her blood pressure medication. Advised she come in for a visit to address concern with PCP and for him to assess the swelling, "I don't have time to go into the doctor's office today". Patient asking if Dr Georgia Kipper will change her medication.  Reason for Disposition  Prescription request for new medicine (not a refill)  Answer Assessment - Initial Assessment Questions 1. NAME of MEDICINE: "What medicine(s) are you calling about?"     Amlodipine .  2. QUESTION: "What is your question?" (e.g., double dose of medicine, side effect)     Patient would like to know if she can switch to a different medication due ot the amlodipine  has caused the leg swelling.  3. PRESCRIBER: "Who prescribed the medicine?" Reason: if prescribed by specialist, call should be referred to that group.     Plotnikov.  4. SYMPTOMS: "Do you have any symptoms?" If Yes, ask: "What symptoms are you having?"  "How bad are the symptoms (e.g., mild, moderate, severe)     Bilateral leg swelling, started in ankles and has moved up calves.  5. PREGNANCY:  "Is there any chance that you are pregnant?" "When was your last menstrual period?"     N/A.  Protocols used: Medication Question Call-A-AH

## 2023-09-27 NOTE — ED Triage Notes (Signed)
 Pt POV fast pace- reports changing bp meds recently, c/o new swelling in legs/ankles x1 week. .  C/o chest pain since yesterday AM and mild L arm tingling, new onset feeling faint x1 hour. Reports good po intake.

## 2023-09-27 NOTE — Telephone Encounter (Signed)
 Chief Complaint: Chest tightness  Symptoms: light headed, "discomfort more than pain" 7/10 discomfort feeling Frequency: Onset yesterday Pertinent Negatives: Patient denies other symptoms Disposition: [x] ED /[] Urgent Care (no appt availability in office) / [] Appointment(In office/virtual)/ []  [facility]/ [] Home Care/ [] Refused Recommended Disposition /[] [place] [facility]/ []  Follow-up with PCP Additional Notes: Patient says since yesterday she's been having chest tightness/discomfort to the left side chest above the left breast, non-radiating. She says it's not painful, she's at work and wonders if she should go to the ED. She says she did not take her BP medication because she started swelling and she wants to know if Dr. Georgia Kipper is going to change it. I advised due to the chest discomfort since yesterday that she will need to go to the ED, she agreed and says she's ok to drive, she's leaving work now to go.   Copied from CRM [id number]. Topic: Clinical - Red Word Triage >> Sep 27, 2023 10:29 AM Keitha Pata L wrote: [facility] that prompted transfer to Nurse Triage: pressure is 148/92 Nathargic, chest tightening, light headed Reason for Disposition  Dizziness or lightheadedness  Answer Assessment - Initial Assessment Questions 1. LOCATION: "Where does it hurt?"       The the left above breast 2. RADIATION: "Does the pain go anywhere else?" (e.g., into neck, jaw, arms, back)     No 3. ONSET: "When did the chest pain begin?" (Minutes, hours or days)      Yesterday 4. PATTERN: "Does the pain come and go, or has it been constant since it started?"  "Does it get worse with exertion?"      Worse with exertion yesterday while planting flowers. Today it's constant just sitting 5. DURATION: "How long does it last" (e.g., seconds, minutes, hours)     All morning 6. SEVERITY: "How bad is the pain?"  (e.g., Scale 1-10; mild, moderate, or severe)    - MILD (1-3): doesn't interfere with  normal activities     - MODERATE (4-7): interferes with normal activities or awakens from sleep    - SEVERE (8-10): excruciating pain, unable to do any normal activities       7 7. CARDIAC RISK FACTORS: "Do you have any history of heart problems or risk factors for heart disease?" (e.g., angina, prior heart attack; diabetes, high blood pressure, high cholesterol, smoker, or strong family history of heart disease)     Yes 8. CAUSE: "What do you think is causing the chest pain?"     I don't know 10. OTHER SYMPTOMS: "Do you have any other symptoms?" (e.g., dizziness, nausea, vomiting, sweating, fever, difficulty breathing, cough)      Lightheaded  Protocols used: Chest Pain-A-AH

## 2023-09-27 NOTE — Telephone Encounter (Signed)
 Pt states she started taking amlodipine  yesterday and reports its making her legs and ankle swollen. Patient wants to know if the medication can be changed to a different medication.

## 2023-09-27 NOTE — Telephone Encounter (Signed)
 Duplicate message, routed to PCP and DOD

## 2023-09-28 ENCOUNTER — Telehealth: Payer: Self-pay | Admitting: Internal Medicine

## 2023-09-28 NOTE — Telephone Encounter (Signed)
 Copied from CRM [id number]. Topic: Clinical - Medication Question >> Sep 28, 2023 10:58 AM [name] wrote: Reason for CRM: Patient wanted to inform PCP she started taking triamterene -hydrochlorothiazide  (MAXZIDE -25) 37.5-25 MG tablet again and has paused taking the newest prescribed high blood pressure medication because it is making her legs swell and causing her blood pressure to go up; patient has appointment scheduled for 10/01/2023 at 11a

## 2023-09-30 NOTE — Patient Instructions (Incomplete)
       Medications changes include :   potassium 20 mEq daily        Return for follow up as scheduled.

## 2023-09-30 NOTE — Progress Notes (Unsigned)
 Subjective:    Patient ID: Tamara Charles, female    DOB: 03/29/1961, 63 y.o.   MRN: [id number]     HPI Laiyah is here for follow up from the hospital   ED 09/27/23 for chest pain, ankle swelling and near syncope  She was on hydrochlorothiazide  x 30 years for BP and edema.  Started having cramping and it was d/c'd and she was placed on amlodipine  5 mg.  Legs swelled after change in medication.  The swelling is causing her to feel off and was causing some emotional distress.    Was having left sided chest pain that she described as a pinch.   It stared while she was in the waiting room of the ED.  There was no radiation.  It was not related to exertion and no SOB.  She also stated headache and lightheadedness.    VSS in ED. EKG stable .  Minimal 1+ pitting edema b/l LE, exam otherwise normal.  Labs normal - cbc, cmp, BNP, trop x 2.  Cxr normal.   No change in BP medication made - advised to continue amlodipine  5 mg daily.  She did subsequently stop amlodipine  and restarted maxzide .   Medications and allergies reviewed with patient and updated if appropriate.  Current Outpatient Medications on File Prior to Visit  Medication Sig Dispense Refill   amLODipine  (NORVASC ) 5 MG tablet Take 1 tablet (5 mg total) by mouth daily. 90 tablet 3   aspirin  EC 81 MG tablet Take 1 tablet (81 mg total) by mouth daily. 100 tablet 3   b complex vitamins tablet Take 1 tablet by mouth daily. 100 tablet 3   calcium carbonate (TUMS - DOSED IN MG ELEMENTAL CALCIUM) 500 MG chewable tablet Chew 1 tablet by mouth 3 (three) times daily as needed for indigestion or heartburn.     Cholecalciferol (VITAMIN D3) 50 MCG (2000 UT) capsule Take 1 capsule (2,000 Units total) by mouth daily. 100 capsule 3   Melatonin 10 MG TABS Take 10 mg by mouth at bedtime.     meloxicam  (MOBIC ) 15 MG tablet Take 1 tablet (15 mg total) by mouth daily as needed for pain. 90 tablet 1   naproxen sodium (ANAPROX) 220 MG tablet Take  220 mg by mouth 2 (two) times daily as needed (pain).     nitrofurantoin , macrocrystal-monohydrate, (MACROBID ) 100 MG capsule Take 1 capsule (100 mg total) by mouth 2 (two) times daily. 14 capsule 0   Probiotic Product (ALIGN) 4 MG CAPS Take 1 capsule (4 mg total) by mouth daily. (Patient taking differently: Take 1 capsule by mouth at bedtime.) 30 capsule 1   triamterene -hydrochlorothiazide  (MAXZIDE -25) 37.5-25 MG tablet Take 1 tablet by mouth daily. 90 tablet 3   trimethoprim  (TRIMPEX ) 100 MG tablet Take 100 mg by mouth at bedtime.     valACYclovir  (VALTREX ) 500 MG tablet TAKE 1 TABLET BY MOUTH EVERYDAY AT BEDTIME 90 tablet 3   vitamin B-12 (CYANOCOBALAMIN ) 1000 MCG tablet Take 2,000 mcg by mouth daily.     zinc  gluconate 50 MG tablet Take 50 mg by mouth daily.     No current facility-administered medications on file prior to visit.     Review of Systems     Objective:  There were no vitals filed for this visit. BP Readings from Last 3 Encounters:  09/27/23 124/76  08/30/23 138/82  10/19/22 110/72   Wt Readings from Last 3 Encounters:  08/30/23 168 lb 9.6 oz (76.5 kg)  10/19/22 165 lb (74.8 kg)  08/26/21 180 lb (81.6 kg)   There is no height or weight on file to calculate BMI.    Physical Exam     Lab Results  Component Value Date   WBC 6.0 09/27/2023   HGB 13.9 09/27/2023   HCT 42.0 09/27/2023   PLT 305 09/27/2023   GLUCOSE 104 (H) 09/27/2023   CHOL 195 08/30/2023   TRIG 271.0 (H) 08/30/2023   HDL 44.30 08/30/2023   LDLDIRECT 151.0 10/19/2022   LDLCALC 96 08/30/2023   ALT 18 08/30/2023   AST 20 08/30/2023   NA 141 09/27/2023   K 4.1 09/27/2023   CL 105 09/27/2023   CREATININE 0.73 09/27/2023   BUN 14 09/27/2023   CO2 23 09/27/2023   TSH 1.23 08/30/2023   HGBA1C 5.6 06/20/2021     Assessment & Plan:    See Problem List for Assessment and Plan of chronic medical problems.

## 2023-10-01 ENCOUNTER — Encounter: Payer: Self-pay | Admitting: Internal Medicine

## 2023-10-01 ENCOUNTER — Ambulatory Visit (INDEPENDENT_AMBULATORY_CARE_PROVIDER_SITE_OTHER): Admitting: Internal Medicine

## 2023-10-01 VITALS — BP 128/90 | HR 60 | Temp 98.2°F | Ht 66.0 in | Wt 160.0 lb

## 2023-10-01 DIAGNOSIS — I10 Essential (primary) hypertension: Secondary | ICD-10-CM

## 2023-10-01 DIAGNOSIS — R600 Localized edema: Secondary | ICD-10-CM

## 2023-10-01 DIAGNOSIS — R252 Cramp and spasm: Secondary | ICD-10-CM | POA: Diagnosis not present

## 2023-10-01 MED ORDER — POTASSIUM CHLORIDE CRYS ER 20 MEQ PO TBCR
20.0000 meq | EXTENDED_RELEASE_TABLET | Freq: Every day | ORAL | 3 refills | Status: DC
Start: 2023-10-01 — End: 2023-12-24

## 2023-10-01 MED ORDER — LOSARTAN POTASSIUM 100 MG PO TABS
100.0000 mg | ORAL_TABLET | Freq: Every day | ORAL | 3 refills | Status: DC
Start: 2023-10-01 — End: 2023-10-31

## 2023-10-01 NOTE — Telephone Encounter (Signed)
 Attempted to reach patient.  Patient has an appt with Dr.Burns today -- will route to her nurse just for an FYI.

## 2023-10-01 NOTE — Telephone Encounter (Signed)
 Discontinue amlodipine  Start losartan 100 mg daily The ankle swelling should improve or resolve in about a week. Thank you

## 2023-10-01 NOTE — Addendum Note (Signed)
 Addended by: Markis Langland V on: 10/01/2023 07:44 AM   Modules accepted: Orders

## 2023-10-25 ENCOUNTER — Other Ambulatory Visit: Payer: Self-pay | Admitting: Internal Medicine

## 2023-10-31 ENCOUNTER — Encounter: Payer: Self-pay | Admitting: Internal Medicine

## 2023-10-31 ENCOUNTER — Ambulatory Visit (INDEPENDENT_AMBULATORY_CARE_PROVIDER_SITE_OTHER): Admitting: Internal Medicine

## 2023-10-31 VITALS — BP 122/82 | HR 86 | Temp 98.3°F | Ht 66.0 in | Wt 164.0 lb

## 2023-10-31 DIAGNOSIS — Z0000 Encounter for general adult medical examination without abnormal findings: Secondary | ICD-10-CM

## 2023-10-31 DIAGNOSIS — E559 Vitamin D deficiency, unspecified: Secondary | ICD-10-CM

## 2023-10-31 DIAGNOSIS — I10 Essential (primary) hypertension: Secondary | ICD-10-CM

## 2023-10-31 DIAGNOSIS — R252 Cramp and spasm: Secondary | ICD-10-CM

## 2023-10-31 DIAGNOSIS — R5382 Chronic fatigue, unspecified: Secondary | ICD-10-CM | POA: Diagnosis not present

## 2023-10-31 DIAGNOSIS — M79671 Pain in right foot: Secondary | ICD-10-CM | POA: Diagnosis not present

## 2023-10-31 DIAGNOSIS — M79672 Pain in left foot: Secondary | ICD-10-CM

## 2023-10-31 MED ORDER — VALACYCLOVIR HCL 500 MG PO TABS: ORAL_TABLET | ORAL | 3 refills | Status: AC

## 2023-10-31 NOTE — Assessment & Plan Note (Signed)
 Better

## 2023-10-31 NOTE — Assessment & Plan Note (Addendum)
 Finn shoes Hydrochlorothiazide  and KCl Cramps resolved w/KCL and HCTZ and new shoes - [employer]

## 2023-10-31 NOTE — Assessment & Plan Note (Signed)
 D/c'd Norvasc  Hydrochlorothiazide  and KCl

## 2023-10-31 NOTE — Progress Notes (Signed)
 Subjective:  Patient ID: Tamara Charles, female    DOB: 30-Apr-1961  Age: 63 y.o. MRN: [id number]  CC: Medical Management of Chronic Issues (2 mnth f/u)   HPI  Tamara Charles presents for HTN, amlodipine  caused swelling Cramps resolved w/KCL and HCTZ and new shoes - [employer]   Outpatient Medications Prior to Visit  Medication Sig Dispense Refill   aspirin  EC 81 MG tablet Take 1 tablet (81 mg total) by mouth daily. 100 tablet 3   b complex vitamins tablet Take 1 tablet by mouth daily. 100 tablet 3   calcium carbonate (TUMS - DOSED IN MG ELEMENTAL CALCIUM) 500 MG chewable tablet Chew 1 tablet by mouth 3 (three) times daily as needed for indigestion or heartburn.     Cholecalciferol (VITAMIN D3) 50 MCG (2000 UT) capsule Take 1 capsule (2,000 Units total) by mouth daily. 100 capsule 3   Melatonin 10 MG TABS Take 10 mg by mouth at bedtime.     meloxicam  (MOBIC ) 15 MG tablet Take 1 tablet (15 mg total) by mouth daily as needed for pain. 90 tablet 1   naproxen sodium (ANAPROX) 220 MG tablet Take 220 mg by mouth 2 (two) times daily as needed (pain).     nitrofurantoin , macrocrystal-monohydrate, (MACROBID ) 100 MG capsule Take 1 capsule (100 mg total) by mouth 2 (two) times daily. 14 capsule 0   potassium chloride  SA (KLOR-CON  M) 20 MEQ tablet Take 1 tablet (20 mEq total) by mouth daily. 30 tablet 3   Probiotic Product (ALIGN) 4 MG CAPS Take 1 capsule (4 mg total) by mouth daily. (Patient taking differently: Take 1 capsule by mouth at bedtime.) 30 capsule 1   triamterene -hydrochlorothiazide  (MAXZIDE -25) 37.5-25 MG tablet TAKE 1 TABLET BY MOUTH EVERY DAY 90 tablet 3   trimethoprim  (TRIMPEX ) 100 MG tablet Take 100 mg by mouth at bedtime.     vitamin B-12 (CYANOCOBALAMIN ) 1000 MCG tablet Take 2,000 mcg by mouth daily.     zinc  gluconate 50 MG tablet Take 50 mg by mouth daily.     valACYclovir  (VALTREX ) 500 MG tablet TAKE 1 TABLET BY MOUTH EVERYDAY AT BEDTIME 90 tablet 3   losartan  (COZAAR ) 100 MG  tablet Take 1 tablet (100 mg total) by mouth daily. (Patient not taking: Reported on 10/31/2023) 90 tablet 3   No facility-administered medications prior to visit.    ROS: Review of Systems  Constitutional:  Negative for activity change, appetite change, chills, fatigue and unexpected weight change.  HENT:  Negative for congestion, mouth sores and sinus pressure.   Eyes:  Negative for visual disturbance.  Respiratory:  Negative for cough and chest tightness.   Gastrointestinal:  Negative for abdominal pain and nausea.  Genitourinary:  Negative for difficulty urinating, frequency and vaginal pain.  Musculoskeletal:  Negative for back pain and gait problem.  Skin:  Negative for pallor and rash.  Neurological:  Negative for dizziness, tremors, weakness, numbness and headaches.  Psychiatric/Behavioral:  Negative for confusion, sleep disturbance and suicidal ideas.     Objective:  BP 122/82   Pulse 86   Temp 98.3 F (36.8 C) (Oral)   Ht 5\' 6"  (1.676 m)   Wt 164 lb (74.4 kg)   LMP 06/05/2008   SpO2 94%   BMI 26.47 kg/m   BP Readings from Last 3 Encounters:  10/31/23 122/82  10/01/23 (!) 128/90  09/27/23 124/76    Wt Readings from Last 3 Encounters:  10/31/23 164 lb (74.4 kg)  10/01/23 160 lb (72.6 kg)  08/30/23 168 lb 9.6 oz (76.5 kg)    Physical Exam Constitutional:      General: She is not in acute distress.    Appearance: She is well-developed.  HENT:     Head: Normocephalic.     Right Ear: External ear normal.     Left Ear: External ear normal.     Nose: Nose normal.  Eyes:     General:        Right eye: No discharge.        Left eye: No discharge.     Conjunctiva/sclera: Conjunctivae normal.     Pupils: Pupils are equal, round, and reactive to light.  Neck:     Thyroid : No thyromegaly.     Vascular: No JVD.     Trachea: No tracheal deviation.  Cardiovascular:     Rate and Rhythm: Normal rate and regular rhythm.     Heart sounds: Normal heart sounds.   Pulmonary:     Effort: No respiratory distress.     Breath sounds: No stridor. No wheezing.  Abdominal:     General: Bowel sounds are normal. There is no distension.     Palpations: Abdomen is soft. There is no mass.     Tenderness: There is no abdominal tenderness. There is no guarding or rebound.  Musculoskeletal:        General: No tenderness.     Cervical back: Normal range of motion and neck supple. No rigidity.  Lymphadenopathy:     Cervical: No cervical adenopathy.  Skin:    Findings: No erythema or rash.  Neurological:     Mental Status: She is oriented to person, place, and time.     Cranial Nerves: No cranial nerve deficit.     Motor: No abnormal muscle tone.     Coordination: Coordination normal.     Deep Tendon Reflexes: Reflexes normal.  Psychiatric:        Behavior: Behavior normal.        Thought Content: Thought content normal.        Judgment: Judgment normal.     Lab Results  Component Value Date   WBC 6.0 09/27/2023   HGB 13.9 09/27/2023   HCT 42.0 09/27/2023   PLT 305 09/27/2023   GLUCOSE 104 (H) 09/27/2023   CHOL 195 08/30/2023   TRIG 271.0 (H) 08/30/2023   HDL 44.30 08/30/2023   LDLDIRECT 151.0 10/19/2022   LDLCALC 96 08/30/2023   ALT 18 08/30/2023   AST 20 08/30/2023   NA 141 09/27/2023   K 4.1 09/27/2023   CL 105 09/27/2023   CREATININE 0.73 09/27/2023   BUN 14 09/27/2023   CO2 23 09/27/2023   TSH 1.23 08/30/2023   HGBA1C 5.6 06/20/2021    DG Chest 2 View Result Date: 09/27/2023 CLINICAL DATA:  Chest pain EXAM: CHEST - 2 VIEW COMPARISON:  X-ray 08/26/2021. FINDINGS: No consolidation, pneumothorax or effusion. No edema. Mild peribronchial thickening. Normal cardiopericardial silhouette. Calcified aorta. Mild degenerative changes along the spine. IMPRESSION: Mild peribronchial thickening. Electronically Signed   By: Adrianna Horde M.D.   On: 09/27/2023 13:12    Assessment & Plan:   Problem List Items Addressed This Visit      Hypertension   D/c'd Norvasc  Hydrochlorothiazide  and KCl      Well adult exam   Relevant Orders   TSH   Urinalysis   CBC with Differential/Platelet   Lipid panel   Comprehensive metabolic panel with GFR   Foot pain, bilateral  [place] shoes Hydrochlorothiazide  and KCl Cramps resolved w/KCL and HCTZ and new shoes - [place]      Vitamin D  deficiency   On Vit D      Chronic fatigue   Better      Relevant Orders   TSH   Urinalysis   CBC with Differential/Platelet   Lipid panel   Comprehensive metabolic panel with GFR   Magnesium    Leg cramps - Primary   [place] shoes Hydrochlorothiazide  and KCl Cramps resolved w/KCL and HCTZ and new shoes - [place]      Relevant Orders   TSH   Urinalysis   CBC with Differential/Platelet   Lipid panel   Comprehensive metabolic panel with GFR   Magnesium       Meds ordered this encounter  Medications   valACYclovir  (VALTREX ) 500 MG tablet    Sig: TAKE 1 TABLET BY MOUTH EVERYDAY AT BEDTIME    Dispense:  90 tablet    Refill:  3      Follow-up: Return in about 6 months (around 05/02/2024) for Wellness Exam.  Anitra Barn, MD

## 2023-10-31 NOTE — Assessment & Plan Note (Signed)
 On Vit D

## 2023-12-13 ENCOUNTER — Encounter: Payer: Self-pay | Admitting: Internal Medicine

## 2023-12-13 ENCOUNTER — Ambulatory Visit: Admitting: Internal Medicine

## 2023-12-13 VITALS — BP 110/70 | HR 65 | Temp 98.6°F | Ht 66.0 in | Wt 165.0 lb

## 2023-12-13 DIAGNOSIS — R252 Cramp and spasm: Secondary | ICD-10-CM

## 2023-12-13 DIAGNOSIS — Z8249 Family history of ischemic heart disease and other diseases of the circulatory system: Secondary | ICD-10-CM

## 2023-12-13 DIAGNOSIS — F17200 Nicotine dependence, unspecified, uncomplicated: Secondary | ICD-10-CM | POA: Diagnosis not present

## 2023-12-13 DIAGNOSIS — I10 Essential (primary) hypertension: Secondary | ICD-10-CM | POA: Diagnosis not present

## 2023-12-13 MED ORDER — ZEPBOUND 2.5 MG/0.5ML ~~LOC~~ SOAJ: 2.5000 mg | SUBCUTANEOUS | 3 refills | Status: AC

## 2023-12-13 MED ORDER — OLMESARTAN MEDOXOMIL 20 MG PO TABS: 20.0000 mg | ORAL_TABLET | Freq: Every day | ORAL | 11 refills | Status: DC

## 2023-12-13 MED ORDER — HYDROCHLOROTHIAZIDE 12.5 MG PO CAPS: 12.5000 mg | ORAL_CAPSULE | Freq: Every day | ORAL | 11 refills | Status: DC

## 2023-12-13 NOTE — Patient Instructions (Signed)
 Stop HCTZ/Triamt due to cramps Start Olmesartan  20 mg/day Start hydrochlorothiazide  in 1 week or so

## 2023-12-13 NOTE — Assessment & Plan Note (Addendum)
 D/c HCTZ/Triamt due to cramps Start Olmesartan  20 mg/d Start hydrochlorothiazide  in 1 week or so Brother died of MI.  Coronary calcium CT was offered.  Tamara Charles should stop smoking.  Take baby aspirin  daily.  Not interested in statins

## 2023-12-13 NOTE — Assessment & Plan Note (Signed)
 Relapsed D/c HCTZ/Triamt due to cramps Start Olmesartan  20 mg/d Start hydrochlorothiazide  in 1 week or so

## 2023-12-13 NOTE — Progress Notes (Addendum)
 Subjective:  Patient ID: Tamara Charles, female    DOB: August 05, 1960  Age: 63 y.o. MRN: [id number]  CC: Medical Management of Chronic Issues (Blood Pressure med side effect concerns with leg cramping )   HPI Tamara Charles presents for HTN and leg cramps are back -they are quite severe.  Outpatient Medications Prior to Visit  Medication Sig Dispense Refill   aspirin  EC 81 MG tablet Take 1 tablet (81 mg total) by mouth daily. 100 tablet 3   b complex vitamins tablet Take 1 tablet by mouth daily. 100 tablet 3   calcium carbonate (TUMS - DOSED IN MG ELEMENTAL CALCIUM) 500 MG chewable tablet Chew 1 tablet by mouth 3 (three) times daily as needed for indigestion or heartburn.     Cholecalciferol (VITAMIN D3) 50 MCG (2000 UT) capsule Take 1 capsule (2,000 Units total) by mouth daily. 100 capsule 3   Melatonin 10 MG TABS Take 10 mg by mouth at bedtime.     naproxen sodium (ANAPROX) 220 MG tablet Take 220 mg by mouth 2 (two) times daily as needed (pain).     nitrofurantoin , macrocrystal-monohydrate, (MACROBID ) 100 MG capsule Take 1 capsule (100 mg total) by mouth 2 (two) times daily. 14 capsule 0   potassium chloride  SA (KLOR-CON  M) 20 MEQ tablet Take 1 tablet (20 mEq total) by mouth daily. 30 tablet 3   Probiotic Product (ALIGN) 4 MG CAPS Take 1 capsule (4 mg total) by mouth daily. (Patient taking differently: Take 1 capsule by mouth at bedtime.) 30 capsule 1   trimethoprim  (TRIMPEX ) 100 MG tablet Take 100 mg by mouth at bedtime.     valACYclovir  (VALTREX ) 500 MG tablet TAKE 1 TABLET BY MOUTH EVERYDAY AT BEDTIME 90 tablet 3   vitamin B-12 (CYANOCOBALAMIN ) 1000 MCG tablet Take 2,000 mcg by mouth daily.     zinc  gluconate 50 MG tablet Take 50 mg by mouth daily.     meloxicam  (MOBIC ) 15 MG tablet Take 1 tablet (15 mg total) by mouth daily as needed for pain. 90 tablet 1   triamterene -hydrochlorothiazide  (MAXZIDE -25) 37.5-25 MG tablet TAKE 1 TABLET BY MOUTH EVERY DAY 90 tablet 3   No  facility-administered medications prior to visit.    ROS: Review of Systems  Constitutional:  Negative for activity change, appetite change, chills, fatigue and unexpected weight change.  HENT:  Negative for congestion, mouth sores and sinus pressure.   Eyes:  Negative for visual disturbance.  Respiratory:  Negative for cough and chest tightness.   Gastrointestinal:  Negative for abdominal pain and nausea.  Genitourinary:  Negative for difficulty urinating, frequency and vaginal pain.  Musculoskeletal:  Positive for myalgias. Negative for back pain and gait problem.  Skin:  Negative for pallor and rash.  Neurological:  Negative for dizziness, tremors, weakness, numbness and headaches.  Hematological:  Does not bruise/bleed easily.  Psychiatric/Behavioral:  Negative for confusion and sleep disturbance.     Objective:  BP 110/70   Pulse 65   Temp 98.6 F (37 C) (Oral)   Ht 5' 6 (1.676 m)   Wt 165 lb (74.8 kg)   LMP 06/05/2008   SpO2 96%   BMI 26.63 kg/m   BP Readings from Last 3 Encounters:  12/13/23 110/70  10/31/23 122/82  10/01/23 (!) 128/90    Wt Readings from Last 3 Encounters:  12/13/23 165 lb (74.8 kg)  10/31/23 164 lb (74.4 kg)  10/01/23 160 lb (72.6 kg)    Physical Exam Constitutional:  General: She is not in acute distress.    Appearance: She is well-developed.  HENT:     Head: Normocephalic.     Right Ear: External ear normal.     Left Ear: External ear normal.     Nose: Nose normal.  Eyes:     General:        Right eye: No discharge.        Left eye: No discharge.     Conjunctiva/sclera: Conjunctivae normal.     Pupils: Pupils are equal, round, and reactive to light.  Neck:     Thyroid : No thyromegaly.     Vascular: No JVD.     Trachea: No tracheal deviation.  Cardiovascular:     Rate and Rhythm: Normal rate and regular rhythm.     Heart sounds: Normal heart sounds.  Pulmonary:     Effort: No respiratory distress.     Breath sounds: No  stridor. No wheezing.  Abdominal:     General: Bowel sounds are normal. There is no distension.     Palpations: Abdomen is soft. There is no mass.     Tenderness: There is no abdominal tenderness. There is no guarding or rebound.  Musculoskeletal:        General: No tenderness.     Cervical back: Normal range of motion and neck supple. No rigidity.     Right lower leg: No edema.     Left lower leg: No edema.  Lymphadenopathy:     Cervical: No cervical adenopathy.  Skin:    Findings: No erythema or rash.  Neurological:     Mental Status: She is oriented to person, place, and time.     Cranial Nerves: No cranial nerve deficit.     Motor: No abnormal muscle tone.     Coordination: Coordination normal.     Deep Tendon Reflexes: Reflexes normal.  Psychiatric:        Behavior: Behavior normal.        Thought Content: Thought content normal.        Judgment: Judgment normal.     Lab Results  Component Value Date   WBC 6.0 09/27/2023   HGB 13.9 09/27/2023   HCT 42.0 09/27/2023   PLT 305 09/27/2023   GLUCOSE 104 (H) 09/27/2023   CHOL 195 08/30/2023   TRIG 271.0 (H) 08/30/2023   HDL 44.30 08/30/2023   LDLDIRECT 151.0 10/19/2022   LDLCALC 96 08/30/2023   ALT 18 08/30/2023   AST 20 08/30/2023   NA 141 09/27/2023   K 4.1 09/27/2023   CL 105 09/27/2023   CREATININE 0.73 09/27/2023   BUN 14 09/27/2023   CO2 23 09/27/2023   TSH 1.23 08/30/2023   HGBA1C 5.6 06/20/2021    DG Chest 2 View Result Date: 09/27/2023 CLINICAL DATA:  Chest pain EXAM: CHEST - 2 VIEW COMPARISON:  X-ray 08/26/2021. FINDINGS: No consolidation, pneumothorax or effusion. No edema. Mild peribronchial thickening. Normal cardiopericardial silhouette. Calcified aorta. Mild degenerative changes along the spine. IMPRESSION: Mild peribronchial thickening. Electronically Signed   By: Ranell Bring M.D.   On: 09/27/2023 13:12    Assessment & Plan:   Problem List Items Addressed This Visit     Hypertension - Primary    D/c HCTZ/Triamt due to cramps Start Olmesartan  20 mg/d Start hydrochlorothiazide  in 1 week or so Brother died of MI.  Coronary calcium CT was offered.  Mitchell should stop smoking.  Take baby aspirin  daily.  Not interested in statins  Relevant Medications   hydrochlorothiazide  (MICROZIDE ) 12.5 MG capsule   olmesartan  (BENICAR ) 20 MG tablet   Family history of early CAD   Brother died of MI.  Coronary calcium CT was offered.  Morrisa should stop smoking.  Take baby aspirin  daily.  Not interested in statins      Tobacco use disorder   Brother died of MI.  Coronary calcium CT was offered.  Katharina should stop smoking.  Take baby aspirin  daily.  Not interested in statins      Leg cramps   Relapsed D/c HCTZ/Triamt due to cramps Start Olmesartan  20 mg/d Start hydrochlorothiazide  in 1 week or so         Meds ordered this encounter  Medications   hydrochlorothiazide  (MICROZIDE ) 12.5 MG capsule    Sig: Take 1 capsule (12.5 mg total) by mouth daily.    Dispense:  30 capsule    Refill:  11   olmesartan  (BENICAR ) 20 MG tablet    Sig: Take 1 tablet (20 mg total) by mouth daily.    Dispense:  30 tablet    Refill:  11   tirzepatide  (ZEPBOUND ) 2.5 MG/0.5ML Pen    Sig: Inject 2.5 mg into the skin once a week.    Dispense:  2 mL    Refill:  3    BMI 26.6, HTN      Follow-up: Return in about 2 months (around 02/13/2024) for a follow-up visit.  Marolyn Noel, MD

## 2023-12-16 NOTE — Assessment & Plan Note (Signed)
 Brother died of MI.  Coronary calcium CT was offered.  Berkleigh should stop smoking.  Take baby aspirin  daily.  Not interested in statins

## 2023-12-16 NOTE — Assessment & Plan Note (Signed)
 Brother died of MI.  Coronary calcium CT was offered.  Tamara Charles should stop smoking.  Take baby aspirin  daily.  Not interested in statins

## 2023-12-19 ENCOUNTER — Ambulatory Visit: Payer: Self-pay

## 2023-12-19 NOTE — Telephone Encounter (Signed)
 FYI Only or Action Required?: Action required by provider: clinical question for provider and request to change medication.  Patient was last seen in primary care on 12/13/2023 by Plotnikov, Karlynn GAILS, MD.  Called Nurse Triage reporting Chest Pain.  Symptoms began Monday night and Tuesday night.  Interventions attempted: Other: stopped taking new medication.  Symptoms are: completely resolved.  Triage Disposition: See Physician Within 24 Hours  Patient/caregiver understands and will follow disposition?: No, wishes to speak with PCP      Copied from CRM [id number]. Topic: Clinical - Red Word Triage >> Dec 19, 2023  9:41 AM Jasmin G wrote: Red Word that prompted transfer to Nurse Triage: Two medication issues: Pt. started taking new bp medicine olmesartan  (BENICAR ) 20 MG tablet last week and started having chest pain on Monday. She also got prescribed hydrochlorothiazide  (MICROZIDE ) 12.5 MG capsule and one of the side effects in sun sensitivity but pt. is going on vacation next week where she will be exposed to a lot of sun, so she would like to speak to someone about a plan of action for this. Reason for Disposition  [1] Chest pain lasts > 5 minutes AND [2] occurred > 3 days ago (72 hours) AND [3] NO chest pain or cardiac symptoms now  Answer Assessment - Initial Assessment Questions 1. LOCATION: Where does it hurt?       Center of breast 2. RADIATION: Does the pain go anywhere else? (e.g., into neck, jaw, arms, back)     no 3. ONSET: When did the chest pain begin? (Minutes, hours or days)      Monday and Tuesday 4. PATTERN: Does the pain come and go, or has it been constant since it started?  Does it get worse with exertion?      Comes and goes 5. DURATION: How long does it last (e.g., seconds, minutes, hours)     N/a 6. SEVERITY: How bad is the pain?  (e.g., Scale 1-10; mild, moderate, or severe)     8/10 7. CARDIAC RISK FACTORS: Do you have any history of heart  problems or risk factors for heart disease? (e.g., angina, prior heart attack; diabetes, high blood pressure, high cholesterol, smoker, or strong family history of heart disease)     na 8. PULMONARY RISK FACTORS: Do you have any history of lung disease?  (e.g., blood clots in lung, asthma, emphysema, birth control pills)     na 9. CAUSE: What do you think is causing the chest pain?     New medication- olmesartan  (BENICAR ) 20 MG tablet 10. OTHER SYMPTOMS: Do you have any other symptoms? (e.g., dizziness, nausea, vomiting, sweating, fever, difficulty breathing, cough)       none 11. PREGNANCY: Is there any chance you are pregnant? When was your last menstrual period?       na  Started new medication olmesartan  (BENICAR ) 20 MG tablet Friday - then Monday and Tuesday had chest pain therefore pt stated she is not taking it today/anymore.  Rubbed magnesium  on chest and it helped with pain. No pain today: pt at work and does not want to make an appt: patient would like to switch to a different medication.  Pt also states she is to start hydrochlorothiazide  (MICROZIDE ) 12.5 MG capsule this Friday - however the medication states limit exposure to sun.  Pt will be going to beach this Thursday and plans on laying out the entire time - pt would like to know if this is ok to start on  Friday while at the beach or should she wait until she is back home to start?  Protocols used: Chest Pain-A-AH

## 2023-12-19 NOTE — Telephone Encounter (Signed)
 Copied from CRM [id number]. Topic: Clinical - Red Word Triage >> Dec 19, 2023  9:57 AM Myrick T wrote: [facility] that prompted transfer to Nurse Triage: blood pressure med olmesartan  (BENICAR ) 20 MG tablet waschanged and she started having chest pains between her breast and feeling really tired. She says she doesn't feel like its a heart attack the symptoms happen after taking the med.  See nurse triage encounter

## 2023-12-24 ENCOUNTER — Ambulatory Visit (INDEPENDENT_AMBULATORY_CARE_PROVIDER_SITE_OTHER): Admitting: Internal Medicine

## 2023-12-24 ENCOUNTER — Encounter: Payer: Self-pay | Admitting: Internal Medicine

## 2023-12-24 VITALS — BP 110/62 | HR 65 | Temp 98.2°F | Ht 66.0 in | Wt 168.0 lb

## 2023-12-24 DIAGNOSIS — E785 Hyperlipidemia, unspecified: Secondary | ICD-10-CM | POA: Insufficient documentation

## 2023-12-24 DIAGNOSIS — R252 Cramp and spasm: Secondary | ICD-10-CM | POA: Diagnosis not present

## 2023-12-24 DIAGNOSIS — I10 Essential (primary) hypertension: Secondary | ICD-10-CM | POA: Diagnosis not present

## 2023-12-24 MED ORDER — SPIRONOLACTONE 25 MG PO TABS: 12.5000 mg | ORAL_TABLET | Freq: Every day | ORAL | 3 refills | Status: DC

## 2023-12-24 MED ORDER — METOPROLOL SUCCINATE ER 25 MG PO TB24: 25.0000 mg | ORAL_TABLET | Freq: Every day | ORAL | 3 refills | Status: DC

## 2023-12-24 NOTE — Assessment & Plan Note (Signed)
 No relapse D/c HCTZ/Triamt due to cramps Start Olmesartan  20 mg/d Start hydrochlorothiazide  in 1 week or so

## 2023-12-24 NOTE — Progress Notes (Signed)
 Subjective:  Patient ID: Tamara Charles, female    DOB: 12/20/1960  Age: 63 y.o. MRN: [id number]  CC: Medication Refill (Pts states medication that she was given Olmesartan  caused her to have chest pains and leg cramps)   HPI Alaija Ruble presents for HTN, fluid retention - Olmesartan  caused bad CP per pt  Outpatient Medications Prior to Visit  Medication Sig Dispense Refill   aspirin  EC 81 MG tablet Take 1 tablet (81 mg total) by mouth daily. 100 tablet 3   b complex vitamins tablet Take 1 tablet by mouth daily. 100 tablet 3   calcium carbonate (TUMS - DOSED IN MG ELEMENTAL CALCIUM) 500 MG chewable tablet Chew 1 tablet by mouth 3 (three) times daily as needed for indigestion or heartburn.     Cholecalciferol (VITAMIN D3) 50 MCG (2000 UT) capsule Take 1 capsule (2,000 Units total) by mouth daily. 100 capsule 3   Melatonin 10 MG TABS Take 10 mg by mouth at bedtime.     meloxicam  (MOBIC ) 15 MG tablet Take 15 mg by mouth daily as needed.     naproxen sodium (ANAPROX) 220 MG tablet Take 220 mg by mouth 2 (two) times daily as needed (pain).     nitrofurantoin , macrocrystal-monohydrate, (MACROBID ) 100 MG capsule Take 1 capsule (100 mg total) by mouth 2 (two) times daily. 14 capsule 0   Probiotic Product (ALIGN) 4 MG CAPS Take 1 capsule (4 mg total) by mouth daily. (Patient taking differently: Take 1 capsule by mouth at bedtime.) 30 capsule 1   tirzepatide  (ZEPBOUND ) 2.5 MG/0.5ML Pen Inject 2.5 mg into the skin once a week. 2 mL 3   trimethoprim  (TRIMPEX ) 100 MG tablet Take 100 mg by mouth at bedtime.     valACYclovir  (VALTREX ) 500 MG tablet TAKE 1 TABLET BY MOUTH EVERYDAY AT BEDTIME 90 tablet 3   vitamin B-12 (CYANOCOBALAMIN ) 1000 MCG tablet Take 2,000 mcg by mouth daily.     zinc  gluconate 50 MG tablet Take 50 mg by mouth daily.     hydrochlorothiazide  (MICROZIDE ) 12.5 MG capsule Take 1 capsule (12.5 mg total) by mouth daily. 30 capsule 11   olmesartan  (BENICAR ) 20 MG tablet Take 1  tablet (20 mg total) by mouth daily. 30 tablet 11   potassium chloride  SA (KLOR-CON  M) 20 MEQ tablet Take 1 tablet (20 mEq total) by mouth daily. 30 tablet 3   No facility-administered medications prior to visit.    ROS: Review of Systems  Constitutional:  Negative for activity change, appetite change, chills, fatigue and unexpected weight change.  HENT:  Negative for congestion, mouth sores and sinus pressure.   Eyes:  Negative for visual disturbance.  Respiratory:  Positive for chest tightness. Negative for cough.   Cardiovascular:  Positive for leg swelling.  Gastrointestinal:  Negative for abdominal pain and nausea.  Genitourinary:  Negative for difficulty urinating, frequency and vaginal pain.  Musculoskeletal:  Negative for back pain and gait problem.  Skin:  Negative for pallor and rash.  Neurological:  Negative for dizziness, tremors, weakness, numbness and headaches.  Psychiatric/Behavioral:  Negative for confusion and sleep disturbance.     Objective:  BP 110/62   Pulse 65   Temp 98.2 F (36.8 C) (Oral)   Ht 5' 6 (1.676 m)   Wt 168 lb (76.2 kg)   LMP 06/05/2008   SpO2 96%   BMI 27.12 kg/m   BP Readings from Last 3 Encounters:  12/24/23 110/62  12/13/23 110/70  10/31/23 122/82  Wt Readings from Last 3 Encounters:  12/24/23 168 lb (76.2 kg)  12/13/23 165 lb (74.8 kg)  10/31/23 164 lb (74.4 kg)    Physical Exam Constitutional:      General: She is not in acute distress.    Appearance: She is well-developed.  HENT:     Head: Normocephalic.     Right Ear: External ear normal.     Left Ear: External ear normal.     Nose: Nose normal.  Eyes:     General:        Right eye: No discharge.        Left eye: No discharge.     Conjunctiva/sclera: Conjunctivae normal.     Pupils: Pupils are equal, round, and reactive to light.  Neck:     Thyroid : No thyromegaly.     Vascular: No JVD.     Trachea: No tracheal deviation.  Cardiovascular:     Rate and  Rhythm: Normal rate and regular rhythm.     Heart sounds: Normal heart sounds.  Pulmonary:     Effort: No respiratory distress.     Breath sounds: No stridor. No wheezing.  Abdominal:     General: Bowel sounds are normal. There is no distension.     Palpations: Abdomen is soft. There is no mass.     Tenderness: There is no abdominal tenderness. There is no guarding or rebound.  Musculoskeletal:        General: No tenderness.     Cervical back: Normal range of motion and neck supple. No rigidity.  Lymphadenopathy:     Cervical: No cervical adenopathy.  Skin:    Findings: No erythema or rash.  Neurological:     Cranial Nerves: No cranial nerve deficit.     Motor: No abnormal muscle tone.     Coordination: Coordination normal.     Deep Tendon Reflexes: Reflexes normal.  Psychiatric:        Behavior: Behavior normal.        Thought Content: Thought content normal.        Judgment: Judgment normal.   Chest wall-nontender Legs without edema  Lab Results  Component Value Date   WBC 6.0 09/27/2023   HGB 13.9 09/27/2023   HCT 42.0 09/27/2023   PLT 305 09/27/2023   GLUCOSE 104 (H) 09/27/2023   CHOL 195 08/30/2023   TRIG 271.0 (H) 08/30/2023   HDL 44.30 08/30/2023   LDLDIRECT 151.0 10/19/2022   LDLCALC 96 08/30/2023   ALT 18 08/30/2023   AST 20 08/30/2023   NA 141 09/27/2023   K 4.1 09/27/2023   CL 105 09/27/2023   CREATININE 0.73 09/27/2023   BUN 14 09/27/2023   CO2 23 09/27/2023   TSH 1.23 08/30/2023   HGBA1C 5.6 06/20/2021    DG Chest 2 View Result Date: 09/27/2023 CLINICAL DATA:  Chest pain EXAM: CHEST - 2 VIEW COMPARISON:  X-ray 08/26/2021. FINDINGS: No consolidation, pneumothorax or effusion. No edema. Mild peribronchial thickening. Normal cardiopericardial silhouette. Calcified aorta. Mild degenerative changes along the spine. IMPRESSION: Mild peribronchial thickening. Electronically Signed   By: Ranell Bring M.D.   On: 09/27/2023 13:12    Assessment & Plan:    Problem List Items Addressed This Visit     Dyslipidemia   Ordered coronary calcium CT score test      Relevant Orders   CT CARDIAC SCORING (SELF PAY ONLY)   Hypertension   Olmesartan  caused bad CP per pt - d/c Hydrochlorothiazide  caused  cramps -  d/c Try spironolactone  12.5-25 mg/d Toprol  XR 25 mg a day       Relevant Medications   spironolactone  (ALDACTONE ) 25 MG tablet   metoprolol  succinate (TOPROL -XL) 25 MG 24 hr tablet   Leg cramps - Primary   No relapse D/c HCTZ/Triamt due to cramps Start Olmesartan  20 mg/d Start hydrochlorothiazide  in 1 week or so         Meds ordered this encounter  Medications   spironolactone  (ALDACTONE ) 25 MG tablet    Sig: Take 0.5-1 tablets (12.5-25 mg total) by mouth daily.    Dispense:  90 tablet    Refill:  3   metoprolol  succinate (TOPROL -XL) 25 MG 24 hr tablet    Sig: Take 1 tablet (25 mg total) by mouth daily.    Dispense:  90 tablet    Refill:  3      Follow-up: Return for a follow-up visit.  Marolyn Noel, MD

## 2023-12-24 NOTE — Assessment & Plan Note (Signed)
Ordered coronary calcium CT score test

## 2023-12-24 NOTE — Assessment & Plan Note (Signed)
 Olmesartan  caused bad CP per pt - d/c Hydrochlorothiazide  caused  cramps - d/c Try spironolactone  12.5-25 mg/d Toprol  XR 25 mg a day

## 2023-12-24 NOTE — Telephone Encounter (Signed)
Addressed.  Thanks.  

## 2023-12-26 ENCOUNTER — Telehealth: Payer: Self-pay

## 2023-12-26 NOTE — Telephone Encounter (Signed)
 Pt has called and stated she took 1/2 a tab of her water pill last night and the other half today. Upon checking her P 5 min ago she noticed her BP is 160/88. Pt is wanting to know if she should take a whole water pill or take BP meds.

## 2023-12-27 NOTE — Telephone Encounter (Signed)
 Please continue with both metoprolol  and spironolactone  as prescribed. It will take several days for spironolactone  to start working. Thanks

## 2023-12-28 NOTE — Telephone Encounter (Signed)
 I was able to speak with the pt and inform her of the providers instructions as follows Please continue with both metoprolol  and spironolactone  as prescribed. It will take several days for spironolactone  to start working. Thanks  Pt has stated understanding and will continue on the regime as instructed.

## 2024-01-07 ENCOUNTER — Ambulatory Visit
Admission: RE | Admit: 2024-01-07 | Discharge: 2024-01-07 | Disposition: A | Discharge status: Home / Self Care | Source: Ambulatory Visit | Attending: Internal Medicine | Admitting: Internal Medicine

## 2024-01-07 DIAGNOSIS — E785 Hyperlipidemia, unspecified: Secondary | ICD-10-CM

## 2024-01-09 ENCOUNTER — Encounter: Payer: Self-pay | Admitting: Internal Medicine

## 2024-01-09 ENCOUNTER — Other Ambulatory Visit (INDEPENDENT_AMBULATORY_CARE_PROVIDER_SITE_OTHER): Payer: Self-pay

## 2024-01-09 ENCOUNTER — Ambulatory Visit: Payer: Self-pay | Admitting: Internal Medicine

## 2024-01-09 VITALS — BP 142/82 | HR 58 | Temp 98.6°F | Ht 66.0 in | Wt 169.0 lb

## 2024-01-09 DIAGNOSIS — R5382 Chronic fatigue, unspecified: Secondary | ICD-10-CM | POA: Diagnosis not present

## 2024-01-09 DIAGNOSIS — E785 Hyperlipidemia, unspecified: Secondary | ICD-10-CM

## 2024-01-09 DIAGNOSIS — R252 Cramp and spasm: Secondary | ICD-10-CM

## 2024-01-09 DIAGNOSIS — Z8249 Family history of ischemic heart disease and other diseases of the circulatory system: Secondary | ICD-10-CM | POA: Diagnosis not present

## 2024-01-09 DIAGNOSIS — I10 Essential (primary) hypertension: Secondary | ICD-10-CM

## 2024-01-09 DIAGNOSIS — Z0000 Encounter for general adult medical examination without abnormal findings: Secondary | ICD-10-CM | POA: Diagnosis not present

## 2024-01-09 LAB — COMPREHENSIVE METABOLIC PANEL WITH GFR
ALT: 12 U/L (ref 0–35)
AST: 15 U/L (ref 0–37)
Albumin: 4.4 g/dL (ref 3.5–5.2)
Alkaline Phosphatase: 55 U/L (ref 39–117)
BUN: 18 mg/dL (ref 6–23)
CO2: 32 meq/L (ref 19–32)
Calcium: 9.7 mg/dL (ref 8.4–10.5)
Chloride: 103 meq/L (ref 96–112)
Creatinine, Ser: 0.7 mg/dL (ref 0.40–1.20)
GFR: 93.26 mL/min (ref >60.00)
Glucose, Bld: 88 mg/dL (ref 70–99)
Potassium: 4.2 meq/L (ref 3.5–5.1)
Sodium: 141 meq/L (ref 135–145)
Total Bilirubin: 0.3 mg/dL (ref 0.2–1.2)
Total Protein: 7 g/dL (ref 6.0–8.3)

## 2024-01-09 LAB — CBC WITH DIFFERENTIAL/PLATELET
Basophils Absolute: 0.1 K/uL (ref 0.0–0.1)
Basophils Relative: 1 % (ref 0.0–3.0)
Eosinophils Absolute: 0.2 K/uL (ref 0.0–0.7)
Eosinophils Relative: 4.1 % (ref 0.0–5.0)
HCT: 40.3 % (ref 36.0–46.0)
Hemoglobin: 13.6 g/dL (ref 12.0–15.0)
Lymphocytes Relative: 32.4 % (ref 12.0–46.0)
Lymphs Abs: 1.7 K/uL (ref 0.7–4.0)
MCHC: 33.8 g/dL (ref 30.0–36.0)
MCV: 97.2 fl (ref 78.0–100.0)
Monocytes Absolute: 0.4 K/uL (ref 0.1–1.0)
Monocytes Relative: 8 % (ref 3.0–12.0)
Neutro Abs: 2.9 K/uL (ref 1.4–7.7)
Neutrophils Relative %: 54.5 % (ref 43.0–77.0)
Platelets: 278 K/uL (ref 150.0–400.0)
RBC: 4.15 Mil/uL (ref 3.87–5.11)
RDW: 13.3 % (ref 11.5–15.5)
WBC: 5.4 K/uL (ref 4.0–10.5)

## 2024-01-09 LAB — LIPID PANEL
Cholesterol: 237 mg/dL — ABNORMAL HIGH (ref 0–200)
HDL: 49 mg/dL (ref >39.00)
LDL Cholesterol: 108 mg/dL — ABNORMAL HIGH (ref 0–99)
NonHDL: 187.99
Total CHOL/HDL Ratio: 5
Triglycerides: 399 mg/dL — ABNORMAL HIGH (ref 0.0–149.0)
VLDL: 79.8 mg/dL — ABNORMAL HIGH (ref 0.0–40.0)

## 2024-01-09 LAB — URINALYSIS
Bilirubin Urine: NEGATIVE
Hgb urine dipstick: NEGATIVE
Ketones, ur: NEGATIVE
Leukocytes,Ua: NEGATIVE
Nitrite: NEGATIVE
Specific Gravity, Urine: 1.015 (ref 1.000–1.030)
Total Protein, Urine: NEGATIVE
Urine Glucose: NEGATIVE
Urobilinogen, UA: 0.2 (ref 0.0–1.0)
pH: 7.5 (ref 5.0–8.0)

## 2024-01-09 LAB — TSH: TSH: 0.97 u[IU]/mL (ref 0.35–5.50)

## 2024-01-09 LAB — MAGNESIUM: Magnesium: 2.1 mg/dL (ref 1.5–2.5)

## 2024-01-09 MED ORDER — SPIRONOLACTONE 25 MG PO TABS: 25.0000 mg | ORAL_TABLET | Freq: Every day | ORAL | 3 refills | Status: AC

## 2024-01-09 MED ORDER — CLONIDINE HCL 0.1 MG PO TABS: 0.1000 mg | ORAL_TABLET | Freq: Three times a day (TID) | ORAL | 3 refills | Status: AC | PRN

## 2024-01-09 MED ORDER — METOPROLOL SUCCINATE ER 25 MG PO TB24: 25.0000 mg | ORAL_TABLET | Freq: Two times a day (BID) | ORAL | 3 refills | Status: AC

## 2024-01-09 NOTE — Progress Notes (Signed)
 Subjective:  Patient ID: Tamara Charles, female    DOB: July 31, 1960  Age: 63 y.o. MRN: [id number]  CC: Medical Management of Chronic Issues (Pt states her BP has been elevated... Pt is wanting to know if the new BP med dosage can be increased to help )   HPI Deavion Strider presents for hypertension follow-up Follow-up on leg cramps-resolved  Outpatient Medications Prior to Visit  Medication Sig Dispense Refill   b complex vitamins tablet Take 1 tablet by mouth daily. 100 tablet 3   calcium carbonate (TUMS - DOSED IN MG ELEMENTAL CALCIUM) 500 MG chewable tablet Chew 1 tablet by mouth 3 (three) times daily as needed for indigestion or heartburn.     Cholecalciferol (VITAMIN D3) 50 MCG (2000 UT) capsule Take 1 capsule (2,000 Units total) by mouth daily. 100 capsule 3   Melatonin 10 MG TABS Take 10 mg by mouth at bedtime.     meloxicam  (MOBIC ) 15 MG tablet Take 15 mg by mouth daily as needed.     naproxen sodium (ANAPROX) 220 MG tablet Take 220 mg by mouth 2 (two) times daily as needed (pain).     nitrofurantoin , macrocrystal-monohydrate, (MACROBID ) 100 MG capsule Take 1 capsule (100 mg total) by mouth 2 (two) times daily. 14 capsule 0   Probiotic Product (ALIGN) 4 MG CAPS Take 1 capsule (4 mg total) by mouth daily. (Patient taking differently: Take 1 capsule by mouth at bedtime.) 30 capsule 1   tirzepatide  (ZEPBOUND ) 2.5 MG/0.5ML Pen Inject 2.5 mg into the skin once a week. 2 mL 3   trimethoprim  (TRIMPEX ) 100 MG tablet Take 100 mg by mouth at bedtime.     valACYclovir  (VALTREX ) 500 MG tablet TAKE 1 TABLET BY MOUTH EVERYDAY AT BEDTIME 90 tablet 3   vitamin B-12 (CYANOCOBALAMIN ) 1000 MCG tablet Take 2,000 mcg by mouth daily.     zinc  gluconate 50 MG tablet Take 50 mg by mouth daily.     aspirin  EC 81 MG tablet Take 1 tablet (81 mg total) by mouth daily. 100 tablet 3   hydrochlorothiazide  (MICROZIDE ) 12.5 MG capsule Take 12.5 mg by mouth daily.     metoprolol  succinate (TOPROL -XL) 25 MG 24  hr tablet Take 1 tablet (25 mg total) by mouth daily. 90 tablet 3   spironolactone  (ALDACTONE ) 25 MG tablet Take 0.5-1 tablets (12.5-25 mg total) by mouth daily. 90 tablet 3   No facility-administered medications prior to visit.    ROS: Review of Systems  Constitutional:  Negative for activity change, appetite change, chills, fatigue and unexpected weight change.  HENT:  Negative for congestion, mouth sores and sinus pressure.   Eyes:  Negative for visual disturbance.  Respiratory:  Negative for cough and chest tightness.   Gastrointestinal:  Negative for abdominal pain and nausea.  Genitourinary:  Negative for difficulty urinating, frequency and vaginal pain.  Musculoskeletal:  Negative for back pain and gait problem.  Skin:  Negative for pallor and rash.  Neurological:  Negative for dizziness, tremors, weakness, numbness and headaches.  Psychiatric/Behavioral:  Negative for confusion and sleep disturbance.     Objective:  BP (!) 142/82   Pulse (!) 58   Temp 98.6 F (37 C) (Oral)   Ht 5' 6 (1.676 m)   Wt 169 lb (76.7 kg)   LMP 06/05/2008   SpO2 98%   BMI 27.28 kg/m   BP Readings from Last 3 Encounters:  01/09/24 (!) 142/82  12/24/23 110/62  12/13/23 110/70    Wt Readings from  Last 3 Encounters:  01/09/24 169 lb (76.7 kg)  12/24/23 168 lb (76.2 kg)  12/13/23 165 lb (74.8 kg)    Physical Exam Constitutional:      General: She is not in acute distress.    Appearance: She is well-developed.  HENT:     Head: Normocephalic.     Right Ear: External ear normal.     Left Ear: External ear normal.     Nose: Nose normal.  Eyes:     General:        Right eye: No discharge.        Left eye: No discharge.     Conjunctiva/sclera: Conjunctivae normal.     Pupils: Pupils are equal, round, and reactive to light.  Neck:     Thyroid : No thyromegaly.     Vascular: No JVD.     Trachea: No tracheal deviation.  Cardiovascular:     Rate and Rhythm: Normal rate and regular  rhythm.     Heart sounds: Normal heart sounds.  Pulmonary:     Effort: No respiratory distress.     Breath sounds: No stridor. No wheezing.  Abdominal:     General: Bowel sounds are normal. There is no distension.     Palpations: Abdomen is soft. There is no mass.     Tenderness: There is no abdominal tenderness. There is no guarding or rebound.  Musculoskeletal:        General: No tenderness.     Cervical back: Normal range of motion and neck supple. No rigidity.  Lymphadenopathy:     Cervical: No cervical adenopathy.  Skin:    Findings: No erythema or rash.  Neurological:     Cranial Nerves: No cranial nerve deficit.     Motor: No abnormal muscle tone.     Coordination: Coordination normal.     Deep Tendon Reflexes: Reflexes normal.  Psychiatric:        Behavior: Behavior normal.        Thought Content: Thought content normal.        Judgment: Judgment normal.     Lab Results  Component Value Date   WBC 5.4 01/09/2024   HGB 13.6 01/09/2024   HCT 40.3 01/09/2024   PLT 278.0 01/09/2024   GLUCOSE 88 01/09/2024   CHOL 237 (H) 01/09/2024   TRIG 399.0 (H) 01/09/2024   HDL 49.00 01/09/2024   LDLDIRECT 151.0 10/19/2022   LDLCALC 108 (H) 01/09/2024   ALT 12 01/09/2024   AST 15 01/09/2024   NA 141 01/09/2024   K 4.2 01/09/2024   CL 103 01/09/2024   CREATININE 0.70 01/09/2024   BUN 18 01/09/2024   CO2 32 01/09/2024   TSH 0.97 01/09/2024   HGBA1C 5.6 06/20/2021    No results found.  Assessment & Plan:   Problem List Items Addressed This Visit     Dyslipidemia   On diet Stop smoking      Family history of early CAD   Cardiac CT scan with calcium scoring ordered      Hypertension - Primary   Olmesartan  caused bad CP per pt - d/c Hydrochlorothiazide  caused  cramps - d/c Take Spironolactone  25 mg/d Increase Toprol  XR 25 mg twice a day Clonidine  - as needed for BP>160-170 Coronary calcium CT was ordered      Relevant Medications   spironolactone   (ALDACTONE ) 25 MG tablet   metoprolol  succinate (TOPROL -XL) 25 MG 24 hr tablet   cloNIDine  (CATAPRES ) 0.1 MG tablet  Meds ordered this encounter  Medications   spironolactone  (ALDACTONE ) 25 MG tablet    Sig: Take 1 tablet (25 mg total) by mouth daily.    Dispense:  90 tablet    Refill:  3   metoprolol  succinate (TOPROL -XL) 25 MG 24 hr tablet    Sig: Take 1 tablet (25 mg total) by mouth in the morning and at bedtime.    Dispense:  180 tablet    Refill:  3   cloNIDine  (CATAPRES ) 0.1 MG tablet    Sig: Take 1 tablet (0.1 mg total) by mouth 3 (three) times daily as needed (for systolic BP>160-170).    Dispense:  90 tablet    Refill:  3      Follow-up: Return in about 3 months (around 04/10/2024) for a follow-up visit.  Marolyn Noel, MD

## 2024-01-09 NOTE — Progress Notes (Signed)
 Subjective:  Patient ID: Tamara Charles, female    DOB: 1961/05/20  Age: 63 y.o. MRN: [id number]  CC: Medical Management of Chronic Issues (Pt states her BP has been elevated... Pt is wanting to know if the new BP med dosage can be increased to help )   HPI Ronelle Michie presents for   Outpatient Medications Prior to Visit  Medication Sig Dispense Refill   aspirin  EC 81 MG tablet Take 1 tablet (81 mg total) by mouth daily. 100 tablet 3   b complex vitamins tablet Take 1 tablet by mouth daily. 100 tablet 3   calcium carbonate (TUMS - DOSED IN MG ELEMENTAL CALCIUM) 500 MG chewable tablet Chew 1 tablet by mouth 3 (three) times daily as needed for indigestion or heartburn.     Cholecalciferol (VITAMIN D3) 50 MCG (2000 UT) capsule Take 1 capsule (2,000 Units total) by mouth daily. 100 capsule 3   Melatonin 10 MG TABS Take 10 mg by mouth at bedtime.     meloxicam  (MOBIC ) 15 MG tablet Take 15 mg by mouth daily as needed.     metoprolol  succinate (TOPROL -XL) 25 MG 24 hr tablet Take 1 tablet (25 mg total) by mouth daily. 90 tablet 3   naproxen sodium (ANAPROX) 220 MG tablet Take 220 mg by mouth 2 (two) times daily as needed (pain).     nitrofurantoin , macrocrystal-monohydrate, (MACROBID ) 100 MG capsule Take 1 capsule (100 mg total) by mouth 2 (two) times daily. 14 capsule 0   Probiotic Product (ALIGN) 4 MG CAPS Take 1 capsule (4 mg total) by mouth daily. (Patient taking differently: Take 1 capsule by mouth at bedtime.) 30 capsule 1   spironolactone  (ALDACTONE ) 25 MG tablet Take 0.5-1 tablets (12.5-25 mg total) by mouth daily. 90 tablet 3   tirzepatide  (ZEPBOUND ) 2.5 MG/0.5ML Pen Inject 2.5 mg into the skin once a week. 2 mL 3   trimethoprim  (TRIMPEX ) 100 MG tablet Take 100 mg by mouth at bedtime.     valACYclovir  (VALTREX ) 500 MG tablet TAKE 1 TABLET BY MOUTH EVERYDAY AT BEDTIME 90 tablet 3   vitamin B-12 (CYANOCOBALAMIN ) 1000 MCG tablet Take 2,000 mcg by mouth daily.     zinc  gluconate 50 MG  tablet Take 50 mg by mouth daily.     hydrochlorothiazide  (MICROZIDE ) 12.5 MG capsule Take 12.5 mg by mouth daily.     No facility-administered medications prior to visit.    ROS: Review of Systems  Constitutional:  Negative for activity change, appetite change, chills, fatigue and unexpected weight change.  HENT:  Negative for congestion, mouth sores and sinus pressure.   Eyes:  Negative for visual disturbance.  Respiratory:  Negative for cough and chest tightness.   Gastrointestinal:  Negative for abdominal pain and nausea.  Genitourinary:  Negative for difficulty urinating, frequency and vaginal pain.  Musculoskeletal:  Negative for back pain and gait problem.  Skin:  Negative for pallor and rash.  Neurological:  Negative for dizziness, tremors, weakness, numbness and headaches.  Psychiatric/Behavioral:  Negative for confusion and sleep disturbance. The patient is not nervous/anxious.     Objective:  BP (!) 142/82   Pulse (!) 58   Temp 98.6 F (37 C) (Oral)   Ht 5' 6 (1.676 m)   Wt 169 lb (76.7 kg)   LMP 06/05/2008   SpO2 98%   BMI 27.28 kg/m   BP Readings from Last 3 Encounters:  01/09/24 (!) 142/82  12/24/23 110/62  12/13/23 110/70    Wt Readings from  Last 3 Encounters:  01/09/24 169 lb (76.7 kg)  12/24/23 168 lb (76.2 kg)  12/13/23 165 lb (74.8 kg)    Physical Exam Constitutional:      General: She is not in acute distress.    Appearance: She is well-developed.  HENT:     Head: Normocephalic.     Right Ear: External ear normal.     Left Ear: External ear normal.     Nose: Nose normal.  Eyes:     General:        Right eye: No discharge.        Left eye: No discharge.     Conjunctiva/sclera: Conjunctivae normal.     Pupils: Pupils are equal, round, and reactive to light.  Neck:     Thyroid : No thyromegaly.     Vascular: No JVD.     Trachea: No tracheal deviation.  Cardiovascular:     Rate and Rhythm: Normal rate and regular rhythm.     Heart  sounds: Normal heart sounds.  Pulmonary:     Effort: No respiratory distress.     Breath sounds: No stridor. No wheezing.  Abdominal:     General: Bowel sounds are normal. There is no distension.     Palpations: Abdomen is soft. There is no mass.     Tenderness: There is no abdominal tenderness. There is no guarding or rebound.  Musculoskeletal:        General: No tenderness.     Cervical back: Normal range of motion and neck supple. No rigidity.  Lymphadenopathy:     Cervical: No cervical adenopathy.  Skin:    Findings: No erythema or rash.  Neurological:     Mental Status: She is oriented to person, place, and time.     Cranial Nerves: No cranial nerve deficit.     Motor: No abnormal muscle tone.     Coordination: Coordination normal.     Deep Tendon Reflexes: Reflexes normal.  Psychiatric:        Behavior: Behavior normal.        Thought Content: Thought content normal.        Judgment: Judgment normal.     Lab Results  Component Value Date   WBC 6.0 09/27/2023   HGB 13.9 09/27/2023   HCT 42.0 09/27/2023   PLT 305 09/27/2023   GLUCOSE 104 (H) 09/27/2023   CHOL 195 08/30/2023   TRIG 271.0 (H) 08/30/2023   HDL 44.30 08/30/2023   LDLDIRECT 151.0 10/19/2022   LDLCALC 96 08/30/2023   ALT 18 08/30/2023   AST 20 08/30/2023   NA 141 09/27/2023   K 4.1 09/27/2023   CL 105 09/27/2023   CREATININE 0.73 09/27/2023   BUN 14 09/27/2023   CO2 23 09/27/2023   TSH 1.23 08/30/2023   HGBA1C 5.6 06/20/2021    No results found.  Assessment & Plan:   Problem List Items Addressed This Visit     Hypertension - Primary   Olmesartan  caused bad CP per pt - d/c Hydrochlorothiazide  caused  cramps - d/c Take Spironolactone  25 mg/d Increase Toprol  XR 25 mg twice a day Clonidine  - as needed for BP>160-170          No orders of the defined types were placed in this encounter.     Follow-up: No follow-ups on file.  Marolyn Noel, MD

## 2024-01-09 NOTE — Assessment & Plan Note (Addendum)
 Olmesartan  caused bad CP per pt - d/c Hydrochlorothiazide  caused  cramps - d/c Take Spironolactone  25 mg/d Increase Toprol  XR 25 mg twice a day Clonidine  - as needed for BP>160-170 Coronary calcium CT was ordered

## 2024-01-09 NOTE — Patient Instructions (Signed)
 Take Spironolactone  25 mg/d Increase Toprol  XR 25 mg twice a day Clonidine  - as needed for BP>160-170

## 2024-01-11 ENCOUNTER — Telehealth: Payer: Self-pay

## 2024-01-11 ENCOUNTER — Ambulatory Visit: Payer: Self-pay | Admitting: Internal Medicine

## 2024-01-11 NOTE — Telephone Encounter (Signed)
 Spoke with the pt and was able to inform her of providers advice as follows Dear Neil, Your labs/tests are normal, except for elevated lipids and triglycerides.  You may want to cut back on carbs.  Limit your drinks to less than 2 a day.  Your magnesium  is normal.  Your potassium is normal.  Hope you are doing well! Sincerely, AP  Pt has stated understanding and has no questions or concerns at this time.

## 2024-01-11 NOTE — Telephone Encounter (Signed)
 Copied from CRM [id number]. Topic: MyChart - Other >> Jan 11, 2024  8:27 AM Tamara Charles wrote: Reason for CRM: patient called stating she can not get into her mychart and she want the nurse to give her a call back in regards to her lab work. The patient stated she does not like the new blood pressure medication she is on CB [phone]

## 2024-01-16 ENCOUNTER — Other Ambulatory Visit: Payer: Self-pay | Admitting: Internal Medicine

## 2024-01-20 ENCOUNTER — Ambulatory Visit: Payer: Self-pay | Admitting: Internal Medicine

## 2024-01-20 DIAGNOSIS — E785 Hyperlipidemia, unspecified: Secondary | ICD-10-CM

## 2024-01-20 DIAGNOSIS — Z8249 Family history of ischemic heart disease and other diseases of the circulatory system: Secondary | ICD-10-CM

## 2024-01-20 DIAGNOSIS — F17200 Nicotine dependence, unspecified, uncomplicated: Secondary | ICD-10-CM

## 2024-01-20 DIAGNOSIS — I2510 Atherosclerotic heart disease of native coronary artery without angina pectoris: Secondary | ICD-10-CM | POA: Insufficient documentation

## 2024-01-20 DIAGNOSIS — I2583 Coronary atherosclerosis due to lipid rich plaque: Secondary | ICD-10-CM

## 2024-01-20 MED ORDER — ASPIRIN EC 81 MG PO TBEC: 81.0000 mg | DELAYED_RELEASE_TABLET | Freq: Every day | ORAL | Status: AC

## 2024-01-22 ENCOUNTER — Telehealth: Payer: Self-pay

## 2024-01-22 DIAGNOSIS — I2583 Coronary atherosclerosis due to lipid rich plaque: Secondary | ICD-10-CM

## 2024-01-22 DIAGNOSIS — E785 Hyperlipidemia, unspecified: Secondary | ICD-10-CM

## 2024-01-22 DIAGNOSIS — I700 Atherosclerosis of aorta: Secondary | ICD-10-CM

## 2024-01-22 NOTE — Telephone Encounter (Signed)
 Copied from CRM #[id number]. Topic: Clinical - Lab/Test Results >> Jan 22, 2024  9:48 AM Tamara Charles wrote: Reason for CRM: Patient called in would like a in dept explanation on CT results , would like a callback

## 2024-01-28 NOTE — Telephone Encounter (Signed)
 I sent the patient a note attached to her CT scan-  Dear Neil, Your coronary calcium CT score is 170.  It is consistent with a moderate degree of coronary atherosclerosis.  Your current management should include a baby aspirin  and a statin medication to reduce your cholesterol.  Statin medications may have leg cramps as a side effect.  We can use Pitavastatin which is less likely to cause muscle cramps.  You should stop smoking.  We can have you see a heart doctor.  Your other chest organs look ok.  There is some scar tissue in the lungs. Sincerely, AP    Thanks

## 2024-01-29 NOTE — Assessment & Plan Note (Signed)
 On diet Stop smoking

## 2024-01-29 NOTE — Assessment & Plan Note (Signed)
 Cardiac CT scan with calcium scoring ordered

## 2024-01-29 NOTE — Telephone Encounter (Signed)
 Copied from CRM [id number]. Topic: Clinical - Lab/Test Results >> Jan 29, 2024 10:06 AM Aleatha BROCKS wrote: Reason for CRM: Patient would like a call from the doctor further explaining her CT results, reading them in mychart is a bit confusing for her

## 2024-01-29 NOTE — Telephone Encounter (Signed)
 LVM for pt to call the clinic in regards to her Cardiac CT score test... Provider has sent her a message via MyChart. However he has also stated the following I sent the patient a note attached to her CT scan-   Dear Neil, Your coronary calcium CT score is 170.  It is consistent with a moderate degree of coronary atherosclerosis.  Your current management should include a baby aspirin  and a statin medication to reduce your cholesterol.  Statin medications may have leg cramps as a side effect.  We can use Pitavastatin which is less likely to cause muscle cramps.  You should stop smoking.  We can have you see a heart doctor.  Your other chest organs look ok.  There is some scar tissue in the lungs. Sincerely, AP     Thanks

## 2024-02-01 NOTE — Telephone Encounter (Signed)
 Pt states she is open to the medication. However, she is VERY concerned with the leg cramps as she still gets charlie horses in her legs from time to time and does not want this to increase this issue.  Pt states she would like to try to better her health with diet change in which she has lost 5 lbs and cut back on bacon, sausage and chesses that she did eat daily or multiple times a day. Pt also stated she would like to try to also cutting back on her smoking as well. She said she will keep cutting back until she does not have the desire to smoke anymore.  Pt will see a heart doctor that is recommended by her PCP.

## 2024-02-04 MED ORDER — PRAVASTATIN SODIUM 20 MG PO TABS: 20.0000 mg | ORAL_TABLET | Freq: Every day | ORAL | 11 refills | Status: DC

## 2024-02-04 NOTE — Telephone Encounter (Signed)
 Pitavastatin prescription that I mentioned earlier it is not covered by her pharmacy plan.  I will send pravastatin  prescription to the pharmacy.  Start with one half daily for a week.  Then switch to the whole one if tolerated.  Please stop if leg cramps.  Thanks

## 2024-02-04 NOTE — Addendum Note (Signed)
 Addended by: Padme Arriaga V on: 02/04/2024 02:40 PM   Modules accepted: Orders

## 2024-02-08 NOTE — Telephone Encounter (Signed)
 Spoke with patient, she states she really does not want to take a Statin medication. She states she normally always gets bad side effects, and she is scared. She is open to a cardiology referral, but also wants to know your thoughts on Zetia for her instead. She also mentions she has been working on eating better, cutting out red meats, drinking less alcohol, and trying to reduce her smoking.

## 2024-02-17 MED ORDER — ICOSAPENT ETHYL 1 G PO CAPS: 2.0000 g | ORAL_CAPSULE | Freq: Two times a day (BID) | ORAL | 11 refills | Status: AC

## 2024-02-17 NOTE — Telephone Encounter (Signed)
 I will arrange for a cardiology consultation.  I will send a prescription for prescription fish oil-take if it is covered.  Thanks

## 2024-02-17 NOTE — Addendum Note (Signed)
 Addended by: Daelan Gatt V on: 02/17/2024 10:04 PM   Modules accepted: Orders

## 2024-03-05 ENCOUNTER — Telehealth: Payer: Self-pay

## 2024-03-05 NOTE — Telephone Encounter (Signed)
 Copied from CRM [id number]. Topic: Clinical - Medication Question >> Mar 05, 2024  9:46 AM Suzen RAMAN wrote: Reason for CRM: patient would like to know if she can discontinue pravastatin  (PRAVACHOL ) 20 MG tablet and start taken pitavastatin 2 mg. Please contact patient to advise. Patient is concern with side effects of pravastatin  (PRAVACHOL ) 20 MG tablet Patient states he Mother recommended medication and she feel like it would work for her as well.    [phone] (M)

## 2024-03-14 ENCOUNTER — Other Ambulatory Visit: Payer: Self-pay | Admitting: Internal Medicine

## 2024-03-14 MED ORDER — PITAVASTATIN CALCIUM 2 MG PO TABS: 2.0000 mg | ORAL_TABLET | Freq: Every day | ORAL | 11 refills | Status: AC

## 2024-03-14 NOTE — Telephone Encounter (Signed)
 Called and spoke with patient. Informed her of Dr.Plotnikov placing prescription and to follow up with us  if there are any intolerances or issues with insurance

## 2024-03-14 NOTE — Telephone Encounter (Signed)
 I sent a prescription for pitavastatin to the pharmacy as requested.  It may or may not be covered by her insurance.  Thanks

## 2024-05-05 ENCOUNTER — Emergency Department (HOSPITAL_BASED_OUTPATIENT_CLINIC_OR_DEPARTMENT_OTHER)
Admission: EM | Admit: 2024-05-05 | Discharge: 2024-05-05 | Disposition: A | Discharge status: Home / Self Care | Source: Ambulatory Visit | Attending: Emergency Medicine | Admitting: Emergency Medicine

## 2024-05-05 ENCOUNTER — Other Ambulatory Visit: Payer: Self-pay

## 2024-05-05 ENCOUNTER — Encounter (HOSPITAL_BASED_OUTPATIENT_CLINIC_OR_DEPARTMENT_OTHER): Payer: Self-pay | Admitting: Emergency Medicine

## 2024-05-05 ENCOUNTER — Ambulatory Visit: Payer: Self-pay

## 2024-05-05 ENCOUNTER — Emergency Department (HOSPITAL_BASED_OUTPATIENT_CLINIC_OR_DEPARTMENT_OTHER)

## 2024-05-05 DIAGNOSIS — K5792 Diverticulitis of intestine, part unspecified, without perforation or abscess without bleeding: Secondary | ICD-10-CM | POA: Insufficient documentation

## 2024-05-05 DIAGNOSIS — I2510 Atherosclerotic heart disease of native coronary artery without angina pectoris: Secondary | ICD-10-CM | POA: Diagnosis not present

## 2024-05-05 DIAGNOSIS — I10 Essential (primary) hypertension: Secondary | ICD-10-CM | POA: Insufficient documentation

## 2024-05-05 DIAGNOSIS — F17290 Nicotine dependence, other tobacco product, uncomplicated: Secondary | ICD-10-CM | POA: Insufficient documentation

## 2024-05-05 DIAGNOSIS — R109 Unspecified abdominal pain: Secondary | ICD-10-CM | POA: Diagnosis not present

## 2024-05-05 DIAGNOSIS — R10A2 Flank pain, left side: Secondary | ICD-10-CM

## 2024-05-05 DIAGNOSIS — J439 Emphysema, unspecified: Secondary | ICD-10-CM | POA: Diagnosis not present

## 2024-05-05 DIAGNOSIS — K429 Umbilical hernia without obstruction or gangrene: Secondary | ICD-10-CM | POA: Diagnosis not present

## 2024-05-05 LAB — CBC WITH DIFFERENTIAL/PLATELET
Abs Immature Granulocytes: 0.02 K/uL (ref 0.00–0.07)
Basophils Absolute: 0 K/uL (ref 0.0–0.1)
Basophils Relative: 1 %
Eosinophils Absolute: 0.1 K/uL (ref 0.0–0.5)
Eosinophils Relative: 2 %
HCT: 40.9 % (ref 36.0–46.0)
Hemoglobin: 13.7 g/dL (ref 12.0–15.0)
Immature Granulocytes: 0 %
Lymphocytes Relative: 21 %
Lymphs Abs: 1.3 K/uL (ref 0.7–4.0)
MCH: 32.4 pg (ref 26.0–34.0)
MCHC: 33.5 g/dL (ref 30.0–36.0)
MCV: 96.7 fL (ref 80.0–100.0)
Monocytes Absolute: 0.4 K/uL (ref 0.1–1.0)
Monocytes Relative: 6 %
Neutro Abs: 4.2 K/uL (ref 1.7–7.7)
Neutrophils Relative %: 70 %
Platelets: 275 K/uL (ref 150–400)
RBC: 4.23 MIL/uL (ref 3.87–5.11)
RDW: 12.6 % (ref 11.5–15.5)
WBC: 6 K/uL (ref 4.0–10.5)
nRBC: 0 % (ref 0.0–0.2)

## 2024-05-05 LAB — COMPREHENSIVE METABOLIC PANEL WITH GFR
ALT: 14 U/L (ref 0–44)
AST: 20 U/L (ref 15–41)
Albumin: 4.5 g/dL (ref 3.5–5.0)
Alkaline Phosphatase: 55 U/L (ref 38–126)
Anion gap: 11 (ref 5–15)
BUN: 18 mg/dL (ref 8–23)
CO2: 26 mmol/L (ref 22–32)
Calcium: 9.7 mg/dL (ref 8.9–10.3)
Chloride: 104 mmol/L (ref 98–111)
Creatinine, Ser: 0.77 mg/dL (ref 0.44–1.00)
GFR, Estimated: >60 mL/min (ref >60)
Glucose, Bld: 111 mg/dL — ABNORMAL HIGH (ref 70–99)
Potassium: 4.2 mmol/L (ref 3.5–5.1)
Sodium: 141 mmol/L (ref 135–145)
Total Bilirubin: 0.5 mg/dL (ref 0.0–1.2)
Total Protein: 6.8 g/dL (ref 6.5–8.1)

## 2024-05-05 LAB — URINALYSIS, ROUTINE W REFLEX MICROSCOPIC
Bilirubin Urine: NEGATIVE
Glucose, UA: NEGATIVE mg/dL
Hgb urine dipstick: NEGATIVE
Ketones, ur: NEGATIVE mg/dL
Leukocytes,Ua: NEGATIVE
Nitrite: NEGATIVE
Protein, ur: NEGATIVE mg/dL
Specific Gravity, Urine: 1.01 (ref 1.005–1.030)
pH: 7 (ref 5.0–8.0)

## 2024-05-05 LAB — LIPASE, BLOOD: Lipase: 36 U/L (ref 11–51)

## 2024-05-05 MED ORDER — AMOXICILLIN-POT CLAVULANATE 875-125 MG PO TABS: 1.0000 | ORAL_TABLET | Freq: Two times a day (BID) | ORAL | 0 refills | Status: AC

## 2024-05-05 MED ORDER — IOHEXOL 350 MG/ML SOLN
100.0000 mL | Freq: Once | INTRAVENOUS | Status: AC | PRN
  Administered 2024-05-05: 100 mL via INTRAVENOUS

## 2024-05-05 MED ORDER — LOPERAMIDE HCL 2 MG PO CAPS: 2.0000 mg | ORAL_CAPSULE | Freq: Four times a day (QID) | ORAL | 0 refills | Status: AC | PRN

## 2024-05-05 NOTE — ED Triage Notes (Signed)
 Left flank pain , no urinary symptoms , no Hx kidney stones . Reports Hx UTIS and it is usual to have this kind of pain with UTI .

## 2024-05-05 NOTE — Discharge Instructions (Signed)
 CT scan shows some very early diverticulitis near the top left part of your colon.  This may be causing your symptoms and I am starting you on antibiotic.  Please keep your appointment with your primary care doctor tomorrow and discuss if you need further referral to gastroenterology.  Return with any new or suddenly worsening symptoms.

## 2024-05-05 NOTE — ED Notes (Signed)
 ED Provider at bedside.

## 2024-05-05 NOTE — ED Provider Notes (Signed)
 Emergency Department Provider Note   I have reviewed the triage vital signs and the nursing notes.   HISTORY  Chief Complaint Flank Pain (left)   HPI Tamara Charles is a 63 y.o. female presents to the emergency department with left flank pain.  She initially thought she could be getting a urine infection but denies any dysuria, hesitancy or urgency.  No vaginal bleeding or discharge.  She denies any significant anterior abdominal pain.  She has no history of kidney stones.  No fevers or chills.  No pain into the chest.  Some radiation of symptoms to the back.   Past Medical History:  Diagnosis Date   Arthritis    GERD (gastroesophageal reflux disease)    Hypertension    Spontaneous pneumothorax    at 63 yo   UTI (urinary tract infection)     Review of Systems  Constitutional: No fever/chills Cardiovascular: Denies chest pain. Respiratory: Denies shortness of breath. Gastrointestinal: Positive left flank/abdominal pain.  No nausea, no vomiting.  No diarrhea.  No constipation. Musculoskeletal: Positive for back pain. Skin: Negative for rash. Neurological: Negative for headaches.  ____________________________________________   PHYSICAL EXAM:  VITAL SIGNS: ED Triage Vitals  Encounter Vitals Group     BP 05/05/24 1123 (!) 145/63     Pulse Rate 05/05/24 1123 (!) 59     Resp 05/05/24 1123 16     Temp 05/05/24 1123 98 F (36.7 C)     Temp src --      SpO2 05/05/24 1123 98 %     Weight 05/05/24 1121 170 lb (77.1 kg)   Constitutional: Alert and oriented. Well appearing and in no acute distress. Eyes: Conjunctivae are normal.  Head: Atraumatic. Nose: No congestion/rhinnorhea. Mouth/Throat: Mucous membranes are moist.  Neck: No stridor.   Cardiovascular: Normal rate, regular rhythm. Good peripheral circulation. Grossly normal heart sounds.   Respiratory: Normal respiratory effort.  No retractions. Lungs CTAB. Gastrointestinal: Soft and nontender. No distention.   Musculoskeletal: No lower extremity tenderness nor edema. No gross deformities of extremities. Neurologic:  Normal speech and language. No gross focal neurologic deficits are appreciated.  Skin:  Skin is warm, dry and intact. No rash noted.   ____________________________________________   LABS (all labs ordered are listed, but only abnormal results are displayed)  Labs Reviewed  URINALYSIS, ROUTINE W REFLEX MICROSCOPIC - Abnormal; Notable for the following components:      Result Value   Color, Urine STRAW (*)    All other components within normal limits  COMPREHENSIVE METABOLIC PANEL WITH GFR - Abnormal; Notable for the following components:   Glucose, Bld 111 (*)    All other components within normal limits  LIPASE, BLOOD  CBC WITH DIFFERENTIAL/PLATELET   ____________________________________________  RADIOLOGY  CT Angio Chest/Abd/Pel for Dissection W and/or Wo Contrast Result Date: 05/05/2024 CLINICAL DATA:  Acute aortic syndrome (AAS) suspected. Left flank pain. No urinary symptoms. No history of kidney stones. EXAM: CT ANGIOGRAPHY CHEST, ABDOMEN AND PELVIS TECHNIQUE: Non-contrast CT of the chest was initially obtained. Multidetector CT imaging through the chest, abdomen and pelvis was performed using the standard protocol during bolus administration of intravenous contrast. Multiplanar reconstructed images and MIPs were obtained and reviewed to evaluate the vascular anatomy. RADIATION DOSE REDUCTION: This exam was performed according to the departmental dose-optimization program which includes automated exposure control, adjustment of the mA and/or kV according to patient size and/or use of iterative reconstruction technique. CONTRAST:  OMNIPAQUE  IOHEXOL  350 MG/ML SOLN COMPARISON:  CT  scan abdomen and pelvis from 03/17/2021. FINDINGS: CTA CHEST FINDINGS Cardiovascular: No intramural hematoma noted in the thoracic aorta on the unenhanced images. Thoracic aorta is normal in  caliber without aneurysm, dissection, vasculitis or significant stenosis. There is dilation of the main pulmonary trunk measuring up to 3.5 cm, which is nonspecific but can be seen with pulmonary artery hypertension. Normal cardiac size. No pericardial effusion. No aortic aneurysm. There are coronary artery calcifications, in keeping with coronary artery disease. There are also mild peripheral atherosclerotic vascular calcifications of thoracic aorta and its major branches. Mediastinum/Nodes: Visualized thyroid  gland appears grossly unremarkable. No solid / cystic mediastinal masses. The esophagus is nondistended precluding optimal assessment. No axillary, mediastinal or hilar lymphadenopathy by size criteria. Lungs/Pleura: The central tracheo-bronchial tree is patent. Minimal right apical paraseptal emphysematous changes noted. There are postsurgical changes in the left lung. There are patchy areas of linear, plate-like atelectasis and/or scarring throughout bilateral lungs. No mass or consolidation. No pleural effusion or pneumothorax. No suspicious lung nodules. Musculoskeletal: The visualized soft tissues of the chest wall are grossly unremarkable. No suspicious osseous lesions. There are mild multilevel degenerative changes in the visualized spine. Review of the MIP images confirms the above findings. CTA ABDOMEN AND PELVIS FINDINGS VASCULAR Aorta: Normal caliber aorta without aneurysm, dissection, vasculitis or significant stenosis. Mild patchy atherosclerotic vascular calcifications noted. Celiac: Patent without evidence of aneurysm, dissection, vasculitis or significant stenosis. SMA: Patent without evidence of aneurysm, dissection, vasculitis or significant stenosis. Renals: Both renal arteries are patent without evidence of aneurysm, dissection, vasculitis, fibromuscular dysplasia or significant stenosis. IMA: Patent without evidence of aneurysm, dissection, vasculitis or significant stenosis. Inflow:  Patent without evidence of aneurysm, dissection, vasculitis or significant stenosis. Veins: No obvious venous abnormality within the limitations of this arterial phase study. Review of the MIP images confirms the above findings. NON-VASCULAR Hepatobiliary: The liver is normal in size. Non-cirrhotic configuration. No suspicious mass. No intrahepatic or extrahepatic bile duct dilation. No calcified gallstones. Normal gallbladder wall thickness. No pericholecystic inflammatory changes. Pancreas: Unremarkable. No pancreatic ductal dilatation or surrounding inflammatory changes. Spleen: Within normal limits. No focal lesion. Adrenals/Urinary Tract: Adrenal glands are unremarkable. No suspicious renal mass. No hydronephrosis. No renal or ureteric calculi. Unremarkable urinary bladder. Stomach/Bowel: There is a small sliding hiatal hernia. No disproportionate dilation of the small or large bowel loops. There is subtle fat stranding surrounding 2 adjacent diverticula in the splenic flexure of colon (series 302, image 126), without associated abnormal bowel wall thickening. Findings favor early/subacute diverticulitis. The appendix is unremarkable. Vascular/Lymphatic: No ascites or pneumoperitoneum. No abdominal or pelvic lymphadenopathy, by size criteria. Reproductive: The uterus is surgically absent. No large adnexal mass. Other: There is a tiny fat containing umbilical hernia. The soft tissues and abdominal wall are otherwise unremarkable. Musculoskeletal: No suspicious osseous lesions. There are mild - moderate multilevel degenerative changes in the visualized spine. Review of the MIP images confirms the above findings. IMPRESSION: 1. No acute aortic syndrome. No aortic aneurysm, dissection, vasculitis or significant stenosis. 2. There is subtle fat stranding surrounding 2 adjacent diverticula in the splenic flexure of colon, without associated abnormal bowel wall thickening. Findings favor early/subacute diverticulitis.  No perforation or abscess. 3. Multiple other nonacute observations, as described above. Aortic Atherosclerosis (ICD10-I70.0) and Emphysema (ICD10-J43.9). Electronically Signed   By: Ree Molt M.D.   On: 05/05/2024 13:49    ____________________________________________   PROCEDURES  Procedure(s) performed:   Procedures  None  ____________________________________________   INITIAL IMPRESSION / ASSESSMENT AND PLAN /  ED COURSE  Pertinent labs & imaging results that were available during my care of the patient were reviewed by me and considered in my medical decision making (see chart for details).   This patient is Presenting for Evaluation of abd pain, which does require a range of treatment options, and is a complaint that involves a high risk of morbidity and mortality.  The Differential Diagnoses Differential diagnosis includes but is not exclusive to acute appendicitis, urinary tract infection, aortic dissection, endometriosis, bowel obstruction, hernia, colitis, renal colic, gastroenteritis, volvulus etc.   Critical Interventions-    Medications  iohexol  (OMNIPAQUE ) 350 MG/ML injection 100 mL (100 mLs Intravenous Contrast Given 05/05/24 1255)   Clinical Laboratory Tests Ordered, included UA without infection.  CBC without leukocytosis or anemia.  No acute kidney injury.  LFTs and bilirubin normal.  Radiologic Tests Ordered, included CTA dissection. I independently interpreted the images and agree with radiology interpretation.   Cardiac Monitor Tracing which shows NSR.    Social Determinants of Health Risk patient is a smoker.   Medical Decision Making: Summary:  The patient presents the emergency department with left flank pain.  No evidence of UTI on UA.  No urinary symptoms.  Patient does have risk factors for vascular pathology such as smoking and high blood pressure.  Plan for CTA dissection and reassess.  Reevaluation with update and discussion with patient.  Pain  improved.  She appears to have some very early diverticulitis.  She has a history of this and is actually had colon resected in the past.  The area of inflammation is at the splenic flexure roughly correlating with area of pain.  Plan to start antibiotics.  She has allergy listed to Augmentin  which describes diarrhea but that she was able to tolerate this.  Advised that she follow closely with her PCP.  Patient's presentation is most consistent with acute presentation with potential threat to life or bodily function.   Disposition: discharge  ____________________________________________  FINAL CLINICAL IMPRESSION(S) / ED DIAGNOSES  Final diagnoses:  Left flank pain  Diverticulitis     NEW OUTPATIENT MEDICATIONS STARTED DURING THIS VISIT:  Discharge Medication List as of 05/05/2024  2:02 PM     START taking these medications   Details  amoxicillin -clavulanate (AUGMENTIN ) 875-125 MG tablet Take 1 tablet by mouth every 12 (twelve) hours., Starting Mon 05/05/2024, Normal    loperamide (IMODIUM) 2 MG capsule Take 1 capsule (2 mg total) by mouth 4 (four) times daily as needed for diarrhea or loose stools., Starting Mon 05/05/2024, Normal        Note:  This document was prepared using Dragon voice recognition software and may include unintentional dictation errors.  Fonda Law, MD, [facility]    Louise Rawson, Fonda MATSU, MD 05/06/24 [phone]

## 2024-05-05 NOTE — Telephone Encounter (Signed)
 FYI Only or Action Required?: Action required by provider: request for appointment and Pt requests antibiotics as she believes this is a UTI and requests sooner appt as she is scheduled tomorrow at 3p. Advised ED due to sudden onset and age, pt agrees to have someone transport her to ED today but is hesitant to go.  Patient was last seen in primary care on 01/09/2024 by Plotnikov, Karlynn GAILS, MD.  Called Nurse Triage reporting Flank Pain.  Symptoms began yesterday.  Interventions attempted: Nothing.  Symptoms are: gradually worsening.  Triage Disposition: Go to ED Now (Notify PCP)  Patient/caregiver understands and will follow disposition?: Yes     Copied from CRM [id number]. Topic: Clinical - Red Word Triage >> May 05, 2024 10:26 AM Mercedes MATSU wrote: Red Word that prompted transfer to Nurse Triage: Patient called in stating she has a UTI, pain on left side, wanted to know if she can be prescribed antibiotics if possible. Reason for Disposition  [1] Sudden onset of severe flank pain AND [2] age > 60 years  Answer Assessment - Initial Assessment Questions 1. LOCATION: Where does it hurt? (e.g., left, right)     Left flank 2. ONSET: When did the pain start?     Yesterday evening 3. SEVERITY: How bad is the pain? (e.g., Scale 1-10; mild, moderate, or severe)     7/10 4. PATTERN: Does the pain come and go, or is it constant?      Constant 5. CAUSE: What do you think is causing the pain?     Possible UTI 6. OTHER SYMPTOMS:  Do you have any other symptoms? (e.g., fever, abdomen pain, vomiting, leg weakness, burning with urination, blood in urine)     Nausea  Protocols used: Flank Pain-A-AH

## 2024-05-06 ENCOUNTER — Ambulatory Visit: Admitting: Internal Medicine

## 2024-05-06 ENCOUNTER — Encounter: Payer: Self-pay | Admitting: Internal Medicine

## 2024-05-06 VITALS — BP 136/80 | HR 60 | Temp 98.3°F | Ht 66.0 in | Wt 166.6 lb

## 2024-05-06 DIAGNOSIS — K5792 Diverticulitis of intestine, part unspecified, without perforation or abscess without bleeding: Secondary | ICD-10-CM

## 2024-05-06 DIAGNOSIS — M533 Sacrococcygeal disorders, not elsewhere classified: Secondary | ICD-10-CM | POA: Insufficient documentation

## 2024-05-06 DIAGNOSIS — R10A2 Flank pain, left side: Secondary | ICD-10-CM | POA: Diagnosis not present

## 2024-05-06 MED ORDER — METHOCARBAMOL 500 MG PO TABS: 500.0000 mg | ORAL_TABLET | Freq: Three times a day (TID) | ORAL | 0 refills | Status: AC | PRN

## 2024-05-06 MED ORDER — METHYLPREDNISOLONE 4 MG PO TBPK: ORAL_TABLET | ORAL | 0 refills | Status: AC

## 2024-05-06 MED ORDER — HYDROCODONE-ACETAMINOPHEN 5-325 MG PO TABS: 1.0000 | ORAL_TABLET | Freq: Four times a day (QID) | ORAL | 0 refills | Status: AC | PRN

## 2024-05-06 NOTE — Assessment & Plan Note (Addendum)
 L SI joint pain Left side acute onset severe pain.  Probable stuck L SI joint  Robaxin prn Norco prn Medrol  pack Ice/heat SI release exercises on YouTube Chiropractic treatment suggested

## 2024-05-06 NOTE — Telephone Encounter (Signed)
 Seen today.

## 2024-05-06 NOTE — Assessment & Plan Note (Addendum)
 Left side acute onset severe pain.  Probable stuck L SI joint  Robaxin prn Norco prn Medrol  pack Ice/heat SI release exercises on YouTube Chiropractic treatment suggested

## 2024-05-06 NOTE — Assessment & Plan Note (Signed)
 Possible mild diverticulitis detected by abdominal CT scan in the left splenic flexure of the colon .  Augmentin  and a couple days if no abdominal pain.  Symptoms and exam are not supportive over the diverticulitis diagnosis

## 2024-05-06 NOTE — Progress Notes (Signed)
 Subjective:  Patient ID: Tamara Charles, female    DOB: [birth year]  Age: 63 y.o. MRN: [id number]  CC: Medical Management of Chronic Issues (6 Month follow up)   HPI Azelia Reiger presents for L flank pain.  The pain started suddenly in the left SI joint area.  The pain was extremely severe and made the patient nauseated.  She went to the ER thinking that she has a kidney stone or urinary infection.  She underwent CT of the abdomen with and angiogram.  Her tests were normal.  There was some fat striation near 2 small diverticuli at the splenic flexure suspicious of her early stage of diverticulitis.  She was given Augmentin .  The pain may be a little better.  There is no abdominal pain  Outpatient Medications Prior to Visit  Medication Sig Dispense Refill   amoxicillin -clavulanate (AUGMENTIN ) 875-125 MG tablet Take 1 tablet by mouth every 12 (twelve) hours. 14 tablet 0   aspirin  EC 81 MG tablet Take 1 tablet (81 mg total) by mouth daily.     b complex vitamins tablet Take 1 tablet by mouth daily. 100 tablet 3   calcium  carbonate (TUMS - DOSED IN MG ELEMENTAL CALCIUM ) 500 MG chewable tablet Chew 1 tablet by mouth 3 (three) times daily as needed for indigestion or heartburn.     Cholecalciferol (VITAMIN D3) 50 MCG (2000 UT) capsule Take 1 capsule (2,000 Units total) by mouth daily. 100 capsule 3   cloNIDine  (CATAPRES ) 0.1 MG tablet Take 1 tablet (0.1 mg total) by mouth 3 (three) times daily as needed (for systolic BP>160-170). 90 tablet 3   hydrochlorothiazide  (MICROZIDE ) 12.5 MG capsule Take 12.5 mg by mouth daily.     icosapent  Ethyl (VASCEPA ) 1 g capsule Take 2 capsules (2 g total) by mouth 2 (two) times daily. 120 capsule 11   loperamide (IMODIUM) 2 MG capsule Take 1 capsule (2 mg total) by mouth 4 (four) times daily as needed for diarrhea or loose stools. 12 capsule 0   Melatonin 10 MG TABS Take 10 mg by mouth at bedtime.     meloxicam  (MOBIC ) 15 MG tablet Take 15 mg by mouth daily as  needed.     metoprolol  succinate (TOPROL -XL) 25 MG 24 hr tablet Take 1 tablet (25 mg total) by mouth in the morning and at bedtime. 180 tablet 3   naproxen sodium (ANAPROX) 220 MG tablet Take 220 mg by mouth 2 (two) times daily as needed (pain).     nitrofurantoin , macrocrystal-monohydrate, (MACROBID ) 100 MG capsule Take 1 capsule (100 mg total) by mouth 2 (two) times daily. 14 capsule 0   Pitavastatin  Calcium  2 MG TABS Take 1 tablet (2 mg total) by mouth daily. 30 tablet 11   Probiotic Product (ALIGN) 4 MG CAPS Take 1 capsule (4 mg total) by mouth daily. (Patient taking differently: Take 1 capsule by mouth at bedtime.) 30 capsule 1   spironolactone  (ALDACTONE ) 25 MG tablet Take 1 tablet (25 mg total) by mouth daily. 90 tablet 3   tirzepatide  (ZEPBOUND ) 2.5 MG/0.5ML Pen Inject 2.5 mg into the skin once a week. 2 mL 3   trimethoprim  (TRIMPEX ) 100 MG tablet Take 100 mg by mouth at bedtime.     valACYclovir  (VALTREX ) 500 MG tablet TAKE 1 TABLET BY MOUTH EVERYDAY AT BEDTIME 90 tablet 3   vitamin B-12 (CYANOCOBALAMIN ) 1000 MCG tablet Take 2,000 mcg by mouth daily.     zinc  gluconate 50 MG tablet Take 50 mg by mouth daily.  No facility-administered medications prior to visit.    ROS: Review of Systems  Constitutional:  Negative for activity change, appetite change, chills, fatigue and unexpected weight change.  HENT:  Negative for congestion, mouth sores and sinus pressure.   Eyes:  Negative for visual disturbance.  Respiratory:  Negative for cough and chest tightness.   Cardiovascular:  Negative for leg swelling.  Gastrointestinal:  Negative for abdominal pain and nausea.  Genitourinary:  Negative for difficulty urinating, frequency, urgency and vaginal pain.  Musculoskeletal:  Positive for back pain. Negative for gait problem.  Skin:  Negative for pallor and rash.  Neurological:  Negative for dizziness, tremors, weakness, numbness and headaches.  Psychiatric/Behavioral:  Negative for  confusion and sleep disturbance.     Objective:  BP 136/80   Pulse 60   Temp 98.3 F (36.8 C)   Ht 5' 6 (1.676 m)   Wt 166 lb 9.6 oz (75.6 kg)   LMP 06/05/2008   SpO2 93%   BMI 26.89 kg/m   BP Readings from Last 3 Encounters:  05/06/24 136/80  05/05/24 (!) 116/53  01/09/24 (!) 142/82    Wt Readings from Last 3 Encounters:  05/06/24 166 lb 9.6 oz (75.6 kg)  05/05/24 170 lb (77.1 kg)  01/09/24 169 lb (76.7 kg)    Physical Exam Constitutional:      General: She is not in acute distress.    Appearance: She is well-developed.  HENT:     Head: Normocephalic.     Right Ear: External ear normal.     Left Ear: External ear normal.     Nose: Nose normal.  Eyes:     General:        Right eye: No discharge.        Left eye: No discharge.     Conjunctiva/sclera: Conjunctivae normal.     Pupils: Pupils are equal, round, and reactive to light.  Neck:     Thyroid : No thyromegaly.     Vascular: No JVD.     Trachea: No tracheal deviation.  Cardiovascular:     Rate and Rhythm: Normal rate and regular rhythm.     Heart sounds: Normal heart sounds.  Pulmonary:     Effort: No respiratory distress.     Breath sounds: No stridor. No wheezing.  Abdominal:     General: Bowel sounds are normal. There is no distension.     Palpations: Abdomen is soft. There is no mass.     Tenderness: There is no abdominal tenderness. There is no guarding or rebound.  Musculoskeletal:        General: Tenderness present.     Cervical back: Normal range of motion and neck supple. No rigidity.  Lymphadenopathy:     Cervical: No cervical adenopathy.  Skin:    Findings: No erythema or rash.  Neurological:     Cranial Nerves: No cranial nerve deficit.     Motor: No abnormal muscle tone.     Coordination: Coordination normal.     Deep Tendon Reflexes: Reflexes normal.  Psychiatric:        Behavior: Behavior normal.        Thought Content: Thought content normal.        Judgment: Judgment normal.     RL SI joint is very tender.  Abdomen is soft nontender.  No costovertebral tenderness.  No rash.  No mass.  No iliopsoas symptoms.  No hernia  Lab Results  Component Value Date   WBC 6.0 05/05/2024  HGB 13.7 05/05/2024   HCT 40.9 05/05/2024   PLT 275 05/05/2024   GLUCOSE 111 (H) 05/05/2024   CHOL 237 (H) 01/09/2024   TRIG 399.0 (H) 01/09/2024   HDL 49.00 01/09/2024   LDLDIRECT 151.0 10/19/2022   LDLCALC 108 (H) 01/09/2024   ALT 14 05/05/2024   AST 20 05/05/2024   NA 141 05/05/2024   K 4.2 05/05/2024   CL 104 05/05/2024   CREATININE 0.77 05/05/2024   BUN 18 05/05/2024   CO2 26 05/05/2024   TSH 0.97 01/09/2024   HGBA1C 5.6 06/20/2021    CT Angio Chest/Abd/Pel for Dissection W and/or Wo Contrast Result Date: 05/05/2024 CLINICAL DATA:  Acute aortic syndrome (AAS) suspected. Left flank pain. No urinary symptoms. No history of kidney stones. EXAM: CT ANGIOGRAPHY CHEST, ABDOMEN AND PELVIS TECHNIQUE: Non-contrast CT of the chest was initially obtained. Multidetector CT imaging through the chest, abdomen and pelvis was performed using the standard protocol during bolus administration of intravenous contrast. Multiplanar reconstructed images and MIPs were obtained and reviewed to evaluate the vascular anatomy. RADIATION DOSE REDUCTION: This exam was performed according to the departmental dose-optimization program which includes automated exposure control, adjustment of the mA and/or kV according to patient size and/or use of iterative reconstruction technique. CONTRAST:  OMNIPAQUE  IOHEXOL  350 MG/ML SOLN COMPARISON:  CT scan abdomen and pelvis from 03/17/2021. FINDINGS: CTA CHEST FINDINGS Cardiovascular: No intramural hematoma noted in the thoracic aorta on the unenhanced images. Thoracic aorta is normal in caliber without aneurysm, dissection, vasculitis or significant stenosis. There is dilation of the main pulmonary trunk measuring up to 3.5 cm, which is nonspecific but can be seen  with pulmonary artery hypertension. Normal cardiac size. No pericardial effusion. No aortic aneurysm. There are coronary artery calcifications, in keeping with coronary artery disease. There are also mild peripheral atherosclerotic vascular calcifications of thoracic aorta and its major branches. Mediastinum/Nodes: Visualized thyroid  gland appears grossly unremarkable. No solid / cystic mediastinal masses. The esophagus is nondistended precluding optimal assessment. No axillary, mediastinal or hilar lymphadenopathy by size criteria. Lungs/Pleura: The central tracheo-bronchial tree is patent. Minimal right apical paraseptal emphysematous changes noted. There are postsurgical changes in the left lung. There are patchy areas of linear, plate-like atelectasis and/or scarring throughout bilateral lungs. No mass or consolidation. No pleural effusion or pneumothorax. No suspicious lung nodules. Musculoskeletal: The visualized soft tissues of the chest wall are grossly unremarkable. No suspicious osseous lesions. There are mild multilevel degenerative changes in the visualized spine. Review of the MIP images confirms the above findings. CTA ABDOMEN AND PELVIS FINDINGS VASCULAR Aorta: Normal caliber aorta without aneurysm, dissection, vasculitis or significant stenosis. Mild patchy atherosclerotic vascular calcifications noted. Celiac: Patent without evidence of aneurysm, dissection, vasculitis or significant stenosis. SMA: Patent without evidence of aneurysm, dissection, vasculitis or significant stenosis. Renals: Both renal arteries are patent without evidence of aneurysm, dissection, vasculitis, fibromuscular dysplasia or significant stenosis. IMA: Patent without evidence of aneurysm, dissection, vasculitis or significant stenosis. Inflow: Patent without evidence of aneurysm, dissection, vasculitis or significant stenosis. Veins: No obvious venous abnormality within the limitations of this arterial phase study. Review of  the MIP images confirms the above findings. NON-VASCULAR Hepatobiliary: The liver is normal in size. Non-cirrhotic configuration. No suspicious mass. No intrahepatic or extrahepatic bile duct dilation. No calcified gallstones. Normal gallbladder wall thickness. No pericholecystic inflammatory changes. Pancreas: Unremarkable. No pancreatic ductal dilatation or surrounding inflammatory changes. Spleen: Within normal limits. No focal lesion. Adrenals/Urinary Tract: Adrenal glands are unremarkable. No suspicious renal mass.  No hydronephrosis. No renal or ureteric calculi. Unremarkable urinary bladder. Stomach/Bowel: There is a small sliding hiatal hernia. No disproportionate dilation of the small or large bowel loops. There is subtle fat stranding surrounding 2 adjacent diverticula in the splenic flexure of colon (series 302, image 126), without associated abnormal bowel wall thickening. Findings favor early/subacute diverticulitis. The appendix is unremarkable. Vascular/Lymphatic: No ascites or pneumoperitoneum. No abdominal or pelvic lymphadenopathy, by size criteria. Reproductive: The uterus is surgically absent. No large adnexal mass. Other: There is a tiny fat containing umbilical hernia. The soft tissues and abdominal wall are otherwise unremarkable. Musculoskeletal: No suspicious osseous lesions. There are mild - moderate multilevel degenerative changes in the visualized spine. Review of the MIP images confirms the above findings. IMPRESSION: 1. No acute aortic syndrome. No aortic aneurysm, dissection, vasculitis or significant stenosis. 2. There is subtle fat stranding surrounding 2 adjacent diverticula in the splenic flexure of colon, without associated abnormal bowel wall thickening. Findings favor early/subacute diverticulitis. No perforation or abscess. 3. Multiple other nonacute observations, as described above. Aortic Atherosclerosis (ICD10-I70.0) and Emphysema (ICD10-J43.9). Electronically Signed   By:  Ree Molt M.D.   On: 05/05/2024 13:49    Assessment & Plan:   Problem List Items Addressed This Visit     Diverticulitis   Possible mild diverticulitis detected by abdominal CT scan in the left splenic flexure of the colon .  Augmentin  and a couple days if no abdominal pain.  Symptoms and exam are not supportive over the diverticulitis diagnosis      Left flank pain   L SI joint pain Left side acute onset severe pain.  Probable stuck L SI joint  Robaxin prn Norco prn Medrol  pack Ice/heat SI release exercises on YouTube Chiropractic treatment suggested      Sacroiliac joint pain - Primary   Left side acute onset severe pain.  Probable stuck L SI joint  Robaxin prn Norco prn Medrol  pack Ice/heat SI release exercises on YouTube Chiropractic treatment suggested          Meds ordered this encounter  Medications   methylPREDNISolone  (MEDROL  DOSEPAK) 4 MG TBPK tablet    Sig: As directed    Dispense:  21 tablet    Refill:  0   methocarbamol (ROBAXIN) 500 MG tablet    Sig: Take 1 tablet (500 mg total) by mouth every 8 (eight) hours as needed for muscle spasms.    Dispense:  30 tablet    Refill:  0   HYDROcodone -acetaminophen  (NORCO/VICODIN) 5-325 MG tablet    Sig: Take 1 tablet by mouth every 6 (six) hours as needed for severe pain (pain score 7-10).    Dispense:  20 tablet    Refill:  0      Follow-up: Return in about 3 months (around 08/04/2024).  Marolyn Noel, MD

## 2024-05-30 ENCOUNTER — Encounter: Payer: Self-pay | Admitting: *Deleted

## 2024-05-30 NOTE — Progress Notes (Signed)
 Chakia Counts                                          MRN: [id number]   05/30/2024   The [facility] Quality Team Specialist reviewed this patient medical record for the purposes of chart review for care gap closure. The following were reviewed: abstraction for care gap closure-controlling blood pressure.    [facility] Quality Team

## 2024-06-25 ENCOUNTER — Telehealth: Payer: Self-pay | Admitting: Pharmacy Technician

## 2024-06-25 ENCOUNTER — Other Ambulatory Visit (HOSPITAL_COMMUNITY): Payer: Self-pay

## 2024-06-25 NOTE — Telephone Encounter (Signed)
 Pharmacy Patient Advocate Encounter   Received notification from [employer] [facility] [facility] that prior authorization for Icosapent  Ethyl 1GM capsules is required/requested.   Insurance verification completed.   The patient is insured through [employer].   Per test claim: PA required; PA submitted to above mentioned insurance via Latent Key/confirmation #/EOC [facility] Status is pending

## 2024-06-30 ENCOUNTER — Other Ambulatory Visit (HOSPITAL_COMMUNITY): Payer: Self-pay

## 2024-06-30 NOTE — Telephone Encounter (Signed)
 Pharmacy Patient Advocate Encounter  Received notification from [facility] that Prior Authorization for  Icosapent  Ethyl 1GM capsules has been DENIED.  Full denial letter will be uploaded to the media tab. See denial reason below.   PA #/Case ID/Reference #: [id number]

## 2024-07-03 ENCOUNTER — Other Ambulatory Visit (HOSPITAL_COMMUNITY): Payer: Self-pay

## 2024-07-03 ENCOUNTER — Telehealth: Payer: Self-pay

## 2024-07-03 NOTE — Telephone Encounter (Signed)
 error

## 2024-08-11 ENCOUNTER — Ambulatory Visit: Admitting: Internal Medicine
